# Patient Record
Sex: Male | Born: 1935 | Race: White | Hispanic: No | Marital: Married | Smoking: Former smoker
Health system: Southern US, Community
[De-identification: ages and names within clinical notes are randomized; demographics above are authoritative.]

## PROBLEM LIST (undated history)

## (undated) DIAGNOSIS — K429 Umbilical hernia without obstruction or gangrene: Secondary | ICD-10-CM

## (undated) DIAGNOSIS — G709 Myoneural disorder, unspecified: Secondary | ICD-10-CM

## (undated) DIAGNOSIS — H9190 Unspecified hearing loss, unspecified ear: Secondary | ICD-10-CM

## (undated) DIAGNOSIS — Z972 Presence of dental prosthetic device (complete) (partial): Secondary | ICD-10-CM

## (undated) DIAGNOSIS — I10 Essential (primary) hypertension: Secondary | ICD-10-CM

## (undated) DIAGNOSIS — K219 Gastro-esophageal reflux disease without esophagitis: Secondary | ICD-10-CM

## (undated) DIAGNOSIS — N200 Calculus of kidney: Secondary | ICD-10-CM

## (undated) DIAGNOSIS — M5126 Other intervertebral disc displacement, lumbar region: Secondary | ICD-10-CM

## (undated) DIAGNOSIS — Z87442 Personal history of urinary calculi: Secondary | ICD-10-CM

## (undated) DIAGNOSIS — K08109 Complete loss of teeth, unspecified cause, unspecified class: Secondary | ICD-10-CM

## (undated) DIAGNOSIS — M1990 Unspecified osteoarthritis, unspecified site: Secondary | ICD-10-CM

## (undated) DIAGNOSIS — T8859XA Other complications of anesthesia, initial encounter: Secondary | ICD-10-CM

## (undated) DIAGNOSIS — J189 Pneumonia, unspecified organism: Secondary | ICD-10-CM

## (undated) DIAGNOSIS — E119 Type 2 diabetes mellitus without complications: Secondary | ICD-10-CM

## (undated) DIAGNOSIS — Z973 Presence of spectacles and contact lenses: Secondary | ICD-10-CM

## (undated) DIAGNOSIS — T4145XA Adverse effect of unspecified anesthetic, initial encounter: Secondary | ICD-10-CM

## (undated) HISTORY — PX: CATARACT EXTRACTION W/ INTRAOCULAR LENS  IMPLANT, BILATERAL: SHX1307

## (undated) HISTORY — PX: MULTIPLE TOOTH EXTRACTIONS: SHX2053

## (undated) HISTORY — PX: NOSE SURGERY: SHX723

## (undated) HISTORY — PX: BACK SURGERY: SHX140

## (undated) HISTORY — PX: COLONOSCOPY W/ BIOPSIES AND POLYPECTOMY: SHX1376

## (undated) HISTORY — PX: OTHER SURGICAL HISTORY: SHX169

---

## 2014-04-08 ENCOUNTER — Other Ambulatory Visit: Payer: Self-pay | Admitting: Neurosurgery

## 2014-04-08 DIAGNOSIS — M48061 Spinal stenosis, lumbar region without neurogenic claudication: Secondary | ICD-10-CM

## 2014-04-21 ENCOUNTER — Ambulatory Visit
Admission: RE | Admit: 2014-04-21 | Discharge: 2014-04-21 | Disposition: A | Discharge status: Home / Self Care | Payer: Medicare Other | Source: Ambulatory Visit | Attending: Neurosurgery | Admitting: Neurosurgery

## 2014-04-21 ENCOUNTER — Ambulatory Visit
Admission: RE | Admit: 2014-04-21 | Discharge: 2014-04-21 | Disposition: A | Discharge status: Home / Self Care | Payer: Self-pay | Source: Ambulatory Visit | Attending: Neurosurgery | Admitting: Neurosurgery

## 2014-04-21 DIAGNOSIS — M48061 Spinal stenosis, lumbar region without neurogenic claudication: Secondary | ICD-10-CM

## 2014-04-21 MED ORDER — DIAZEPAM 5 MG PO TABS
5.0000 mg | ORAL_TABLET | Freq: Once | ORAL | Status: AC
  Administered 2014-04-21: 5 mg via ORAL

## 2014-04-21 MED ORDER — IOHEXOL 180 MG/ML  SOLN
15.0000 mL | Freq: Once | INTRAMUSCULAR | Status: AC | PRN
  Administered 2014-04-21: 15 mL via INTRATHECAL

## 2014-04-21 NOTE — Discharge Instructions (Signed)

## 2014-04-30 ENCOUNTER — Other Ambulatory Visit (HOSPITAL_COMMUNITY): Payer: Self-pay | Admitting: Neurosurgery

## 2014-05-14 ENCOUNTER — Encounter (HOSPITAL_COMMUNITY): Payer: Self-pay

## 2014-05-14 ENCOUNTER — Encounter (HOSPITAL_COMMUNITY)
Admission: RE | Admit: 2014-05-14 | Discharge: 2014-05-14 | Disposition: A | Discharge status: Home / Self Care | Payer: Medicare Other | Source: Ambulatory Visit | Attending: Neurosurgery | Admitting: Neurosurgery

## 2014-05-14 ENCOUNTER — Inpatient Hospital Stay (HOSPITAL_COMMUNITY): Admission: RE | Admit: 2014-05-14 | Payer: Medicare Other | Source: Ambulatory Visit | Admitting: Neurosurgery

## 2014-05-14 DIAGNOSIS — Z029 Encounter for administrative examinations, unspecified: Secondary | ICD-10-CM

## 2014-05-14 HISTORY — DX: Umbilical hernia without obstruction or gangrene: K42.9

## 2014-05-14 HISTORY — DX: Unspecified osteoarthritis, unspecified site: M19.90

## 2014-05-14 HISTORY — DX: Myoneural disorder, unspecified: G70.9

## 2014-05-14 HISTORY — DX: Essential (primary) hypertension: I10

## 2014-05-14 HISTORY — DX: Unspecified hearing loss, unspecified ear: H91.90

## 2014-05-14 HISTORY — DX: Calculus of kidney: N20.0

## 2014-05-14 HISTORY — DX: Gastro-esophageal reflux disease without esophagitis: K21.9

## 2014-05-14 HISTORY — DX: Presence of spectacles and contact lenses: Z97.3

## 2014-05-14 LAB — CBC
HCT: 46.8 % (ref 39.0–52.0)
Hemoglobin: 16.2 g/dL (ref 13.0–17.0)
MCH: 32.9 pg (ref 26.0–34.0)
MCHC: 34.6 g/dL (ref 30.0–36.0)
MCV: 94.9 fL (ref 78.0–100.0)
PLATELETS: 232 10*3/uL (ref 150–400)
RBC: 4.93 MIL/uL (ref 4.22–5.81)
RDW: 12.6 % (ref 11.5–15.5)
WBC: 8.6 10*3/uL (ref 4.0–10.5)

## 2014-05-14 LAB — SURGICAL PCR SCREEN
MRSA, PCR: NEGATIVE
STAPHYLOCOCCUS AUREUS: NEGATIVE

## 2014-05-14 LAB — TYPE AND SCREEN
ABO/RH(D): A POS
Antibody Screen: NEGATIVE

## 2014-05-14 LAB — BASIC METABOLIC PANEL
Anion gap: 10 (ref 5–15)
BUN: 23 mg/dL (ref 6–23)
CALCIUM: 9.2 mg/dL (ref 8.4–10.5)
CO2: 25 mmol/L (ref 19–32)
Chloride: 101 mmol/L (ref 96–112)
Creatinine, Ser: 0.77 mg/dL (ref 0.50–1.35)
GFR calc Af Amer: >90 mL/min (ref >90)
GFR calc non Af Amer: 85 mL/min — ABNORMAL LOW (ref >90)
GLUCOSE: 166 mg/dL — AB (ref 70–99)
POTASSIUM: 4 mmol/L (ref 3.5–5.1)
SODIUM: 136 mmol/L (ref 135–145)

## 2014-05-14 LAB — ABO/RH: ABO/RH(D): A POS

## 2014-05-14 NOTE — Progress Notes (Signed)
Pt denies SOB and chest pain but is under the care of Dr. Fransico MichaelLeverne Sobiech of [facility]. Pt denies having a cardiac cath but did have a stress test and echo; records requested. Pt denies having a PCP at this time; unable to make PCP aware of STOP-BANG Sleep Apnea screening results. Pt chart forwarded to [place], [place] ( anesthesia) for review of EKG.

## 2014-05-14 NOTE — Pre-Procedure Instructions (Addendum)
Reginald HolidayJames Santillana  05/14/2014   Your procedure is scheduled on:  Wednesday, May 21, 2014  [facility] Admitting at 9:45 AM.( per MD)  Call this number if you have problems the morning of surgery: [phone]   Remember:   Do not eat food or drink liquids after midnight Tuesday, May 20, 2014   Take these medicines the morning of surgery with A SIP OF WATER: fexofenadine (ALLEGRA),  HYDROcodone-acetaminophen (NORCO/VICODIN), omeprazole (PRILOSEC),  if needed: Acetaminophen (TYLENOL ARTHRITIS PAIN PO)  Stop taking Aspirin, vitamins and herbal medications. Do not take any NSAIDs ie: Ibuprofen, Advil, Naproxen or any medication containing Aspirin; stop now   Do not wear jewelry, make-up or nail polish.  Do not wear lotions, powders, or perfumes. You may not wear deodorant.  Do not shave 48 hours prior to surgery. Men may shave face and neck.  Do not bring valuables to the hospital.  [facility] is not responsible for any belongings or valuables.               Contacts, dentures or bridgework may not be worn into surgery.  Leave suitcase in the car. After surgery it may be brought to your room.  For patients admitted to the hospital, discharge time is determined by your treatment team.               Patients discharged the day of surgery will not be allowed to drive home.  Name and phone number of your driver:   Special Instructions:  Special Instructions:Special Instructions: [facility] - Preparing for Surgery  Before surgery, you can play an important role.  Because skin is not sterile, your skin needs to be as free of germs as possible.  You can reduce the number of germs on you skin by washing with CHG (chlorahexidine gluconate) soap before surgery.  CHG is an antiseptic cleaner which kills germs and bonds with the skin to continue killing germs even after washing.  Please DO NOT use if you have an allergy to CHG or antibacterial soaps.  If your skin becomes  reddened/irritated stop using the CHG and inform your nurse when you arrive at Short Stay.  Do not shave (including legs and underarms) for at least 48 hours prior to the first CHG shower.  You may shave your face.  Please follow these instructions carefully:   1.  Shower with CHG Soap the night before surgery and the morning of Surgery.  2.  If you choose to wash your hair, wash your hair first as usual with your normal shampoo.  3.  After you shampoo, rinse your hair and body thoroughly to remove the Shampoo.  4.  Use CHG as you would any other liquid soap.  You can apply chg directly  to the skin and wash gently with scrungie or a clean washcloth.  5.  Apply the CHG Soap to your body ONLY FROM THE NECK DOWN.  Do not use on open wounds or open sores.  Avoid contact with your eyes, ears, mouth and genitals (private parts).  Wash genitals (private parts) with your normal soap.  6.  Wash thoroughly, paying special attention to the area where your surgery will be performed.  7.  Thoroughly rinse your body with warm water from the neck down.  8.  DO NOT shower/wash with your normal soap after using and rinsing off the CHG Soap.  9.  Pat yourself dry with a clean towel.  10.  Wear clean pajamas.            11.  Place clean sheets on your bed the night of your first shower and do not sleep with pets.  Day of Surgery  Do not apply any lotions/deodorants the morning of surgery.  Please wear clean clothes to the hospital/surgery center.   Please read over the following fact sheets that you were given: Pain Booklet, Coughing and Deep Breathing, Blood Transfusion Information, MRSA Information and Surgical Site Infection Prevention

## 2014-05-14 NOTE — Progress Notes (Signed)
   05/14/14 1327  OBSTRUCTIVE SLEEP APNEA  Have you ever been diagnosed with sleep apnea through a sleep study? No  Do you snore loudly (loud enough to be heard through closed doors)?  0  Do you often feel tired, fatigued, or sleepy during the daytime? 1  Has anyone observed you stop breathing during your sleep? 0  Do you have, or are you being treated for high blood pressure? 1  BMI more than 35 kg/m2? 0  Age over 77 years old? 1  Neck circumference greater than 40 cm/16 inches? 1  Gender: 1

## 2014-05-15 NOTE — Progress Notes (Signed)
Anesthesia Chart Review:  Reginald [name] is 79 year old male scheduled for L4-5 PLIF on 05/21/2014 with Dr. Lovell SheehanJenkins.   Cardiologist is Fransico MichaelLeverne Haverstick at [facility].   PMH includes: HTN, DM, PVD, GERD, hearing deficit. Former smoker. BMI 32.   Preoperative labs reviewed.    EKG 05/13/2014: NSR. LAD. RBBB. Possible lateral infarct, age undetermined. Inferior infarct, age undetermined. Per Dr. Scherrie GerlachFox's interpretation, no significant change since EKG tracing from 10/2013.    Nuclear stress test 11/19/2013: 1. Resting EKG reveals sinus rhythm with PVCs, first degree AV block and intermittent RBBB.  2. The LV EF was calculated at at 44%. Gated SPECT is normal. The EF was >50% by gross visual inpection.  3. Rest and stress perfusion images reveal diaphragm attenuation. This is felt to represent a normal study. There is no evidence for ischemia or prior infarction.   Echo 11/11/2013: 1. The LV size is normal. 2. There is mild concentric LVH 3. Overall LV systolic function is normal with an EF between 60-65% 4. The diastolic filling pattern indicates impaired relaxation.  5. The LA is normal in size by volume.  6. The RA is normal in size and function.  7. There is mild aortic valve sclerosis without stenosis 8. There is trace mitral regurgitation 9. Trace tricuspid regurgitation present.  10. There is no evidence of pulmonary hypertension.   Reginald [name] had stress and echo that were low risk after abnormal EKG last October. Reginald [name] has cardiac clearance for surgery from Dr. Caryn SectionFox.   If no changes, I anticipate Reginald [name] can proceed with surgery as scheduled.   Rica Mastngela Xin Klawitter, FNP-BC [facility]Anesthesiology Phone: [phone] 05/15/2014 1:51 PM

## 2014-05-20 MED ORDER — CEFAZOLIN SODIUM-DEXTROSE 2-3 GM-% IV SOLR
2.0000 g | INTRAVENOUS | Status: AC
  Administered 2014-05-21 (×2): 2 g via INTRAVENOUS
  Filled 2014-05-20: qty 50

## 2014-05-21 ENCOUNTER — Ambulatory Visit (HOSPITAL_COMMUNITY): Payer: Medicare Other

## 2014-05-21 ENCOUNTER — Inpatient Hospital Stay (HOSPITAL_COMMUNITY)
Admission: RE | Admit: 2014-05-21 | Discharge: 2014-05-23 | DRG: 460 | Disposition: A | Discharge status: Home / Self Care | Payer: Medicare Other | Source: Ambulatory Visit | Attending: Neurosurgery | Admitting: Neurosurgery

## 2014-05-21 ENCOUNTER — Ambulatory Visit (HOSPITAL_COMMUNITY): Payer: Medicare Other | Admitting: Emergency Medicine

## 2014-05-21 ENCOUNTER — Encounter (HOSPITAL_COMMUNITY)
Admission: RE | Disposition: A | Discharge status: Home / Self Care | Payer: Medicare Other | Source: Ambulatory Visit | Attending: Neurosurgery

## 2014-05-21 ENCOUNTER — Encounter (HOSPITAL_COMMUNITY): Payer: Self-pay | Admitting: *Deleted

## 2014-05-21 ENCOUNTER — Ambulatory Visit (HOSPITAL_COMMUNITY): Payer: Medicare Other | Admitting: Anesthesiology

## 2014-05-21 DIAGNOSIS — Z885 Allergy status to narcotic agent status: Secondary | ICD-10-CM

## 2014-05-21 DIAGNOSIS — Z7982 Long term (current) use of aspirin: Secondary | ICD-10-CM | POA: Diagnosis not present

## 2014-05-21 DIAGNOSIS — Z87442 Personal history of urinary calculi: Secondary | ICD-10-CM

## 2014-05-21 DIAGNOSIS — Z79891 Long term (current) use of opiate analgesic: Secondary | ICD-10-CM | POA: Diagnosis not present

## 2014-05-21 DIAGNOSIS — G709 Myoneural disorder, unspecified: Secondary | ICD-10-CM | POA: Diagnosis present

## 2014-05-21 DIAGNOSIS — H9190 Unspecified hearing loss, unspecified ear: Secondary | ICD-10-CM | POA: Diagnosis present

## 2014-05-21 DIAGNOSIS — Z79899 Other long term (current) drug therapy: Secondary | ICD-10-CM

## 2014-05-21 DIAGNOSIS — M1990 Unspecified osteoarthritis, unspecified site: Secondary | ICD-10-CM | POA: Diagnosis present

## 2014-05-21 DIAGNOSIS — I10 Essential (primary) hypertension: Secondary | ICD-10-CM | POA: Diagnosis present

## 2014-05-21 DIAGNOSIS — K219 Gastro-esophageal reflux disease without esophagitis: Secondary | ICD-10-CM | POA: Diagnosis present

## 2014-05-21 DIAGNOSIS — Z888 Allergy status to other drugs, medicaments and biological substances status: Secondary | ICD-10-CM

## 2014-05-21 DIAGNOSIS — M4806 Spinal stenosis, lumbar region: Secondary | ICD-10-CM | POA: Diagnosis present

## 2014-05-21 DIAGNOSIS — M5116 Intervertebral disc disorders with radiculopathy, lumbar region: Secondary | ICD-10-CM | POA: Diagnosis present

## 2014-05-21 DIAGNOSIS — Z87891 Personal history of nicotine dependence: Secondary | ICD-10-CM

## 2014-05-21 DIAGNOSIS — M47816 Spondylosis without myelopathy or radiculopathy, lumbar region: Secondary | ICD-10-CM

## 2014-05-21 DIAGNOSIS — M4316 Spondylolisthesis, lumbar region: Secondary | ICD-10-CM | POA: Diagnosis present

## 2014-05-21 HISTORY — DX: Other complications of anesthesia, initial encounter: T88.59XA

## 2014-05-21 HISTORY — DX: Adverse effect of unspecified anesthetic, initial encounter: T41.45XA

## 2014-05-21 SURGERY — POSTERIOR LUMBAR FUSION 1 LEVEL
Anesthesia: General | Site: Spine Lumbar

## 2014-05-21 MED ORDER — DIAZEPAM 5 MG PO TABS
5.0000 mg | ORAL_TABLET | Freq: Four times a day (QID) | ORAL | Status: DC | PRN
  Administered 2014-05-21 – 2014-05-23 (×4): 5 mg via ORAL
  Filled 2014-05-21 (×3): qty 1

## 2014-05-21 MED ORDER — BACITRACIN 50000 UNITS IM SOLR
INTRAMUSCULAR | Status: DC | PRN
  Administered 2014-05-21: 14:00:00

## 2014-05-21 MED ORDER — LACTATED RINGERS IV SOLN
INTRAVENOUS | Status: DC
  Administered 2014-05-21: 50 mL/h via INTRAVENOUS
  Administered 2014-05-21: 19:00:00 via INTRAVENOUS

## 2014-05-21 MED ORDER — QUINAPRIL HCL 10 MG PO TABS
40.0000 mg | ORAL_TABLET | Freq: Every day | ORAL | Status: DC
  Administered 2014-05-21 – 2014-05-23 (×3): 40 mg via ORAL
  Filled 2014-05-21 (×4): qty 4

## 2014-05-21 MED ORDER — EPHEDRINE SULFATE 50 MG/ML IJ SOLN
INTRAMUSCULAR | Status: AC
  Filled 2014-05-21: qty 1

## 2014-05-21 MED ORDER — PANTOPRAZOLE SODIUM 40 MG PO TBEC
80.0000 mg | DELAYED_RELEASE_TABLET | Freq: Every day | ORAL | Status: DC
  Administered 2014-05-21 – 2014-05-23 (×3): 80 mg via ORAL
  Filled 2014-05-21 (×3): qty 2

## 2014-05-21 MED ORDER — NEOSTIGMINE METHYLSULFATE 10 MG/10ML IV SOLN
INTRAVENOUS | Status: AC
  Filled 2014-05-21: qty 1

## 2014-05-21 MED ORDER — BACITRACIN ZINC 500 UNIT/GM EX OINT
TOPICAL_OINTMENT | CUTANEOUS | Status: DC | PRN
  Administered 2014-05-21: 1 via TOPICAL

## 2014-05-21 MED ORDER — GEMFIBROZIL 600 MG PO TABS
600.0000 mg | ORAL_TABLET | Freq: Two times a day (BID) | ORAL | Status: DC
  Administered 2014-05-22 – 2014-05-23 (×3): 600 mg via ORAL
  Filled 2014-05-21 (×5): qty 1

## 2014-05-21 MED ORDER — LACTATED RINGERS IV SOLN
INTRAVENOUS | Status: DC | PRN
  Administered 2014-05-21 (×5): via INTRAVENOUS

## 2014-05-21 MED ORDER — ALUM & MAG HYDROXIDE-SIMETH 200-200-20 MG/5ML PO SUSP: 30.0000 mL | Freq: Four times a day (QID) | ORAL | Status: DC | PRN

## 2014-05-21 MED ORDER — BUPIVACAINE-EPINEPHRINE (PF) 0.5% -1:200000 IJ SOLN
INTRAMUSCULAR | Status: DC | PRN
  Administered 2014-05-21: 10 mL

## 2014-05-21 MED ORDER — SUFENTANIL CITRATE 50 MCG/ML IV SOLN
INTRAVENOUS | Status: DC | PRN
  Administered 2014-05-21 (×2): 5 ug via INTRAVENOUS
  Administered 2014-05-21: 10 ug via INTRAVENOUS
  Administered 2014-05-21: 5 ug via INTRAVENOUS
  Administered 2014-05-21: 15 ug via INTRAVENOUS
  Administered 2014-05-21 (×2): 5 ug via INTRAVENOUS

## 2014-05-21 MED ORDER — GLYCOPYRROLATE 0.2 MG/ML IJ SOLN
INTRAMUSCULAR | Status: DC | PRN
  Administered 2014-05-21: .4 mg via INTRAVENOUS

## 2014-05-21 MED ORDER — SUFENTANIL CITRATE 50 MCG/ML IV SOLN
INTRAVENOUS | Status: AC
  Filled 2014-05-21: qty 1

## 2014-05-21 MED ORDER — VANCOMYCIN HCL 1000 MG IV SOLR
INTRAVENOUS | Status: DC | PRN
  Administered 2014-05-21: 1000 mg via TOPICAL

## 2014-05-21 MED ORDER — PHENYLEPHRINE HCL 10 MG/ML IJ SOLN
10.0000 mg | INTRAVENOUS | Status: DC | PRN
  Administered 2014-05-21: 80 ug/min via INTRAVENOUS
  Administered 2014-05-21: 40 ug/min via INTRAVENOUS

## 2014-05-21 MED ORDER — ONDANSETRON HCL 4 MG/2ML IJ SOLN
INTRAMUSCULAR | Status: DC | PRN
  Administered 2014-05-21: 4 mg via INTRAVENOUS

## 2014-05-21 MED ORDER — FENTANYL CITRATE (PF) 100 MCG/2ML IJ SOLN
INTRAMUSCULAR | Status: DC | PRN
  Administered 2014-05-21 (×2): 50 ug via INTRAVENOUS

## 2014-05-21 MED ORDER — ONDANSETRON HCL 4 MG/2ML IJ SOLN: 4.0000 mg | INTRAMUSCULAR | Status: DC | PRN

## 2014-05-21 MED ORDER — BUPIVACAINE LIPOSOME 1.3 % IJ SUSP
INTRAMUSCULAR | Status: DC | PRN
  Administered 2014-05-21: 20 mL

## 2014-05-21 MED ORDER — ACETAMINOPHEN 650 MG RE SUPP: 650.0000 mg | RECTAL | Status: DC | PRN

## 2014-05-21 MED ORDER — FENTANYL CITRATE (PF) 250 MCG/5ML IJ SOLN
INTRAMUSCULAR | Status: AC
  Filled 2014-05-21: qty 5

## 2014-05-21 MED ORDER — ONDANSETRON HCL 4 MG/2ML IJ SOLN
4.0000 mg | Freq: Once | INTRAMUSCULAR | Status: AC | PRN
  Administered 2014-05-21: 4 mg via INTRAVENOUS

## 2014-05-21 MED ORDER — LORATADINE 10 MG PO TABS
10.0000 mg | ORAL_TABLET | Freq: Every day | ORAL | Status: DC
  Administered 2014-05-21 – 2014-05-23 (×3): 10 mg via ORAL
  Filled 2014-05-21 (×3): qty 1

## 2014-05-21 MED ORDER — SODIUM CHLORIDE 0.9 % IJ SOLN
INTRAMUSCULAR | Status: AC
  Filled 2014-05-21: qty 10

## 2014-05-21 MED ORDER — HYDROMORPHONE HCL 1 MG/ML IJ SOLN
INTRAMUSCULAR | Status: AC
  Filled 2014-05-21: qty 1

## 2014-05-21 MED ORDER — ALBUMIN HUMAN 5 % IV SOLN
INTRAVENOUS | Status: DC | PRN
  Administered 2014-05-21 (×2): via INTRAVENOUS

## 2014-05-21 MED ORDER — ACETAMINOPHEN 10 MG/ML IV SOLN
INTRAVENOUS | Status: AC
  Administered 2014-05-21: 1000 mg
  Filled 2014-05-21: qty 100

## 2014-05-21 MED ORDER — 0.9 % SODIUM CHLORIDE (POUR BTL) OPTIME
TOPICAL | Status: DC | PRN
  Administered 2014-05-21: 1000 mL

## 2014-05-21 MED ORDER — LIDOCAINE HCL (CARDIAC) 20 MG/ML IV SOLN
INTRAVENOUS | Status: AC
  Filled 2014-05-21: qty 5

## 2014-05-21 MED ORDER — ONDANSETRON HCL 4 MG/2ML IJ SOLN
INTRAMUSCULAR | Status: AC
  Filled 2014-05-21: qty 2

## 2014-05-21 MED ORDER — SURGIFOAM 100 EX MISC
CUTANEOUS | Status: DC | PRN
  Administered 2014-05-21: 15:00:00 via TOPICAL

## 2014-05-21 MED ORDER — LIDOCAINE HCL (CARDIAC) 20 MG/ML IV SOLN
INTRAVENOUS | Status: DC | PRN
  Administered 2014-05-21: 40 mg via INTRAVENOUS

## 2014-05-21 MED ORDER — PROPOFOL 10 MG/ML IV BOLUS
INTRAVENOUS | Status: AC
  Filled 2014-05-21: qty 20

## 2014-05-21 MED ORDER — BUPIVACAINE LIPOSOME 1.3 % IJ SUSP
20.0000 mL | INTRAMUSCULAR | Status: AC
  Filled 2014-05-21: qty 20

## 2014-05-21 MED ORDER — SUCCINYLCHOLINE CHLORIDE 20 MG/ML IJ SOLN
INTRAMUSCULAR | Status: AC
  Filled 2014-05-21: qty 1

## 2014-05-21 MED ORDER — PROPOFOL 10 MG/ML IV BOLUS
INTRAVENOUS | Status: DC | PRN
  Administered 2014-05-21: 150 mg via INTRAVENOUS

## 2014-05-21 MED ORDER — ROCURONIUM BROMIDE 100 MG/10ML IV SOLN
INTRAVENOUS | Status: DC | PRN
  Administered 2014-05-21 (×4): 10 mg via INTRAVENOUS
  Administered 2014-05-21: 50 mg via INTRAVENOUS

## 2014-05-21 MED ORDER — ACETAMINOPHEN 325 MG PO TABS: 650.0000 mg | ORAL_TABLET | ORAL | Status: DC | PRN

## 2014-05-21 MED ORDER — NEOSTIGMINE METHYLSULFATE 10 MG/10ML IV SOLN
INTRAVENOUS | Status: DC | PRN
  Administered 2014-05-21: 3 mg via INTRAVENOUS

## 2014-05-21 MED ORDER — CEFAZOLIN SODIUM-DEXTROSE 2-3 GM-% IV SOLR
2.0000 g | Freq: Three times a day (TID) | INTRAVENOUS | Status: AC
  Administered 2014-05-21 – 2014-05-22 (×2): 2 g via INTRAVENOUS
  Filled 2014-05-21 (×2): qty 50

## 2014-05-21 MED ORDER — MONTELUKAST SODIUM 10 MG PO TABS
10.0000 mg | ORAL_TABLET | Freq: Every day | ORAL | Status: DC
  Administered 2014-05-21 – 2014-05-22 (×2): 10 mg via ORAL
  Filled 2014-05-21 (×3): qty 1

## 2014-05-21 MED ORDER — CHLORTHALIDONE 25 MG PO TABS
25.0000 mg | ORAL_TABLET | Freq: Every day | ORAL | Status: DC
  Administered 2014-05-21 – 2014-05-23 (×3): 25 mg via ORAL
  Filled 2014-05-21 (×3): qty 1

## 2014-05-21 MED ORDER — HYDROMORPHONE HCL 1 MG/ML IJ SOLN
0.2500 mg | INTRAMUSCULAR | Status: DC | PRN
  Administered 2014-05-21: 0.5 mg via INTRAVENOUS

## 2014-05-21 MED ORDER — ONDANSETRON HCL 4 MG/2ML IJ SOLN
INTRAMUSCULAR | Status: AC
  Administered 2014-05-21: 4 mg via INTRAVENOUS
  Filled 2014-05-21: qty 2

## 2014-05-21 MED ORDER — ROCURONIUM BROMIDE 50 MG/5ML IV SOLN
INTRAVENOUS | Status: AC
  Filled 2014-05-21: qty 2

## 2014-05-21 MED ORDER — DIAZEPAM 5 MG PO TABS
ORAL_TABLET | ORAL | Status: AC
  Administered 2014-05-21: 5 mg via ORAL
  Filled 2014-05-21: qty 1

## 2014-05-21 MED ORDER — PHENOL 1.4 % MT LIQD: 1.0000 | OROMUCOSAL | Status: DC | PRN

## 2014-05-21 MED ORDER — CHLORZOXAZONE 500 MG PO TABS
500.0000 mg | ORAL_TABLET | Freq: Two times a day (BID) | ORAL | Status: DC
  Administered 2014-05-21 – 2014-05-23 (×4): 500 mg via ORAL
  Filled 2014-05-21 (×5): qty 1

## 2014-05-21 MED ORDER — GLYCOPYRROLATE 0.2 MG/ML IJ SOLN
INTRAMUSCULAR | Status: AC
  Filled 2014-05-21: qty 2

## 2014-05-21 MED ORDER — LACTATED RINGERS IV SOLN: INTRAVENOUS | Status: DC

## 2014-05-21 MED ORDER — MORPHINE SULFATE 2 MG/ML IJ SOLN
1.0000 mg | INTRAMUSCULAR | Status: DC | PRN
  Administered 2014-05-21: 2 mg via INTRAVENOUS
  Administered 2014-05-22: 4 mg via INTRAVENOUS
  Filled 2014-05-21: qty 1
  Filled 2014-05-21: qty 2

## 2014-05-21 MED ORDER — HYDROMORPHONE HCL 2 MG PO TABS
4.0000 mg | ORAL_TABLET | ORAL | Status: DC | PRN
  Administered 2014-05-21 – 2014-05-23 (×6): 4 mg via ORAL
  Administered 2014-05-23: 2 mg via ORAL
  Filled 2014-05-21 (×7): qty 2

## 2014-05-21 MED ORDER — VANCOMYCIN HCL 1000 MG IV SOLR
INTRAVENOUS | Status: AC
  Filled 2014-05-21: qty 1000

## 2014-05-21 MED ORDER — MENTHOL 3 MG MT LOZG: 1.0000 | LOZENGE | OROMUCOSAL | Status: DC | PRN

## 2014-05-21 SURGICAL SUPPLY — 70 items
BAG DECANTER FOR FLEXI CONT (MISCELLANEOUS) ×3 IMPLANT
BENZOIN TINCTURE PRP APPL 2/3 (GAUZE/BANDAGES/DRESSINGS) ×3 IMPLANT
BLADE CLIPPER SURG (BLADE) ×3 IMPLANT
BRUSH SCRUB EZ PLAIN DRY (MISCELLANEOUS) ×3 IMPLANT
BUR MATCHSTICK NEURO 3.0 LAGG (BURR) ×3 IMPLANT
BUR PRECISION FLUTE 6.0 (BURR) ×3 IMPLANT
CANISTER SUCT 3000ML PPV (MISCELLANEOUS) ×3 IMPLANT
CAP REVERE LOCKING (Cap) ×12 IMPLANT
CLOSURE WOUND 1/2 X4 (GAUZE/BANDAGES/DRESSINGS) ×1
CONT SPEC 4OZ CLIKSEAL STRL BL (MISCELLANEOUS) ×3 IMPLANT
COVER BACK TABLE 60X90IN (DRAPES) ×3 IMPLANT
DRAPE C-ARM 42X72 X-RAY (DRAPES) ×6 IMPLANT
DRAPE LAPAROTOMY 100X72X124 (DRAPES) ×3 IMPLANT
DRAPE POUCH INSTRU U-SHP 10X18 (DRAPES) ×3 IMPLANT
DRAPE PROXIMA HALF (DRAPES) ×3 IMPLANT
DRAPE SURG 17X23 STRL (DRAPES) ×12 IMPLANT
ELECT BLADE 4.0 EZ CLEAN MEGAD (MISCELLANEOUS) ×3
ELECT REM PT RETURN 9FT ADLT (ELECTROSURGICAL) ×3
ELECTRODE BLDE 4.0 EZ CLN MEGD (MISCELLANEOUS) ×1 IMPLANT
ELECTRODE REM PT RTRN 9FT ADLT (ELECTROSURGICAL) ×1 IMPLANT
EVACUATOR 1/8 PVC DRAIN (DRAIN) IMPLANT
GAUZE SPONGE 4X4 12PLY STRL (GAUZE/BANDAGES/DRESSINGS) ×3 IMPLANT
GAUZE SPONGE 4X4 16PLY XRAY LF (GAUZE/BANDAGES/DRESSINGS) ×3 IMPLANT
GLOVE BIO SURGEON STRL SZ 6.5 (GLOVE) ×6 IMPLANT
GLOVE BIO SURGEON STRL SZ7 (GLOVE) ×6 IMPLANT
GLOVE BIO SURGEON STRL SZ8 (GLOVE) ×12 IMPLANT
GLOVE BIO SURGEON STRL SZ8.5 (GLOVE) ×9 IMPLANT
GLOVE BIO SURGEONS STRL SZ 6.5 (GLOVE) ×3
GLOVE BIOGEL PI IND STRL 7.0 (GLOVE) ×2 IMPLANT
GLOVE BIOGEL PI IND STRL 7.5 (GLOVE) ×2 IMPLANT
GLOVE BIOGEL PI INDICATOR 7.0 (GLOVE) ×4
GLOVE BIOGEL PI INDICATOR 7.5 (GLOVE) ×4
GLOVE ECLIPSE 7.5 STRL STRAW (GLOVE) ×9 IMPLANT
GLOVE EXAM NITRILE LRG STRL (GLOVE) IMPLANT
GLOVE EXAM NITRILE MD LF STRL (GLOVE) IMPLANT
GLOVE EXAM NITRILE XL STR (GLOVE) IMPLANT
GLOVE EXAM NITRILE XS STR PU (GLOVE) IMPLANT
GOWN STRL REUS W/ TWL LRG LVL3 (GOWN DISPOSABLE) IMPLANT
GOWN STRL REUS W/ TWL XL LVL3 (GOWN DISPOSABLE) ×4 IMPLANT
GOWN STRL REUS W/TWL 2XL LVL3 (GOWN DISPOSABLE) IMPLANT
GOWN STRL REUS W/TWL LRG LVL3 (GOWN DISPOSABLE)
GOWN STRL REUS W/TWL XL LVL3 (GOWN DISPOSABLE) ×8
KIT BASIN OR (CUSTOM PROCEDURE TRAY) ×3 IMPLANT
KIT ROOM TURNOVER OR (KITS) ×3 IMPLANT
NEEDLE HYPO 21X1.5 SAFETY (NEEDLE) IMPLANT
NEEDLE HYPO 22GX1.5 SAFETY (NEEDLE) ×3 IMPLANT
NS IRRIG 1000ML POUR BTL (IV SOLUTION) ×3 IMPLANT
PACK LAMINECTOMY NEURO (CUSTOM PROCEDURE TRAY) ×3 IMPLANT
PAD ARMBOARD 7.5X6 YLW CONV (MISCELLANEOUS) ×9 IMPLANT
PATTIES SURGICAL .5 X1 (DISPOSABLE) IMPLANT
PATTIES SURGICAL 1X1 (DISPOSABLE) ×3 IMPLANT
ROD REVERE 6.35 40MM (Rod) ×6 IMPLANT
SCREW REVERE 6.35 75X55MM (Screw) ×12 IMPLANT
SPACER ALTERA 10X31-8 (Spacer) ×3 IMPLANT
SPONGE LAP 4X18 X RAY DECT (DISPOSABLE) IMPLANT
SPONGE NEURO XRAY DETECT 1X3 (DISPOSABLE) IMPLANT
SPONGE SURGIFOAM ABS GEL 100 (HEMOSTASIS) ×3 IMPLANT
STRIP BIOACTIVE 10CC 25X100X4 (Miscellaneous) ×3 IMPLANT
STRIP BIOACTIVE 10CC 25X50X8 (Miscellaneous) ×6 IMPLANT
STRIP CLOSURE SKIN 1/2X4 (GAUZE/BANDAGES/DRESSINGS) ×2 IMPLANT
SUT VIC AB 1 CT1 18XBRD ANBCTR (SUTURE) ×2 IMPLANT
SUT VIC AB 1 CT1 8-18 (SUTURE) ×4
SUT VIC AB 2-0 CP2 18 (SUTURE) ×6 IMPLANT
SYR 20CC LL (SYRINGE) IMPLANT
SYR 20ML ECCENTRIC (SYRINGE) ×3 IMPLANT
TAPE CLOTH SURG 4X10 WHT LF (GAUZE/BANDAGES/DRESSINGS) ×3 IMPLANT
TOWEL OR 17X24 6PK STRL BLUE (TOWEL DISPOSABLE) ×3 IMPLANT
TOWEL OR 17X26 10 PK STRL BLUE (TOWEL DISPOSABLE) ×3 IMPLANT
TRAY FOLEY CATH 16FRSI W/METER (SET/KITS/TRAYS/PACK) ×3 IMPLANT
WATER STERILE IRR 1000ML POUR (IV SOLUTION) ×3 IMPLANT

## 2014-05-21 NOTE — Anesthesia Preprocedure Evaluation (Signed)
Anesthesia Evaluation  Patient identified by MRN, date of birth, ID band Patient awake    Reviewed: Allergy & Precautions, NPO status , Patient's Chart, lab work & pertinent test results  Airway Mallampati: II  TM Distance: >3 FB Neck ROM: Full    Dental  (+) Edentulous Upper, Edentulous Lower   Pulmonary former smoker,  breath sounds clear to auscultation        Cardiovascular hypertension, Rhythm:Regular     Neuro/Psych    GI/Hepatic   Endo/Other    Renal/GU      Musculoskeletal   Abdominal   Peds  Hematology   Anesthesia Other Findings   Reproductive/Obstetrics                             Anesthesia Physical Anesthesia Plan  ASA: II  Anesthesia Plan: General   Post-op Pain Management:    Induction: Intravenous  Airway Management Planned: Oral ETT  Additional Equipment:   Intra-op Plan:   Post-operative Plan:   Informed Consent: I have reviewed the patients History and Physical, chart, labs and discussed the procedure including the risks, benefits and alternatives for the proposed anesthesia with the patient or authorized representative who has indicated his/her understanding and acceptance.   Dental advisory given  Plan Discussed with: CRNA and Anesthesiologist  Anesthesia Plan Comments:         Anesthesia Quick Evaluation

## 2014-05-21 NOTE — Progress Notes (Signed)
Subjective:  The patient is somnolent but easily arousable. He is in no apparent distress. He looks well.  Objective: Vital signs in last 24 hours: Temp:  [97.5 F (36.4 C)] 97.5 F (36.4 C) (05/04 0934) Pulse Rate:  [89] 89 (05/04 0934) Resp:  [20] 20 (05/04 0934) BP: (173)/(82) 173/82 mmHg (05/04 0934) SpO2:  [95 %] 95 % (05/04 0934) Weight:  [94.405 kg (208 lb 2 oz)] 94.405 kg (208 lb 2 oz) (05/04 0934)  Intake/Output from previous day:   Intake/Output this shift: Total I/O In: 3200 [I.V.:2700; IV Piggyback:500] Out: 850 [Urine:400; Blood:450]  Physical exam the patient is somewhat but arousable. He is moving his lower extremities well.  Lab Results: No results for input(s): WBC, HGB, HCT, PLT in the last 72 hours. BMET No results for input(s): NA, K, CL, CO2, GLUCOSE, BUN, CREATININE, CALCIUM in the last 72 hours.  Studies/Results: Dg Lumbar Spine 2-3 Views  05/21/2014   CLINICAL DATA:  L4-L5 PLIF.  Initial encounter.  EXAM: DG C-ARM 61-120 MIN; LUMBAR SPINE - 2-3 VIEW  COMPARISON:  Intraoperative radiograph same date. Lumbar myelogram CT 04/21/2014.  FLUOROSCOPY TIME:  C-arm fluoroscopic images were obtained intraoperatively and submitted for post operative interpretation. Please see the performing provider's procedural report for the fluoroscopy time utilized.  FINDINGS: Two spot images of the lower lumbar spine are submitted from the operating room. Based on prior numbering, there is a transitional S1 segment. These views demonstrate the placement of bilateral pedicle screws and a prosthetic disc at L4-5. The interconnecting rods have not yet been placed.  IMPRESSION: Intraoperative views during L4-5 fusion. No demonstrated complication.   Electronically Signed   By: Carey BullocksWilliam  Veazey M.D.   On: 05/21/2014 17:35   Dg Lumbar Spine 1 View  05/21/2014   CLINICAL DATA:  Intraoperative localization for L4-5 PLIF  EXAM: LUMBAR SPINE - 1 VIEW  COMPARISON:  04/21/2014  FINDINGS: Lateral  radiograph of the lumbar spine was performed intraoperatively and reveals a surgical retractor and instrument at the posterior elements of L4 superiorly.  IMPRESSION: Intraoperative localization at L4   Electronically Signed   By: Alcide CleverMark  Lukens M.D.   On: 05/21/2014 16:34   Dg C-arm 1-60 Min  05/21/2014   CLINICAL DATA:  L4-L5 PLIF.  Initial encounter.  EXAM: DG C-ARM 61-120 MIN; LUMBAR SPINE - 2-3 VIEW  COMPARISON:  Intraoperative radiograph same date. Lumbar myelogram CT 04/21/2014.  FLUOROSCOPY TIME:  C-arm fluoroscopic images were obtained intraoperatively and submitted for post operative interpretation. Please see the performing provider's procedural report for the fluoroscopy time utilized.  FINDINGS: Two spot images of the lower lumbar spine are submitted from the operating room. Based on prior numbering, there is a transitional S1 segment. These views demonstrate the placement of bilateral pedicle screws and a prosthetic disc at L4-5. The interconnecting rods have not yet been placed.  IMPRESSION: Intraoperative views during L4-5 fusion. No demonstrated complication.   Electronically Signed   By: Carey BullocksWilliam  Veazey M.D.   On: 05/21/2014 17:35    Assessment/Plan: The patient is doing well. I spoke with his wife and son.      Kiandra Sanguinetti D 05/21/2014, 6:04 PM

## 2014-05-21 NOTE — Transfer of Care (Signed)
Immediate Anesthesia Transfer of Care Note  Patient: Reginald [name]  Procedure(s) Performed: Procedure(s) with comments: LUMBAR FOUR-FIVE POSTERIOR LUMBAR FUSION 1 LEVEL (N/A) - L45 posterior lumbar interbody fusion with interbody prosthesis posterior lateral arthrodesis and posterior nonsegmental instrumentation  Patient Location: PACU  Anesthesia Type:General  Level of Consciousness: awake, sedated and patient cooperative  Airway & Oxygen Therapy: Patient Spontanous Breathing and Patient connected to face mask oxygen  Post-op Assessment: Report given to RN, Post -op Vital signs reviewed and stable, Patient moving all extremities and Patient moving all extremities X 4  Post vital signs: Reviewed and stable  Last Vitals:  Filed Vitals:   05/21/14 0934  BP: 173/82  Pulse: 89  Temp: 36.4 C  Resp: 20    Complications: No apparent anesthesia complications

## 2014-05-21 NOTE — Anesthesia Procedure Notes (Signed)
Procedure Name: Intubation Date/Time: 05/21/2014 1:10 PM Performed by: Coralee RudFLORES, Penda Venturi Pre-anesthesia Checklist: Patient identified, Emergency Drugs available, Suction available and Patient being monitored Patient Re-evaluated:Patient Re-evaluated prior to inductionOxygen Delivery Method: Circle system utilized Preoxygenation: Pre-oxygenation with 100% oxygen Intubation Type: IV induction Ventilation: Mask ventilation without difficulty Laryngoscope Size: Mac and 4 Grade View: Grade I Tube type: Oral Tube size: 8.0 mm Number of attempts: 1 Airway Equipment and Method: Stylet Placement Confirmation: ETT inserted through vocal cords under direct vision,  positive ETCO2 and breath sounds checked- equal and bilateral Secured at: 22 cm Tube secured with: Tape Dental Injury: Teeth and Oropharynx as per pre-operative assessment

## 2014-05-21 NOTE — H&P (Signed)
Subjective: The patient is a 79 year old white male who has complained of back and leg pain consistent with neurogenic claudication. He has failed medical management and was worked up with a lumbar myelo CT. This demonstrated an L4-5 spondylolisthesis and spinal stenosis. I discussed the various treatment options with the patient including surgery. He has weighed the risks, benefits, and alternative surgery and decided proceed with an L4-5 decompression, instrumentation, and fusion.   Past Medical History  Diagnosis Date  . Hypertension   . GERD (gastroesophageal reflux disease)   . Wears glasses   . Hearing deficit     does not wear H/A  . Kidney stone   . Umbilical hernia   . Arthritis   . Neuromuscular disorder   . Complication of anesthesia     2014 BP DROPPED S/P BACK SURGERY REC. IV FLUIDS    Past Surgical History  Procedure Laterality Date  . Nose surgery      deviated septum, and blockage  . Rottor cuff repair       left   . Cataract extraction w/ intraocular lens  implant, bilateral    . Back surgery    . Colonoscopy w/ biopsies and polypectomy    . Multiple tooth extractions      Allergies  Allergen Reactions  . Acetaminophen Itching and Rash    Patient is tolerating Hydrocodone without difficulty.  . Naproxen Itching and Rash  . Roxicet [Oxycodone-Acetaminophen] Itching and Rash    History  Substance Use Topics  . Smoking status: Former Smoker    Types: Cigarettes  . Smokeless tobacco: Former NeurosurgeonUser    Types: Chew     Comment: Quit smoking  in 1987  . Alcohol Use: Yes    Family History  Problem Relation Age of Onset  . Stroke Mother   . Heart disease Father   . Aneurysm Father   . Aplastic anemia Sister   . Heart disease Other    Prior to Admission medications   Medication Sig Start Date End Date Taking? Authorizing Provider  Acetaminophen (TYLENOL ARTHRITIS PAIN PO) Take 600 mg by mouth 3 (three) times daily as needed (pain).   Yes Historical  Provider, MD  chlorthalidone (HYGROTON) 25 MG tablet Take 25 mg by mouth daily.   Yes Historical Provider, MD  Chlorzoxazone (LORZONE PO) Take 500 mg by mouth 2 (two) times daily.   Yes Historical Provider, MD  fexofenadine (ALLEGRA) 180 MG tablet Take 180 mg by mouth daily.   Yes Historical Provider, MD  gemfibrozil (LOPID) 600 MG tablet Take 600 mg by mouth 2 (two) times daily before a meal.   Yes Historical Provider, MD  HYDROcodone-acetaminophen (NORCO/VICODIN) 5-325 MG per tablet Take 1 tablet by mouth 3 (three) times daily.   Yes Historical Provider, MD  montelukast (SINGULAIR) 10 MG tablet Take 10 mg by mouth at bedtime.   Yes Historical Provider, MD  omeprazole (PRILOSEC) 40 MG capsule Take 40 mg by mouth daily.   Yes Historical Provider, MD  quinapril (ACCUPRIL) 40 MG tablet Take 40 mg by mouth daily.   Yes Historical Provider, MD  aspirin EC 81 MG tablet Take 81 mg by mouth daily.    Historical Provider, MD     Review of Systems  Positive ROS: As above  All other systems have been reviewed and were otherwise negative with the exception of those mentioned in the HPI and as above.  Objective: Vital signs in last 24 hours: Temp:  [97.5 F (36.4 C)] 97.5  F (36.4 C) (05/04 0934) Pulse Rate:  [89] 89 (05/04 0934) Resp:  [20] 20 (05/04 0934) BP: (173)/(82) 173/82 mmHg (05/04 0934) SpO2:  [95 %] 95 % (05/04 0934) Weight:  [94.405 kg (208 lb 2 oz)] 94.405 kg (208 lb 2 oz) (05/04 0934)  General Appearance: Alert, cooperative, no distress, Head: Normocephalic, without obvious abnormality, atraumatic Eyes: PERRL, conjunctiva/corneas clear, EOM's intact,    Ears: Normal  Throat: Normal  Neck: Supple, symmetrical, trachea midline, no adenopathy; thyroid: No enlargement/tenderness/nodules; no carotid bruit or JVD Back: Symmetric, no curvature, ROM normal, no CVA tenderness Lungs: Clear to auscultation bilaterally, respirations unlabored Heart: Regular rate and rhythm, no murmur, rub  or gallop Abdomen: Soft, non-tender,, no masses, no organomegaly Extremities: Extremities normal, atraumatic, no cyanosis or edema Pulses: 2+ and symmetric all extremities Skin: Skin color, texture, turgor normal, no rashes or lesions  NEUROLOGIC:   Mental status: alert and oriented, no aphasia, good attention span, Fund of knowledge/ memory ok Motor Exam - grossly normal Sensory Exam - grossly normal Reflexes:  Coordination - grossly normal Gait - grossly normal Balance - grossly normal Cranial Nerves: I: smell Not tested  II: visual acuity  OS: Normal  OD: Normal   II: visual fields Full to confrontation  II: pupils Equal, round, reactive to light  III,VII: ptosis None  III,IV,VI: extraocular muscles  Full ROM  V: mastication Normal  V: facial light touch sensation  Normal  V,VII: corneal reflex  Present  VII: facial muscle function - upper  Normal  VII: facial muscle function - lower Normal  VIII: hearing Not tested  IX: soft palate elevation  Normal  IX,X: gag reflex Present  XI: trapezius strength  5/5  XI: sternocleidomastoid strength 5/5  XI: neck flexion strength  5/5  XII: tongue strength  Normal    Data Review Lab Results  Component Value Date   WBC 8.6 05/14/2014   HGB 16.2 05/14/2014   HCT 46.8 05/14/2014   MCV 94.9 05/14/2014   PLT 232 05/14/2014   Lab Results  Component Value Date   NA 136 05/14/2014   K 4.0 05/14/2014   CL 101 05/14/2014   CO2 25 05/14/2014   BUN 23 05/14/2014   CREATININE 0.77 05/14/2014   GLUCOSE 166* 05/14/2014   No results found for: INR, PROTIME  Assessment/Plan: L4-5 spondylolisthesis, spinal stenosis, lumbago, lumbar radiculopathy, neurogenic claudication: I have discussed the situation with the patient. I have reviewed his imaging studies with him and pointed out the abnormalities. We have discussed the various treatment options including surgery. I have described the surgical treatment option of an L4-5 decompression,  instrumentation, and fusion. I have shown him surgical models. We have discussed the risks, benefits, alternatives, and likelihood of achieving goals with surgery. I have answered all the patient's questions. He has decided to proceed with surgery.   Tressie StalkerJENKINS,Rossi Burdo D 05/21/2014 12:47 PM

## 2014-05-21 NOTE — Op Note (Signed)
Brief history: The patient is a [age] year old white male who's had chronic back and leg pain. He's had a prior L4 laminectomy by another surgeon. He has had persistent and worsening back and leg pain. He has failed medical management and was worked up with a lumbar myelo CT. This demonstrated multilevel degenerative changes with stenosis most prominent at L4-5 and the spondylolisthesis. I discussed the various treatment options with the patient including surgery. He has weighed the risks, benefits, and alternative surgery and decided proceed with a L4-5 decompression, instrumentation, and fusion.  Preoperative diagnosis: L4-5 spondylolisthesis, Degenerative disc disease, spinal stenosis compressing both the L4 and the L5 nerve roots; lumbago; lumbar radiculopathy  Postoperative diagnosis: The same  Procedure: Redo L4 laminectomy/foraminotomies to decompress the bilateral L4 and L5 nerve roots(the work required to do this was in addition to the work required to do the posterior lumbar interbody fusion because of the patient's spinal stenosis, facet arthropathy. Etc. requiring a wide decompression of the nerve roots.); L4-5 posterior lumbar interbody fusion with local morselized autograft bone and Kinnex graft extender; insertion of interbody prosthesis at L4-5 (globus peek expandable interbody prosthesis); posterior nonsegmental instrumentation from L4 to L5 with globus titanium pedicle screws and rods; posterior lateral arthrodesis at L4-5 with local morselized autograft bone and Kinnex bone graft extender.  Surgeon: Dr. Delma OfficerJeff Maxmillian Carsey  Asst.: Dr. Daryl EasternGary Kram  Anesthesia: Gen. endotracheal  Estimated blood loss: 300 mL  Drains: One medium Hemovac  Complications: None  Description of procedure: The patient was brought to the operating room by the anesthesia team. General endotracheal anesthesia was induced. The patient was turned to the prone position on the Wilson frame. The patient's lumbosacral  region was then prepared with Betadine scrub and Betadine solution. Sterile drapes were applied.  I then injected the area to be incised with Marcaine with epinephrine solution. I then used the scalpel to make a linear midline incision over the L4-5 interspace, incising through the old surgical scar. I then used electrocautery to perform a bilateral subperiosteal dissection exposing the spinous process and lamina of L3, remainder of the L4 lamina, and L5. We then obtained intraoperative radiograph to confirm our location. We then inserted the Verstrac retractor to provide exposure.  I began the decompression by using the high speed drill to widen the prior L4 laminectomy. We then carefully dissected through the epidural scar tissue at L4. We used the Kerrison punches to remove the medial facets at L4-5. We performed wide foraminotomies about the bilateral L4 and L5 nerve roots completing the decompression.  We now turned our attention to the posterior lumbar interbody fusion. I used a scalpel to incise the intervertebral disc at L4-5 bilaterally. I then performed a partial intervertebral discectomy at L4-5 bilaterally using the pituitary forceps. We prepared the vertebral endplates at L4-5 bilaterally for the fusion by removing the soft tissues with the curettes. We then used the trial spacers to pick the appropriate sized interbody prosthesis. We prefilled his prosthesis with a combination of local morselized autograft bone that we obtained during the decompression as well as Kinnex bone graft extender. We inserted the prefilled prosthesis into the interspace at L4-5. There was a good snug fit of the prosthesis in the interspace. We then filled and the remainder of the intervertebral disc space with local morselized autograft bone and Kinnex. This completed the posterior lumbar interbody arthrodesis.  We now turned attention to the instrumentation. Under fluoroscopic guidance we cannulated the bilateral L4  and L5 pedicles with the  bone probe. We then removed the bone probe. We then tapped the pedicle with a 6.5 millimeter tap. We then removed the tap. We probed inside the tapped pedicle with a ball probe to rule out cortical breaches. We then inserted a 7.5 x 55 millimeter pedicle screw into the L4 and L5 pedicles bilaterally under fluoroscopic guidance. We then palpated along the medial aspect of the pedicles to rule out cortical breaches. There were none. The nerve roots were not injured. We then connected the unilateral pedicle screws with a lordotic rod. We compressed the construct and secured the rod in place with the caps. We then tightened the caps appropriately. This completed the instrumentation from L4-5.  We now turned our attention to the posterior lateral arthrodesis at L4-5. We used the high-speed drill to decorticate the remainder of the facets, pars, transverse process at L4-5. We then applied a combination of local morselized autograft bone and Kinnex bone graft extender over these decorticated posterior lateral structures. This completed the posterior lateral arthrodesis.  We then obtained hemostasis using bipolar electrocautery. We irrigated the wound out with bacitracin solution. We inspected the thecal sac and nerve roots and noted they were well decompressed. We then removed the retractor. We placed a medium Hemovac drain in the epidural space and tunneled out through separate stab wound. We placed vancomycin powder in the wound. We reapproximated patient's thoracolumbar fascia with interrupted #1 Vicryl suture. We reapproximated patient's subcutaneous tissue with interrupted 2-0 Vicryl suture. The reapproximated patient's skin with Steri-Strips and benzoin. The wound was then coated with bacitracin ointment. A sterile dressing was applied. The drapes were removed. The patient was subsequently returned to the supine position where they were extubated by the anesthesia team. He was then  transported to the post anesthesia care unit in stable condition. All sponge instrument and needle counts were reportedly correct at the end of this case.

## 2014-05-21 NOTE — Anesthesia Postprocedure Evaluation (Signed)
Anesthesia Post Note  Patient: Reginald HolidayJames Oran  Procedure(s) Performed: Procedure(s) (LRB): LUMBAR FOUR-FIVE POSTERIOR LUMBAR FUSION 1 LEVEL (N/A)  Anesthesia type: General  Patient location: PACU  Post pain: Pain level controlled and Adequate analgesia  Post assessment: Post-op Vital signs reviewed, Patient's Cardiovascular Status Stable, Respiratory Function Stable, Patent Airway and Pain level controlled  Last Vitals:  Filed Vitals:   05/21/14 1915  BP: 168/76  Pulse: 73  Temp:   Resp: 13    Post vital signs: Reviewed and stable  Level of consciousness: awake, alert  and oriented  Complications: No apparent anesthesia complications

## 2014-05-22 LAB — BASIC METABOLIC PANEL WITH GFR
Anion gap: 9 (ref 5–15)
BUN: 9 mg/dL (ref 6–20)
CO2: 26 mmol/L (ref 22–32)
Calcium: 8.4 mg/dL — ABNORMAL LOW (ref 8.9–10.3)
Chloride: 101 mmol/L (ref 101–111)
Creatinine, Ser: 0.72 mg/dL (ref 0.61–1.24)
GFR calc Af Amer: >60 mL/min
GFR calc non Af Amer: >60 mL/min
Glucose, Bld: 127 mg/dL — ABNORMAL HIGH (ref 70–99)
Potassium: 3.7 mmol/L (ref 3.5–5.1)
Sodium: 136 mmol/L (ref 135–145)

## 2014-05-22 LAB — CBC
HCT: 36.1 % — ABNORMAL LOW (ref 39.0–52.0)
Hemoglobin: 12.3 g/dL — ABNORMAL LOW (ref 13.0–17.0)
MCH: 32.4 pg (ref 26.0–34.0)
MCHC: 34.1 g/dL (ref 30.0–36.0)
MCV: 95 fL (ref 78.0–100.0)
PLATELETS: 176 10*3/uL (ref 150–400)
RBC: 3.8 MIL/uL — ABNORMAL LOW (ref 4.22–5.81)
RDW: 12.3 % (ref 11.5–15.5)
WBC: 9.4 10*3/uL (ref 4.0–10.5)

## 2014-05-22 LAB — GLUCOSE, CAPILLARY: Glucose-Capillary: 98 mg/dL (ref 70–99)

## 2014-05-22 NOTE — Evaluation (Signed)
Physical Therapy Evaluation Patient Details Name: Reginald [name] MRN: [id number] DOB: 10/18/1935 Today's Date: 05/22/2014   History of Present Illness  Patient is a 79 yo male s/p LUMBAR FOUR-FIVE POSTERIOR LUMBAR FUSION 1 LEVEL.  Clinical Impression  Patient demonstrates deficits in functional mobility as indicated below. Will benefit from continued skilled PT to address deficits and maximize function. Will see as indicated and progress as tolerated.     Follow Up Recommendations No PT follow up;Supervision/Assistance - 24 hour    Equipment Recommendations  None recommended by PT    Recommendations for Other Services       Precautions / Restrictions Precautions Precautions: Back Precaution Booklet Issued: Yes (comment) Precaution Comments: educated with teach back Required Braces or Orthoses: Spinal Brace Spinal Brace: Lumbar corset      Mobility  Bed Mobility Overal bed mobility: Needs Assistance Bed Mobility: Sit to Sidelying;Rolling Rolling: Min guard       Sit to sidelying: Min assist General bed mobility comments: VCs for sequencing and technique, assist for elevation of LEs back to bed  Transfers Overall transfer level: Needs assistance Equipment used: Rolling walker (2 wheeled) Transfers: Sit to/from Stand Sit to Stand: Min assist         General transfer comment: VCs for hand placement and safety, assist for stability and elevation to standing  Ambulation/Gait Ambulation/Gait assistance: Supervision;Min guard Ambulation Distance (Feet): 120 Feet Assistive device: Rolling walker (2 wheeled) Gait Pattern/deviations: Step-through pattern;Decreased stride length Gait velocity: decreased Gait velocity interpretation: Below normal speed for age/gender General Gait Details: slow but steady gait, increased pain during mobilization. Multiple standing rest breaks.   Stairs            Wheelchair Mobility    Modified Rankin (Stroke Patients Only)        Balance Overall balance assessment: Needs assistance Sitting-balance support: Feet supported Sitting balance-Leahy Scale: Fair     Standing balance support: During functional activity Standing balance-Leahy Scale: Fair Standing balance comment: heavy reliance on RW                             Pertinent Vitals/Pain Pain Assessment: 0-10 Pain Score: 8  Pain Location: back Pain Descriptors / Indicators: Aching;Sore Pain Intervention(s): Monitored during session;Repositioned    Home Living Family/patient expects to be discharged to:: Private residence Living Arrangements: Spouse/significant other Available Help at Discharge: Family Type of Home: House Home Access: Stairs to enter Entrance Stairs-Rails: None Entrance Stairs-Number of Steps: 2 Home Layout: One level Home Equipment: Environmental consultantWalker - 2 wheels;Cane - single point Additional Comments: walk in shower, elevated toilets    Prior Function Level of Independence: Independent               Hand Dominance   Dominant Hand: Right    Extremity/Trunk Assessment               Lower Extremity Assessment: Generalized weakness         Communication   Communication: No difficulties  Cognition Arousal/Alertness: Awake/alert Behavior During Therapy: WFL for tasks assessed/performed;Flat affect Overall Cognitive Status: Within Functional Limits for tasks assessed                      General Comments General comments (skin integrity, edema, etc.): educated extensively regarding precautions, mobility expectations, pain management and safety with precautions.     Exercises        Assessment/Plan    PT  Assessment Patient needs continued PT services  PT Diagnosis Difficulty walking;Generalized weakness;Acute pain   PT Problem List Decreased strength;Decreased range of motion;Decreased activity tolerance;Decreased balance;Decreased mobility;Pain  PT Treatment Interventions DME  instruction;Gait training;Stair training;Functional mobility training;Therapeutic activities;Therapeutic exercise;Balance training;Patient/family education   PT Goals (Current goals can be found in the Care Plan section) Acute Rehab PT Goals Patient Stated Goal: to go home PT Goal Formulation: With patient/family Time For Goal Achievement: 06/05/14 Potential to Achieve Goals: Good    Frequency Min 5X/week   Barriers to discharge        Co-evaluation               End of Session Equipment Utilized During Treatment: Gait belt;Back brace Activity Tolerance: Patient limited by fatigue;Patient limited by pain Patient left: in bed;with call bell/phone within reach;with family/visitor present Nurse Communication: Mobility status         Time: [id number] PT Time Calculation (min) (ACUTE ONLY): 25 min   Charges:   PT Evaluation $Initial PT Evaluation Tier I: 1 Procedure PT Treatments $Gait Training: 8-22 mins   PT G CodesFabio Asa:        Lanai Conlee J 05/22/2014, 11:16 AM Charlotte Crumbevon Saragrace Selke, PT DPT  [phone]

## 2014-05-22 NOTE — Progress Notes (Signed)
Patient ID: Reginald [name], male   DOB: 12/20/1935, 79 y.o.   MRN: [id number] Subjective:  The patient is alert and pleasant. He looks well. He is in no apparent distress. He is pleased with his progress. I spoke with his wife.  Objective: Vital signs in last 24 hours: Temp:  [97.7 F (36.5 C)-98.7 F (37.1 C)] 98.4 F (36.9 C) (05/05 1227) Pulse Rate:  [73-92] 92 (05/05 1227) Resp:  [12-20] 18 (05/05 1227) BP: (109-190)/(51-111) 119/61 mmHg (05/05 1227) SpO2:  [90 %-100 %] 91 % (05/05 1227)  Intake/Output from previous day: 05/04 0701 - 05/05 0700 In: 3320 [P.O.:120; I.V.:2700; IV Piggyback:500] Out: 1815 [Urine:1125; Drains:240; Blood:450] Intake/Output this shift: Total I/O In: 480 [P.O.:480] Out: 510 [Urine:400; Drains:110]  Physical exam the patient is alert and oriented 3. His strength is grossly normal in his lower extremities.  Lab Results:  Recent Labs  05/22/14 0248  WBC 9.4  HGB 12.3*  HCT 36.1*  PLT 176   BMET  Recent Labs  05/22/14 0248  NA 136  K 3.7  CL 101  CO2 26  GLUCOSE 127*  BUN 9  CREATININE 0.72  CALCIUM 8.4*    Studies/Results: Dg Lumbar Spine 2-3 Views  05/21/2014   CLINICAL DATA:  L4-L5 PLIF.  Initial encounter.  EXAM: DG C-ARM 61-120 MIN; LUMBAR SPINE - 2-3 VIEW  COMPARISON:  Intraoperative radiograph same date. Lumbar myelogram CT 04/21/2014.  FLUOROSCOPY TIME:  C-arm fluoroscopic images were obtained intraoperatively and submitted for post operative interpretation. Please see the performing provider's procedural report for the fluoroscopy time utilized.  FINDINGS: Two spot images of the lower lumbar spine are submitted from the operating room. Based on prior numbering, there is a transitional S1 segment. These views demonstrate the placement of bilateral pedicle screws and a prosthetic disc at L4-5. The interconnecting rods have not yet been placed.  IMPRESSION: Intraoperative views during L4-5 fusion. No demonstrated complication.    Electronically Signed   By: Carey BullocksWilliam  Veazey M.D.   On: 05/21/2014 17:35   Dg Lumbar Spine 1 View  05/21/2014   CLINICAL DATA:  Intraoperative localization for L4-5 PLIF  EXAM: LUMBAR SPINE - 1 VIEW  COMPARISON:  04/21/2014  FINDINGS: Lateral radiograph of the lumbar spine was performed intraoperatively and reveals a surgical retractor and instrument at the posterior elements of L4 superiorly.  IMPRESSION: Intraoperative localization at L4   Electronically Signed   By: Alcide CleverMark  Lukens M.D.   On: 05/21/2014 16:34   Dg C-arm 1-60 Min  05/21/2014   CLINICAL DATA:  L4-L5 PLIF.  Initial encounter.  EXAM: DG C-ARM 61-120 MIN; LUMBAR SPINE - 2-3 VIEW  COMPARISON:  Intraoperative radiograph same date. Lumbar myelogram CT 04/21/2014.  FLUOROSCOPY TIME:  C-arm fluoroscopic images were obtained intraoperatively and submitted for post operative interpretation. Please see the performing provider's procedural report for the fluoroscopy time utilized.  FINDINGS: Two spot images of the lower lumbar spine are submitted from the operating room. Based on prior numbering, there is a transitional S1 segment. These views demonstrate the placement of bilateral pedicle screws and a prosthetic disc at L4-5. The interconnecting rods have not yet been placed.  IMPRESSION: Intraoperative views during L4-5 fusion. No demonstrated complication.   Electronically Signed   By: Carey BullocksWilliam  Veazey M.D.   On: 05/21/2014 17:35    Assessment/Plan: Postop day #1: The patient is doing well. We will continue to mobilize him. We will discontinue his Hemovac drain. He will likely go home tomorrow.  LOS: 1  day     Letasha Kershaw D 05/22/2014, 2:16 PM

## 2014-05-22 NOTE — Progress Notes (Signed)
Utilization review completed.  

## 2014-05-22 NOTE — Progress Notes (Signed)
Orthopedic Tech Progress Note Patient Details:  Reginald HolidayJames Pociask 03/25/1935 [id number]  Patient ID: Reginald HolidayJames Sides, male   DOB: 07/10/1935, 79 y.o.   MRN: [id number] RN stated that pt already has aspen lumbar fusion brace. Nikki DomCrawford, [name] 05/22/2014, 7:52 AM

## 2014-05-23 MED ORDER — DIAZEPAM 2 MG PO TABS: 2.0000 mg | ORAL_TABLET | Freq: Four times a day (QID) | ORAL | Status: DC | PRN

## 2014-05-23 MED ORDER — HYDROMORPHONE HCL 2 MG PO TABS
2.0000 mg | ORAL_TABLET | ORAL | Status: DC | PRN
  Administered 2014-05-23: 2 mg via ORAL
  Filled 2014-05-23: qty 1

## 2014-05-23 MED ORDER — HYDROMORPHONE HCL 2 MG PO TABS: 2.0000 mg | ORAL_TABLET | ORAL | Status: DC | PRN

## 2014-05-23 MED FILL — Sodium Chloride IV Soln 0.9%: INTRAVENOUS | Qty: 1000 | Status: AC

## 2014-05-23 MED FILL — Heparin Sodium (Porcine) Inj 1000 Unit/ML: INTRAMUSCULAR | Qty: 30 | Status: AC

## 2014-05-23 NOTE — Progress Notes (Signed)
Physical Therapy Treatment Patient Details Name: Reginald HolidayJames [name] MRN: [id number] DOB: 09/16/1935 Today's Date: 05/23/2014    History of Present Illness Patient is a 79 yo male s/p LUMBAR FOUR-FIVE POSTERIOR LUMBAR FUSION 1 LEVEL.    PT Comments    Patient and wife benefit from review of bed mobility and from practicing stairs.  Wife states they went to outpatient PT after last surgery.  Would be helpful to to again when cleared by MD due to has gait abnormality and LE weakness.  No current follow up needs, however.  Follow Up Recommendations  No PT follow up;Supervision/Assistance - 24 hour     Equipment Recommendations  None recommended by PT    Recommendations for Other Services       Precautions / Restrictions Precautions Precautions: Back Precaution Booklet Issued: Yes (comment) Precaution Comments: reviewed Required Braces or Orthoses: Spinal Brace Spinal Brace: Lumbar corset;Applied in sitting position    Mobility  Bed Mobility Overal bed mobility: Needs Assistance Bed Mobility: Rolling;Sidelying to Sit Rolling: Min guard Sidelying to sit: Min assist     Sit to sidelying: Min assist General bed mobility comments: cues for technique with wife present to learn; assist to get hips all the way turned with rolling, bedrail down, so assist to lift trunk and cues to position hands (difficulty due to hump in bottom of bed,) assist to side with legs  Transfers Overall transfer level: Needs assistance Equipment used: Rolling walker (2 wheeled) Transfers: Sit to/from Stand Sit to Stand: Min assist         General transfer comment: lower surface of bed, cues for hand placement  Ambulation/Gait   Ambulation Distance (Feet): 120 Feet Assistive device: Rolling walker (2 wheeled) Gait Pattern/deviations: Decreased stride length;Shuffle     General Gait Details: reported already walking this morning down hall; to stairs close to his room, wife reports gait much improved from  prior to surgery due to was leaning over more and not picking up left foot well   Stairs Stairs: Yes Stairs assistance: Min assist Stair Management: Backwards;With walker Number of Stairs: 2 General stair comments: cues for technique and assist to hold walker  Wheelchair Mobility    Modified Rankin (Stroke Patients Only)       Balance Overall balance assessment: Needs assistance         Standing balance support: Bilateral upper extremity supported Standing balance-Leahy Scale: Poor Standing balance comment: needs walker for support                    Cognition Arousal/Alertness: Awake/alert Behavior During Therapy: WFL for tasks assessed/performed Overall Cognitive Status: Impaired/Different from baseline Area of Impairment: Memory     Memory: Decreased recall of precautions         General Comments: son reports returned to supine incorrectly; pt laying in bed in diagonal position    Exercises      General Comments General comments (skin integrity, edema, etc.): reviewed car transfers, precautions, walking for exercise at home; wife reports plans to go to outpatient PT when cleared to start per MD      Pertinent Vitals/Pain Pain Score: 7  Pain Location: back Pain Descriptors / Indicators: Aching Pain Intervention(s): Monitored during session;Patient requesting pain meds-RN notified    Home Living                      Prior Function            PT Goals (current  goals can now be found in the care plan section) Progress towards PT goals: Progressing toward goals    Frequency  Min 5X/week    PT Plan Current plan remains appropriate    Co-evaluation             End of Session Equipment Utilized During Treatment: Gait belt;Back brace Activity Tolerance: Patient tolerated treatment well Patient left: in bed;with call bell/phone within reach;with family/visitor present     Time: [id number] PT Time Calculation (min) (ACUTE  ONLY): 27 min  Charges:  $Gait Training: 8-22 mins $Self Care/Home Management: 8-22                    G Codes:      WYNN,CYNDI 05/23/2014, 10:29 AM  Sheran Lawlessyndi Wynn, PT [phone] 05/23/2014

## 2014-05-23 NOTE — Progress Notes (Signed)
Patient alert and oriented, mae's well, voiding adequate amount of urine, swallowing without difficulty, c/o mild pain and medication already given. Patient discharged home with family. Script and discharged instructions given to patient. Patient and family stated understanding of d/c instructions given and has an appointment with MD.  

## 2014-05-23 NOTE — Discharge Summary (Signed)
Physician Discharge Summary  Patient ID: Reginald HolidayJames Worthy MRN: [id number] DOB/AGE: 77/03/1935 77 y.o.  Admit date: 05/21/2014 Discharge date: 05/23/2014  Admission Diagnoses: L4-5 spondylolisthesis, spinal stenosis, lumbago, lumbar radiculopathy, neurogenic claudication  Discharge Diagnoses: The same Active Problems:   Spondylolisthesis of lumbar region   Discharged Condition: good  Hospital Course: I performed a L4-5 decompression, instrumentation, and fusion on the patient on 05/21/2014. The surgery went well.  The patient's postoperative course was unremarkable. His leg pain resolved with surgery. On postoperative day #2 the patient and his wife requested discharge to home. He was given oral and written discharge instructions. All his questions were answered.  Consults: PT Significant Diagnostic Studies: None Treatments: Redo L4-5 laminectomy, instrumentation, and fusion. Discharge Exam: Blood pressure 132/77, pulse 106, temperature 98.8 F (37.1 C), temperature source Oral, resp. rate 20, height 5\' 8"  (1.727 m), weight 94.405 kg (208 lb 2 oz), SpO2 91 %. The patient is alert and pleasant. His strength is normal his lower extremities. He looks well.  Disposition: Home  Discharge Instructions    Call MD for:  difficulty breathing, headache or visual disturbances    Complete by:  As directed      Call MD for:  extreme fatigue    Complete by:  As directed      Call MD for:  hives    Complete by:  As directed      Call MD for:  persistant dizziness or light-headedness    Complete by:  As directed      Call MD for:  persistant nausea and vomiting    Complete by:  As directed      Call MD for:  redness, tenderness, or signs of infection (pain, swelling, redness, odor or green/yellow discharge around incision site)    Complete by:  As directed      Call MD for:  severe uncontrolled pain    Complete by:  As directed      Call MD for:  temperature >100.4    Complete by:  As directed       Diet - low sodium heart healthy    Complete by:  As directed      Discharge instructions    Complete by:  As directed   Call [phone] for a followup appointment. Take a stool softener while you are using pain medications.     Driving Restrictions    Complete by:  As directed   Do not drive for 2 weeks.     Increase activity slowly    Complete by:  As directed      Lifting restrictions    Complete by:  As directed   Do not lift more than 5 pounds. No excessive bending or twisting.     May shower / Bathe    Complete by:  As directed   He may shower after the pain she is removed 3 days after surgery. Leave the incision alone.     Remove dressing in 24 hours    Complete by:  As directed             Medication List    STOP taking these medications        HYDROcodone-acetaminophen 5-325 MG per tablet  Commonly known as:  NORCO/VICODIN     TYLENOL ARTHRITIS PAIN PO      TAKE these medications        aspirin EC 81 MG tablet  Take 81 mg by mouth daily.  chlorthalidone 25 MG tablet  Commonly known as:  HYGROTON  Take 25 mg by mouth daily.     diazepam 2 MG tablet  Commonly known as:  VALIUM  Take 1 tablet (2 mg total) by mouth every 6 (six) hours as needed for muscle spasms.     fexofenadine 180 MG tablet  Commonly known as:  ALLEGRA  Take 180 mg by mouth daily.     gemfibrozil 600 MG tablet  Commonly known as:  LOPID  Take 600 mg by mouth 2 (two) times daily before a meal.     HYDROmorphone 2 MG tablet  Commonly known as:  DILAUDID  Take 1 tablet (2 mg total) by mouth every 4 (four) hours as needed for severe pain.     LORZONE PO  Take 500 mg by mouth 2 (two) times daily.     montelukast 10 MG tablet  Commonly known as:  SINGULAIR  Take 10 mg by mouth at bedtime.     omeprazole 40 MG capsule  Commonly known as:  PRILOSEC  Take 40 mg by mouth daily.     quinapril 40 MG tablet  Commonly known as:  ACCUPRIL  Take 40 mg by mouth daily.            Follow-up Information    Follow up with Cristi LoronJENKINS,Diondre Pulis D, MD.   Specialty:  Neurosurgery   Contact information:   [address] [place] [address] [phone]       Signed: Cristi LoronJENKINS,Tammara Massing D 05/23/2014, 10:02 AM

## 2014-05-23 NOTE — Progress Notes (Signed)
Orthopedic Tech Progress Note Patient Details:  Reginald [name] 12/05/1935 [id number]  Patient ID: Reginald [name], male   DOB: 12/08/1935, 79 y.o.   MRN: [id number] Called in bio-tech brace order; spoke with Reginald MaltaStephanie  Ahna [name] 05/23/2014, 9:54 AM

## 2015-05-23 IMAGING — CT CT L SPINE W/ CM
4 of 10 series · 12 of 34 positions shown, 13 images · non-contrast
Comparison: MRI 09/28/2013.

CLINICAL DATA: Lumbar spinal stenosis. Prior lumbar decompression
with recurrent symptoms. Six months of relief following initially
decompression.
TECHNIQUE: Contiguous axial images were obtained through the Lumbar spine after
the intrathecal infusion of infusion. Coronal and sagittal
reconstructions were obtained of the axial image sets.

[Series 4: l spine bone · axial · 0.27mm/px · z∈[-270,-190]mm · 2 of 96 slices shown]
[im 32/96  bone]
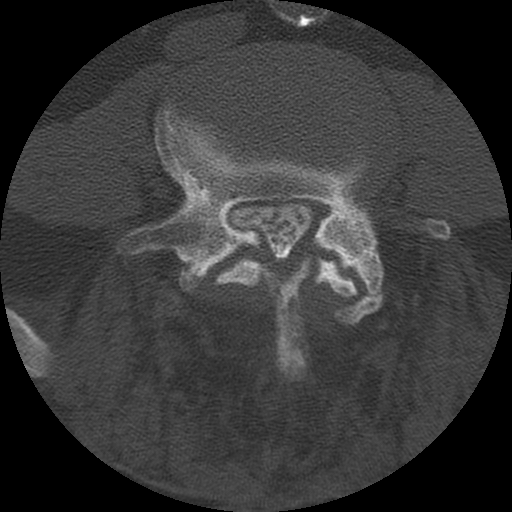
[im 64/96  bone]
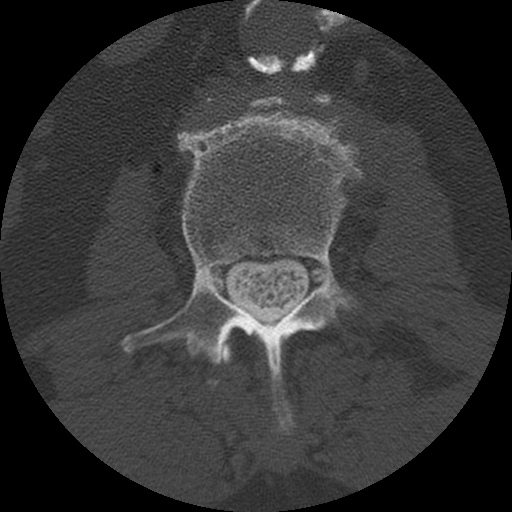

[Series 5: l spine detail · axial · 0.27mm/px · z∈[-290,-170]mm · 3 of 96 slices shown, 4 images]
[im 24/96  soft-tissue]
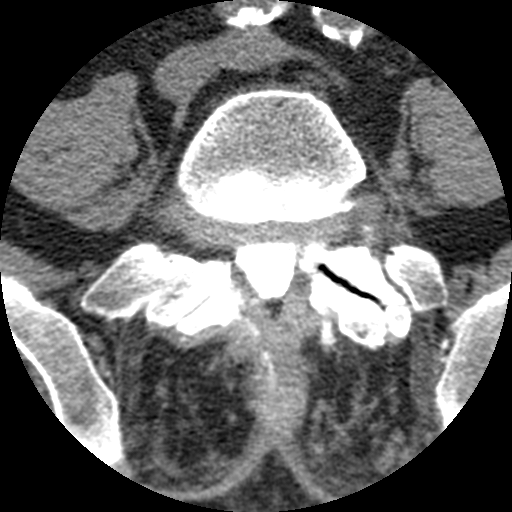
[im 24/96  bone]
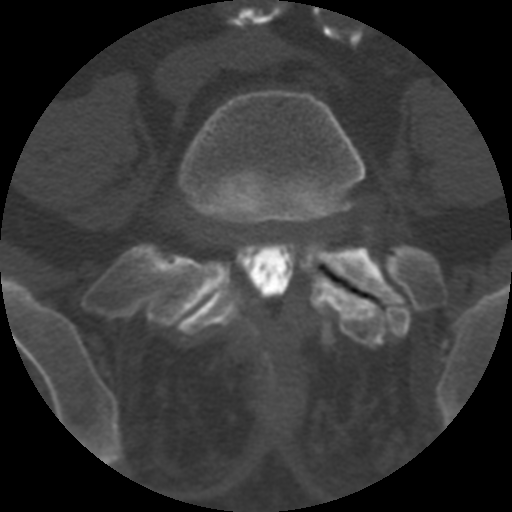
[im 48/96  bone]
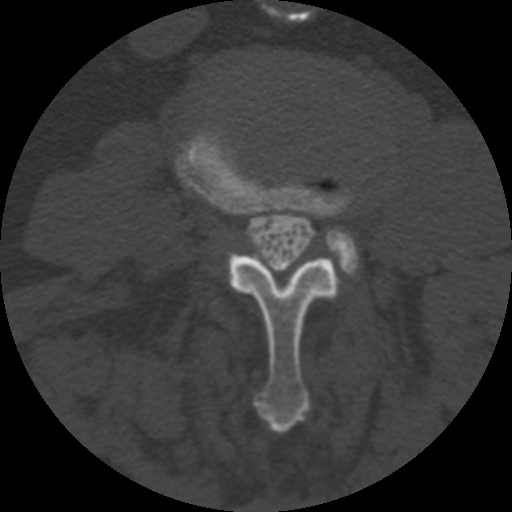
[im 72/96  bone]
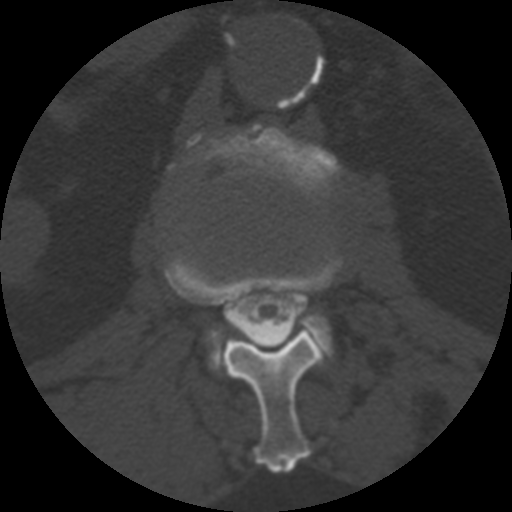

[Series 200: cor · coronal · 0.48mm/px · 4 of 47 slices shown]
[im 12/47  bone]
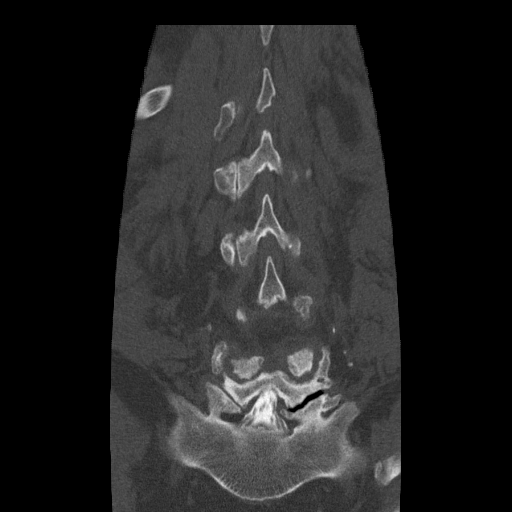
[im 13/47  soft-tissue]
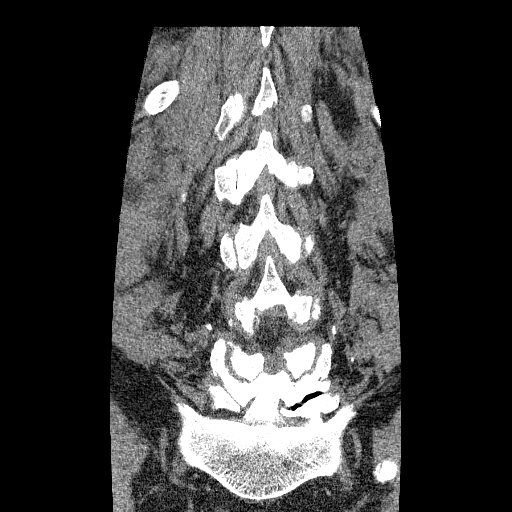
[im 24/47  bone]
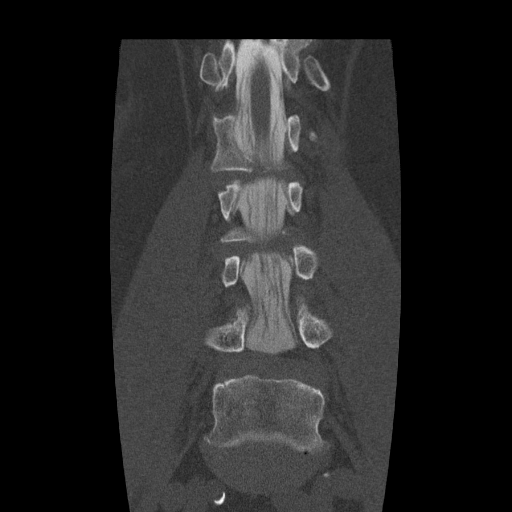
[im 35/47  bone]
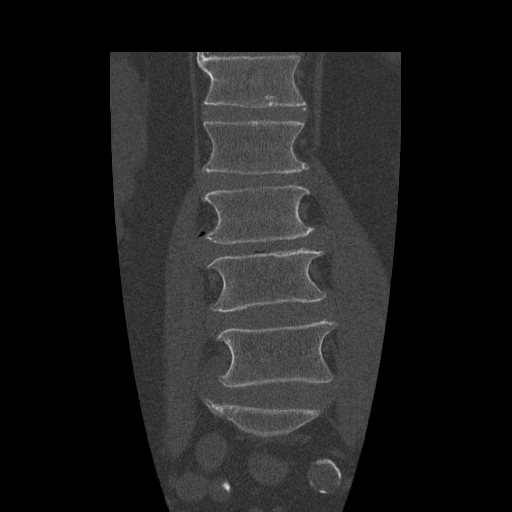

[Series 201: cor lower · coronal · 0.27mm/px · 3 of 47 slices shown]
[im 10/47  bone]
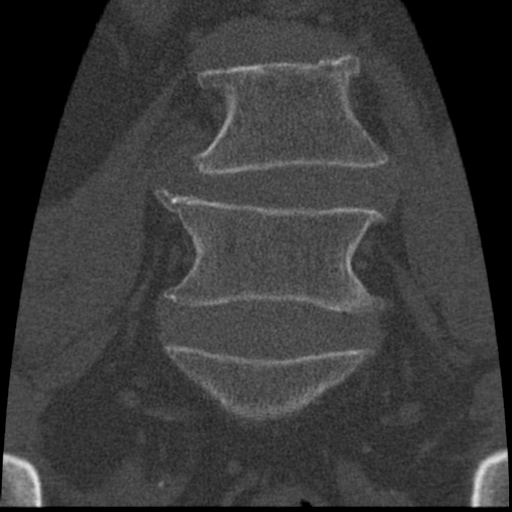
[im 19/47  bone]
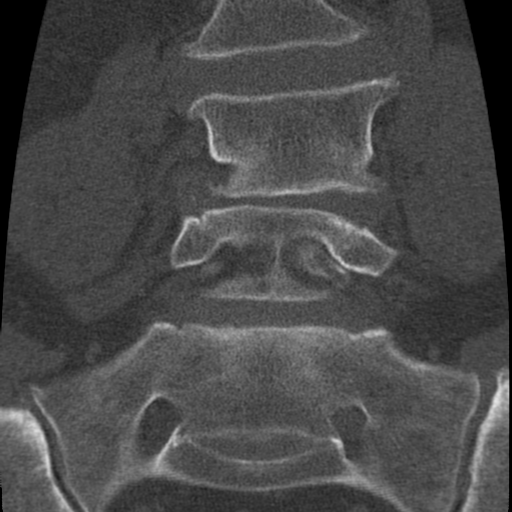
[im 28/47  bone]
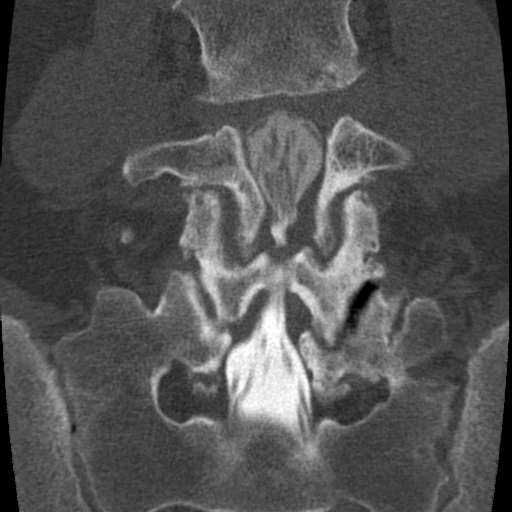

[12 of 34 positions shown; findings below may reference images not displayed]

EXAM:
LUMBAR MYELOGRAM

FLUOROSCOPY TIME:  2 minutes 6 seconds

Dose: 55 dHycmW

PROCEDURE:
After thorough discussion of risks and benefits of the procedure
including bleeding, infection, injury to nerves, blood vessels,
adjacent structures as well as headache and CSF leak, written and
oral informed consent was obtained. Consent was obtained by Dr.
Trisha Lizardo. Time out form was completed.

Patient was positioned prone on the fluoroscopy table. Local
anesthesia was provided with 1% lidocaine without epinephrine after
prepped and draped in the usual sterile fashion. Puncture was
performed at L2-L3 using a 3 1/2 inch 22-gauge spinal needle via
RIGHT paramedian approach. Using a single pass through the dura, the
needle was placed within the thecal sac, with return of clear CSF.
15 mL of Lmnipaque-I25 was injected into the thecal sac, with normal
opacification of the nerve roots and cauda equina consistent with
free flow within the subarachnoid space.

I personally performed the lumbar puncture and administered the
intrathecal contrast. I also personally supervised acquisition of
the myelogram images.
FINDINGS: LUMBAR MYELOGRAM FINDINGS:

There is lumbosacral transitional anatomy. This report assumes that
there are 5 lumbar type vertebral bodies. Recommend close
correlation with radiographs if intervention is elected.

S1 is solidly fused to the sacrum.On the standing upright frontal
images, there is a levoconvex curve with the apex at L4. The curve
is mild. There is leftward slip of L4 on L5 measuring 9 mm. This is
part of a rotoscoliosis. On the lateral view in the neutral
position, the most prominent spondylolisthesis is at L4-L5 and
L2-L3.

L2-L3 shows 7 mm of retrolisthesis in the neutral position.
Extension range of motion is poor and there is no pathologic motion
at L2-L3 with flexion and extension maneuvers.

3 mm of retrolisthesis of L3 on L4 is present which does not change
with flexion and extension maneuvers.

L4-L5 grade I anterolisthesis measures 6 mm in the upright neutral
position. There is no pathologic motion with flexion and extension
maneuvers with the anterolisthesis continuing to measure 6 mm.

Extensive aortoiliac atherosclerosis is present.

CT LUMBAR MYELOGRAM FINDINGS:

Segmentation: There is lumbosacral transitional anatomy. This report
assumes that there are 5 lumbar type vertebral bodies. Recommend
close correlation with radiographs if intervention is elected. The
numbering scheme used on the prior MRIs is unknown. Best visual
reference is laminectomy at L4. The S1 segment is solidly fused to
the sacrum. There are rudimentary L1 ribs bilaterally.

Alignment: Similar upright radiographs. Leftward slip of L4 on L5.
Grade I anterolisthesis of L4 on L5 is less pronounced by CT,
probably due to supine positioning. This is compatible with
instability between supine and standing positions. L2-L3
retrolisthesis appears similar.

Vertebrae: No destructive osseous lesions. Vertebral body height is
preserved.

Conus medullaris: Normal at L2.

Paraspinal tissues: Aortoiliac atherosclerosis. Bilateral SI joint
degenerative disease.

Disc levels:

T12-L1: Disc degeneration with vacuum disc anteriorly. Marginal
osteophytes. No ankylosis. Shallow posterior disc bulging. Mild
RIGHT facet arthrosis. Central canal and foramina appear patent.

L1-L2: Disc degeneration and Schmorl's nodes. Shallow broad-based
disc bulging. Moderate RIGHT facet arthrosis. Mild LEFT facet
arthrosis. Central canal and foramina appear adequately patent.

L2-L3: Disc degeneration and loss of height. Intercalary bone is
present anteriorly in the annulus. Vacuum disc is present. There is
a broad-based posterior disc extrusion with mild caudal migration of
disc material eccentric to the LEFT. This produces mild central
stenosis. Additionally, there is mild bilateral foraminal stenosis
associated with retrolisthesis. Facet joints are mildly distracted
secondary to the retrolisthesis. Nitrogen gas is present in the
RIGHT paraspinal soft tissues associated with circumferential disc
bulging. This is probably contained within the outer fibers of the
annulus.

L3-L4: Relative preservation of disc height. Vacuum disc. Mild
central stenosis. Marginal osteophytes are present. 3 mm of
retrolisthesis is similar. There is moderate RIGHT and mild LEFT
foraminal stenosis associated with posterior disc bulging and
retrolisthesis. Mild distraction of the facet joints. Minimal facet
degeneration. The subarticular zones appear patent.

L4-L5: L4 Laminectomy. Moderate recurrent central stenosis. Severe
facet arthrosis. Broad-based disc extrusion is present eccentric to
the RIGHT. The disc extrusion is most prominent in the RIGHT
foraminal region. Extruded disc has cranial migration and compresses
the RIGHT L4 nerve in the foramen. Moderate LEFT foraminal stenosis
due to broad-based disc bulging. Moderate to severe RIGHT facet
arthrosis with moderate LEFT facet arthrosis. Mass effect in the
LEFT subarticular zone associated with either scarring or residual
ligamentum flavum thickening/ redundancy. RIGHT subarticular
encroachment is also present without compression.

L5-S1: Mild disc degeneration and broad-based posterior disc
bulging. There is severe LEFT facet arthrosis with LEFT vacuum
joint. Moderate to severe RIGHT facet arthrosis. Central canal
appears patent. LEFT subarticular stenosis is present with medial
deviation of the descending LEFT S1 nerve. The RIGHT subarticular
zone appears patent. Moderate LEFT foraminal stenosis associated
with bulging disc and severe facet arthrosis with anterior facet
spurring. The RIGHT neural foramen appears patent. Heterotopic bone
is present around the inferior recess of the LEFT L5-S1 facet joint.
Degenerative locules of the nitrogen gas associated with
pseudoarthrosis with heterotopic bone.

S1-S2: Rudimentary disc without stenosis.
IMPRESSION: 1. Technically successful L2-L3 lumbar puncture for lumbar
myelogram.
2. L4 laminectomy with moderate recurrent central stenosis. Stenosis
is due to a combination of degenerative disc and facet disease.
There is bilateral foraminal stenosis due to broad-based disc
extrusion. The extrusion is most pronounced in the RIGHT foramen,
compressing the exiting RIGHT L4 nerve and producing severe RIGHT
L4-L5 foraminal stenosis.
3. L5-S1 LEFT-greater-than-RIGHT facet arthrosis with LEFT foraminal
stenosis. Mild LEFT subarticular stenosis with medial deviation of
the descending LEFT S1 nerve.
4. More mild degenerative disease at L1-L2, L2-L3 and L3-L4.

## 2015-06-22 IMAGING — RF DG LUMBAR SPINE 2-3V
1 series · 2 of 2 positions shown · non-contrast
Comparison: Intraoperative radiograph same date.

CLINICAL DATA: L4-L5 PLIF.  Initial encounter.

EXAM:
DG C-ARM 61-120 MIN; LUMBAR SPINE - 2-3 VIEW

[Series 1: run · 2 of 2 slices shown]
[im 1/2]
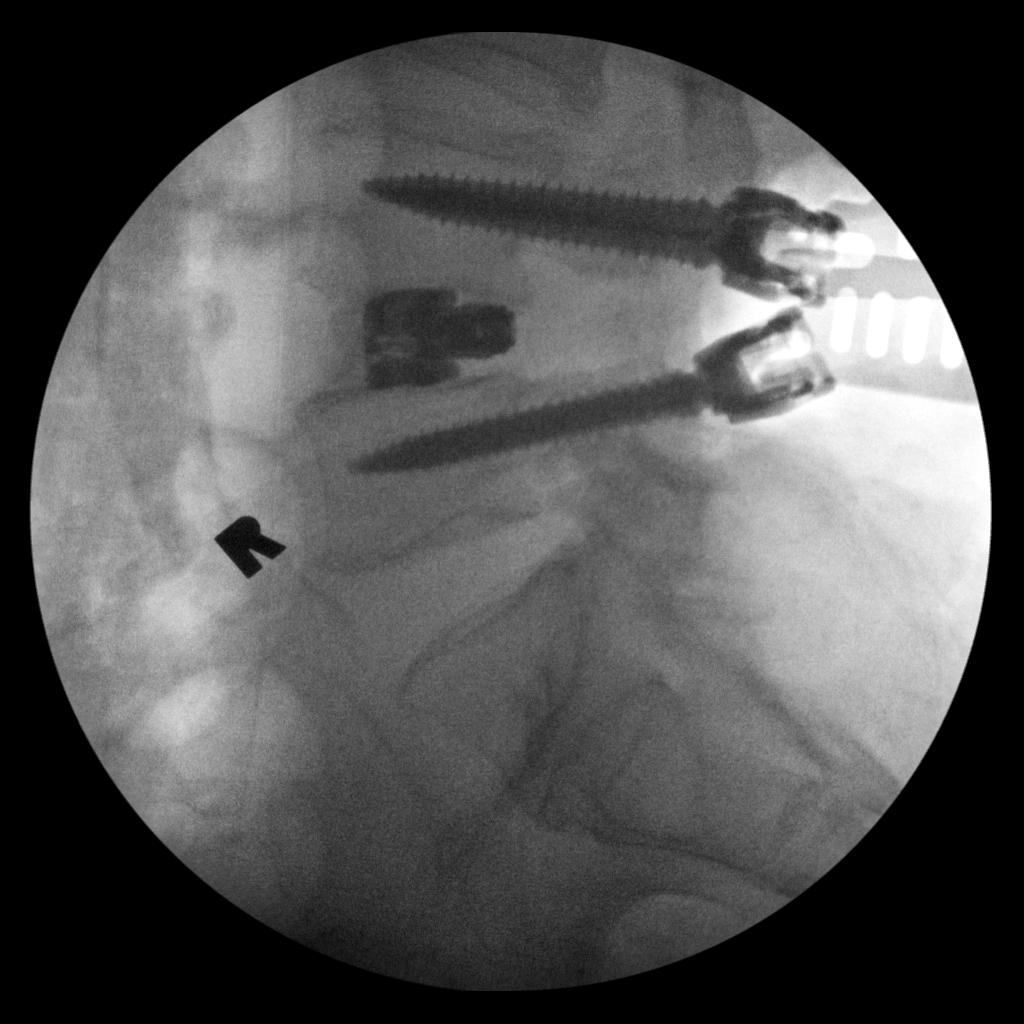
[im 2/2]
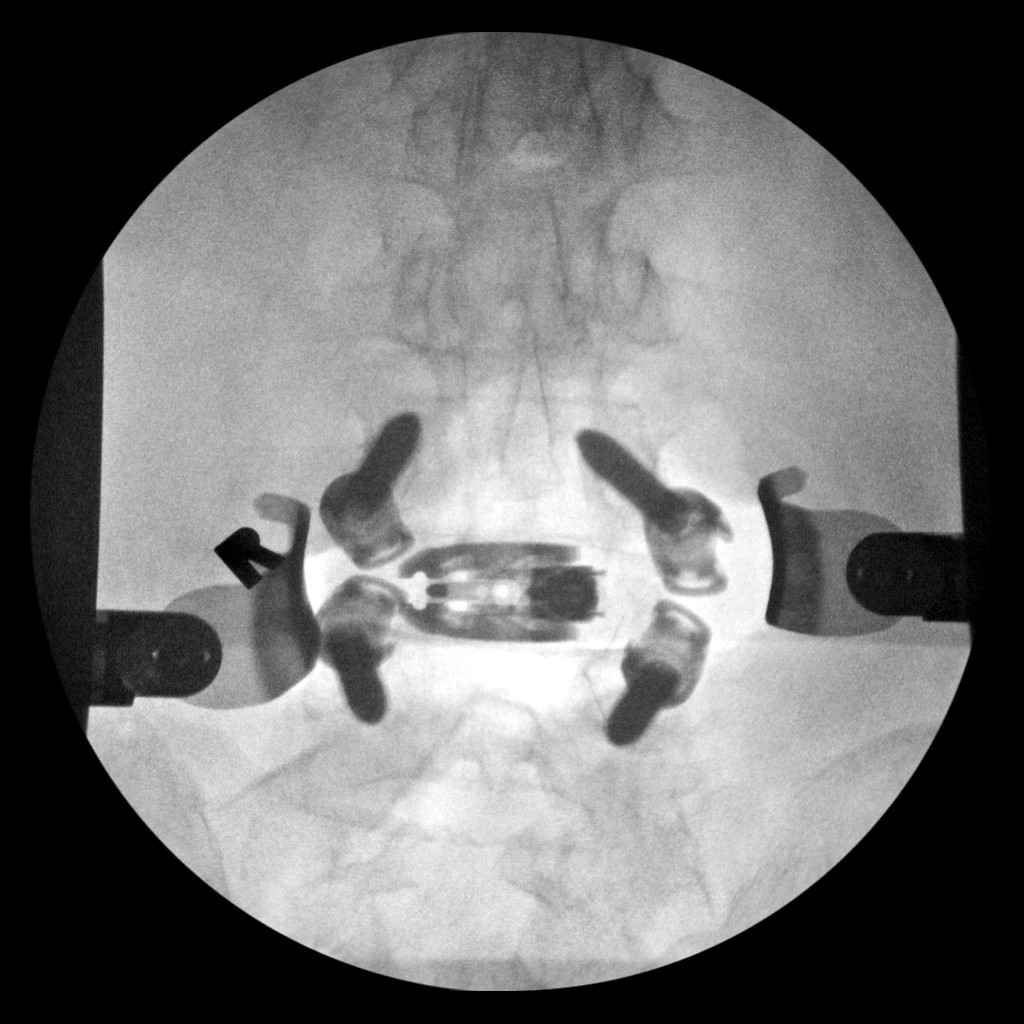

[2 of 2 positions shown; findings below may reference images not displayed]

Lumbar myelogram
CT 04/21/2014.

FLUOROSCOPY TIME:  C-arm fluoroscopic images were obtained
intraoperatively and submitted for post operative interpretation.
Please see the performing provider's procedural report for the
fluoroscopy time utilized.
FINDINGS: Two spot images of the lower lumbar spine are submitted from the
operating room. Based on prior numbering, there is a transitional S1
segment. These views demonstrate the placement of bilateral pedicle
screws and a prosthetic disc at L4-5. The interconnecting rods have
not yet been placed.
IMPRESSION: Intraoperative views during L4-5 fusion. No demonstrated
complication.

## 2016-08-26 ENCOUNTER — Encounter (HOSPITAL_COMMUNITY): Payer: Self-pay | Admitting: *Deleted

## 2016-08-26 ENCOUNTER — Other Ambulatory Visit: Payer: Self-pay | Admitting: Neurosurgery

## 2016-08-26 NOTE — Progress Notes (Signed)
Pt denies SOB, chest pain, and being under the care of a cardiologist. Pt denies having a cardiac cath and echo but stated that a stress test was performed and possibly an EKG (within the last year) ; records requested from Dr. Fransico MichaelLeverne Reinecke. Pt stated that an A1c  was completed on 08/09/16 by PCP Dr. Truman Haywardichard Smith; records requested. Pt denies having a chest x ray within the last year. Pt made aware to stop taking  Aspirin, vitamins, fish oil and herbal medications. Do not take any NSAIDs ie: Ibuprofen, Advil, Naproxen (Aleve). Motrin, BC and Goody Powder or any medication containing Aspirin. Pt made aware to not take Metformin DOS. Pt made aware to check BG every 2 hours prior to arrival to hospital on DOS. Pt made aware to treat a BG < 70 with  4 ounces of apple or cranberry juice, wait 15 minutes after intervention to recheck BG, if BG remains < 70, call Short Stay unit to speak with a nurse. Pt verbalized understanding of all pre-op instructions.

## 2016-08-29 NOTE — Progress Notes (Signed)
Anesthesia Chart Review:  Pt is a same day work up.   Pt is a 81 year old male scheduled for L3-4 microdiscectomy on 08/30/2016 with Tressie StalkerJeffrey Jenkins, MD  - PCP [facility] Dr. [facility]ichard Smith [facility].  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  Former smoker. S/p lumbar fusion 05/21/14.   Medications include: ASA 81mg , lipitor, chlorthalidone, gemfibrozil, metformin, prilosec, quinapril  Labs will be obtained DOS  EKG will be obtained DOS  Aortoiliac duplex 03/25/16 (by notes): Maximum aortic diameter 2.5 cm with right and left iliac diameters of 1.7 cm each  Nuclear stress test 11/16/15 (by notes): No evidence for ischemia, previous infarction. Normal gated SPECT.  Carotid duplex 03/05/15 (by notes): 1-10% bilateral ICA stenosis. Right and left ICA/CCA ratios of 1.46 and 1.12 respectively. Antegrade flow in bilateral vertebral arteries.  Echo 11/16/11 (by notes): Mild concentric LVH, normal systolic function, EF 55-60%. Mild diastolic dysfunction. Slightly enlarged LA. Moderate aortic sclerosis but no stenosis. No significant valvular heart disease. Normal pulmonary artery pressure.  If labs and EKG acceptable DOS, I anticipate pt can proceed as scheduled.   Rica Mastngela Melynda Krzywicki, FNP-BC [facility]Anesthesiology Phone: [phone] 08/29/2016 3:21 PM

## 2016-08-30 ENCOUNTER — Encounter (HOSPITAL_COMMUNITY): Payer: Self-pay | Admitting: *Deleted

## 2016-08-30 ENCOUNTER — Ambulatory Visit (HOSPITAL_COMMUNITY)
Admission: RE | Admit: 2016-08-30 | Discharge: 2016-08-31 | Disposition: A | Discharge status: Home / Self Care | Payer: Medicare Other | Source: Ambulatory Visit | Attending: Neurosurgery | Admitting: Neurosurgery

## 2016-08-30 ENCOUNTER — Ambulatory Visit (HOSPITAL_COMMUNITY): Payer: Medicare Other

## 2016-08-30 ENCOUNTER — Encounter (HOSPITAL_COMMUNITY)
Admission: RE | Disposition: A | Discharge status: Home / Self Care | Payer: Self-pay | Source: Ambulatory Visit | Attending: Neurosurgery

## 2016-08-30 ENCOUNTER — Ambulatory Visit (HOSPITAL_COMMUNITY): Payer: Medicare Other | Admitting: Emergency Medicine

## 2016-08-30 DIAGNOSIS — Z7982 Long term (current) use of aspirin: Secondary | ICD-10-CM | POA: Insufficient documentation

## 2016-08-30 DIAGNOSIS — Z419 Encounter for procedure for purposes other than remedying health state, unspecified: Secondary | ICD-10-CM

## 2016-08-30 DIAGNOSIS — Z87891 Personal history of nicotine dependence: Secondary | ICD-10-CM | POA: Insufficient documentation

## 2016-08-30 DIAGNOSIS — Z981 Arthrodesis status: Secondary | ICD-10-CM | POA: Insufficient documentation

## 2016-08-30 DIAGNOSIS — M79604 Pain in right leg: Secondary | ICD-10-CM | POA: Diagnosis present

## 2016-08-30 DIAGNOSIS — K219 Gastro-esophageal reflux disease without esophagitis: Secondary | ICD-10-CM | POA: Diagnosis not present

## 2016-08-30 DIAGNOSIS — Z79899 Other long term (current) drug therapy: Secondary | ICD-10-CM | POA: Diagnosis not present

## 2016-08-30 DIAGNOSIS — Z7984 Long term (current) use of oral hypoglycemic drugs: Secondary | ICD-10-CM | POA: Insufficient documentation

## 2016-08-30 DIAGNOSIS — M545 Low back pain: Secondary | ICD-10-CM | POA: Insufficient documentation

## 2016-08-30 DIAGNOSIS — I10 Essential (primary) hypertension: Secondary | ICD-10-CM | POA: Insufficient documentation

## 2016-08-30 DIAGNOSIS — M5116 Intervertebral disc disorders with radiculopathy, lumbar region: Secondary | ICD-10-CM | POA: Diagnosis not present

## 2016-08-30 DIAGNOSIS — M5126 Other intervertebral disc displacement, lumbar region: Secondary | ICD-10-CM | POA: Diagnosis present

## 2016-08-30 DIAGNOSIS — E119 Type 2 diabetes mellitus without complications: Secondary | ICD-10-CM | POA: Insufficient documentation

## 2016-08-30 HISTORY — PX: LUMBAR LAMINECTOMY/DECOMPRESSION MICRODISCECTOMY: SHX5026

## 2016-08-30 HISTORY — DX: Complete loss of teeth, unspecified cause, unspecified class: Z97.2

## 2016-08-30 HISTORY — DX: Pneumonia, unspecified organism: J18.9

## 2016-08-30 HISTORY — DX: Complete loss of teeth, unspecified cause, unspecified class: K08.109

## 2016-08-30 HISTORY — DX: Personal history of urinary calculi: Z87.442

## 2016-08-30 HISTORY — DX: Presence of dental prosthetic device (complete) (partial): Z97.2

## 2016-08-30 HISTORY — DX: Other intervertebral disc displacement, lumbar region: M51.26

## 2016-08-30 HISTORY — DX: Type 2 diabetes mellitus without complications: E11.9

## 2016-08-30 LAB — GLUCOSE, CAPILLARY
GLUCOSE-CAPILLARY: 110 mg/dL — AB (ref 65–99)
Glucose-Capillary: 120 mg/dL — ABNORMAL HIGH (ref 65–99)
Glucose-Capillary: 95 mg/dL (ref 65–99)
Glucose-Capillary: 151 mg/dL — ABNORMAL HIGH (ref 65–99)

## 2016-08-30 LAB — BASIC METABOLIC PANEL
Anion gap: 13 (ref 5–15)
BUN: 14 mg/dL (ref 6–20)
CHLORIDE: 99 mmol/L — AB (ref 101–111)
CO2: 21 mmol/L — ABNORMAL LOW (ref 22–32)
Calcium: 9.4 mg/dL (ref 8.9–10.3)
Creatinine, Ser: 0.67 mg/dL (ref 0.61–1.24)
GFR calc Af Amer: >60 mL/min (ref >60)
GFR calc non Af Amer: >60 mL/min (ref >60)
Glucose, Bld: 106 mg/dL — ABNORMAL HIGH (ref 65–99)
POTASSIUM: 4.2 mmol/L (ref 3.5–5.1)
SODIUM: 133 mmol/L — AB (ref 135–145)

## 2016-08-30 LAB — CBC
HEMATOCRIT: 46.5 % (ref 39.0–52.0)
Hemoglobin: 16.5 g/dL (ref 13.0–17.0)
MCH: 31.5 pg (ref 26.0–34.0)
MCHC: 35.5 g/dL (ref 30.0–36.0)
MCV: 88.7 fL (ref 78.0–100.0)
Platelets: 250 10*3/uL (ref 150–400)
RBC: 5.24 MIL/uL (ref 4.22–5.81)
RDW: 13 % (ref 11.5–15.5)
WBC: 8 10*3/uL (ref 4.0–10.5)

## 2016-08-30 LAB — SURGICAL PCR SCREEN
MRSA, PCR: NEGATIVE
Staphylococcus aureus: NEGATIVE

## 2016-08-30 SURGERY — LUMBAR LAMINECTOMY/DECOMPRESSION MICRODISCECTOMY 1 LEVEL
Anesthesia: General | Laterality: Right

## 2016-08-30 MED ORDER — FENTANYL CITRATE (PF) 100 MCG/2ML IJ SOLN
INTRAMUSCULAR | Status: AC
  Administered 2016-08-30: 50 ug via INTRAVENOUS
  Filled 2016-08-30: qty 2

## 2016-08-30 MED ORDER — THROMBIN 5000 UNITS EX SOLR
CUTANEOUS | Status: AC
  Filled 2016-08-30: qty 5000

## 2016-08-30 MED ORDER — DEXAMETHASONE SODIUM PHOSPHATE 10 MG/ML IJ SOLN
INTRAMUSCULAR | Status: DC | PRN
  Administered 2016-08-30: 4 mg via INTRAVENOUS

## 2016-08-30 MED ORDER — HYDROMORPHONE HCL 1 MG/ML IJ SOLN: 0.2500 mg | INTRAMUSCULAR | Status: DC | PRN

## 2016-08-30 MED ORDER — MUPIROCIN 2 % EX OINT
TOPICAL_OINTMENT | CUTANEOUS | Status: AC
  Administered 2016-08-30: 1
  Filled 2016-08-30: qty 22

## 2016-08-30 MED ORDER — THROMBIN 5000 UNITS EX SOLR
OROMUCOSAL | Status: DC | PRN
  Administered 2016-08-30: 18:00:00 via TOPICAL

## 2016-08-30 MED ORDER — PHENYLEPHRINE 40 MCG/ML (10ML) SYRINGE FOR IV PUSH (FOR BLOOD PRESSURE SUPPORT)
PREFILLED_SYRINGE | INTRAVENOUS | Status: AC
  Filled 2016-08-30: qty 10

## 2016-08-30 MED ORDER — PHENOL 1.4 % MT LIQD: 1.0000 | OROMUCOSAL | Status: DC | PRN

## 2016-08-30 MED ORDER — THROMBIN 5000 UNITS EX SOLR
CUTANEOUS | Status: AC
  Filled 2016-08-30: qty 10000

## 2016-08-30 MED ORDER — DEXAMETHASONE SODIUM PHOSPHATE 10 MG/ML IJ SOLN
INTRAMUSCULAR | Status: AC
  Filled 2016-08-30: qty 1

## 2016-08-30 MED ORDER — BUPIVACAINE-EPINEPHRINE (PF) 0.5% -1:200000 IJ SOLN
INTRAMUSCULAR | Status: AC
  Filled 2016-08-30: qty 30

## 2016-08-30 MED ORDER — METFORMIN HCL 500 MG PO TABS
1000.0000 mg | ORAL_TABLET | Freq: Two times a day (BID) | ORAL | Status: DC
  Administered 2016-08-30 – 2016-08-31 (×2): 1000 mg via ORAL
  Filled 2016-08-30 (×2): qty 2

## 2016-08-30 MED ORDER — PROPOFOL 10 MG/ML IV BOLUS
INTRAVENOUS | Status: AC
  Filled 2016-08-30: qty 20

## 2016-08-30 MED ORDER — INSULIN ASPART 100 UNIT/ML ~~LOC~~ SOLN: 0.0000 [IU] | Freq: Three times a day (TID) | SUBCUTANEOUS | Status: DC

## 2016-08-30 MED ORDER — HEMOSTATIC AGENTS (NO CHARGE) OPTIME
TOPICAL | Status: DC | PRN
  Administered 2016-08-30: 1 via TOPICAL

## 2016-08-30 MED ORDER — SODIUM CHLORIDE 0.9% FLUSH: 3.0000 mL | INTRAVENOUS | Status: DC | PRN

## 2016-08-30 MED ORDER — FENTANYL CITRATE (PF) 100 MCG/2ML IJ SOLN
50.0000 ug | Freq: Once | INTRAMUSCULAR | Status: AC
  Administered 2016-08-30: 50 ug via INTRAVENOUS

## 2016-08-30 MED ORDER — EPHEDRINE SULFATE 50 MG/ML IJ SOLN
INTRAMUSCULAR | Status: DC | PRN
  Administered 2016-08-30: 5 mg via INTRAVENOUS

## 2016-08-30 MED ORDER — ONDANSETRON HCL 4 MG/2ML IJ SOLN
INTRAMUSCULAR | Status: DC | PRN
  Administered 2016-08-30: 4 mg via INTRAVENOUS

## 2016-08-30 MED ORDER — ATORVASTATIN CALCIUM 20 MG PO TABS
10.0000 mg | ORAL_TABLET | Freq: Every day | ORAL | Status: DC
  Administered 2016-08-30: 10 mg via ORAL
  Filled 2016-08-30: qty 1

## 2016-08-30 MED ORDER — LIDOCAINE HCL (CARDIAC) 20 MG/ML IV SOLN
INTRAVENOUS | Status: DC | PRN
  Administered 2016-08-30: 60 mg via INTRAVENOUS

## 2016-08-30 MED ORDER — FENTANYL CITRATE (PF) 100 MCG/2ML IJ SOLN
INTRAMUSCULAR | Status: AC
  Administered 2016-08-30: 25 ug via INTRAVENOUS
  Filled 2016-08-30: qty 2

## 2016-08-30 MED ORDER — ACETAMINOPHEN 650 MG RE SUPP: 650.0000 mg | RECTAL | Status: DC | PRN

## 2016-08-30 MED ORDER — FENTANYL CITRATE (PF) 100 MCG/2ML IJ SOLN
INTRAMUSCULAR | Status: DC | PRN
  Administered 2016-08-30 (×5): 50 ug via INTRAVENOUS

## 2016-08-30 MED ORDER — INSULIN ASPART 100 UNIT/ML ~~LOC~~ SOLN: 0.0000 [IU] | SUBCUTANEOUS | Status: DC

## 2016-08-30 MED ORDER — DOCUSATE SODIUM 100 MG PO CAPS
100.0000 mg | ORAL_CAPSULE | Freq: Two times a day (BID) | ORAL | Status: DC
  Administered 2016-08-30 – 2016-08-31 (×2): 100 mg via ORAL
  Filled 2016-08-30 (×2): qty 1

## 2016-08-30 MED ORDER — CHLORTHALIDONE 25 MG PO TABS
25.0000 mg | ORAL_TABLET | Freq: Every day | ORAL | Status: DC
  Administered 2016-08-30 – 2016-08-31 (×2): 25 mg via ORAL
  Filled 2016-08-30 (×3): qty 1

## 2016-08-30 MED ORDER — HYDROCODONE-ACETAMINOPHEN 10-325 MG PO TABS
1.0000 | ORAL_TABLET | ORAL | Status: DC | PRN
  Administered 2016-08-30 – 2016-08-31 (×4): 1 via ORAL
  Filled 2016-08-30 (×4): qty 1

## 2016-08-30 MED ORDER — BUPIVACAINE-EPINEPHRINE (PF) 0.5% -1:200000 IJ SOLN
INTRAMUSCULAR | Status: DC | PRN
  Administered 2016-08-30: 10 mL

## 2016-08-30 MED ORDER — CEFAZOLIN SODIUM 1 G IJ SOLR
INTRAMUSCULAR | Status: AC
  Filled 2016-08-30: qty 20

## 2016-08-30 MED ORDER — PHENYLEPHRINE HCL 10 MG/ML IJ SOLN
INTRAMUSCULAR | Status: DC | PRN
  Administered 2016-08-30 (×2): 80 ug via INTRAVENOUS

## 2016-08-30 MED ORDER — SUGAMMADEX SODIUM 200 MG/2ML IV SOLN
INTRAVENOUS | Status: DC | PRN
  Administered 2016-08-30: 200 mg via INTRAVENOUS

## 2016-08-30 MED ORDER — FENTANYL CITRATE (PF) 100 MCG/2ML IJ SOLN
25.0000 ug | INTRAMUSCULAR | Status: DC | PRN
Start: 2016-08-30 — End: 2016-08-31
  Administered 2016-08-30 (×2): 25 ug via INTRAVENOUS
  Filled 2016-08-30: qty 0.5

## 2016-08-30 MED ORDER — MONTELUKAST SODIUM 10 MG PO TABS
10.0000 mg | ORAL_TABLET | Freq: Every day | ORAL | Status: DC
  Administered 2016-08-30: 10 mg via ORAL
  Filled 2016-08-30 (×2): qty 1

## 2016-08-30 MED ORDER — ONDANSETRON HCL 4 MG PO TABS: 4.0000 mg | ORAL_TABLET | Freq: Four times a day (QID) | ORAL | Status: DC | PRN

## 2016-08-30 MED ORDER — THROMBIN 5000 UNITS EX SOLR
CUTANEOUS | Status: DC | PRN
  Administered 2016-08-30 (×2): 5000 [IU] via TOPICAL

## 2016-08-30 MED ORDER — LORATADINE 10 MG PO TABS
10.0000 mg | ORAL_TABLET | Freq: Every day | ORAL | Status: DC
  Administered 2016-08-30 – 2016-08-31 (×2): 10 mg via ORAL
  Filled 2016-08-30 (×2): qty 1

## 2016-08-30 MED ORDER — SODIUM CHLORIDE 0.9% FLUSH: 3.0000 mL | Freq: Two times a day (BID) | INTRAVENOUS | Status: DC

## 2016-08-30 MED ORDER — MORPHINE SULFATE (PF) 4 MG/ML IV SOLN: 2.0000 mg | INTRAVENOUS | Status: DC | PRN

## 2016-08-30 MED ORDER — ONDANSETRON HCL 4 MG/2ML IJ SOLN
INTRAMUSCULAR | Status: AC
  Filled 2016-08-30: qty 2

## 2016-08-30 MED ORDER — CYCLOBENZAPRINE HCL 10 MG PO TABS
10.0000 mg | ORAL_TABLET | Freq: Three times a day (TID) | ORAL | Status: DC | PRN
  Administered 2016-08-30: 10 mg via ORAL
  Filled 2016-08-30: qty 1

## 2016-08-30 MED ORDER — BACITRACIN ZINC 500 UNIT/GM EX OINT
TOPICAL_OINTMENT | CUTANEOUS | Status: AC
  Filled 2016-08-30: qty 28.35

## 2016-08-30 MED ORDER — CEFAZOLIN SODIUM-DEXTROSE 2-4 GM/100ML-% IV SOLN
2.0000 g | Freq: Three times a day (TID) | INTRAVENOUS | Status: DC
  Administered 2016-08-31: 2 g via INTRAVENOUS
  Filled 2016-08-30: qty 100

## 2016-08-30 MED ORDER — MUPIROCIN 2 % EX OINT: 1.0000 "application " | TOPICAL_OINTMENT | Freq: Once | CUTANEOUS | Status: AC

## 2016-08-30 MED ORDER — PANTOPRAZOLE SODIUM 40 MG PO TBEC
80.0000 mg | DELAYED_RELEASE_TABLET | Freq: Every day | ORAL | Status: DC
  Administered 2016-08-31: 80 mg via ORAL
  Filled 2016-08-30: qty 2

## 2016-08-30 MED ORDER — ONDANSETRON HCL 4 MG/2ML IJ SOLN: 4.0000 mg | Freq: Four times a day (QID) | INTRAMUSCULAR | Status: DC | PRN

## 2016-08-30 MED ORDER — FENTANYL CITRATE (PF) 100 MCG/2ML IJ SOLN: 50.0000 ug | Freq: Once | INTRAMUSCULAR | Status: DC

## 2016-08-30 MED ORDER — LACTATED RINGERS IV SOLN
INTRAVENOUS | Status: DC
  Administered 2016-08-30 (×2): via INTRAVENOUS

## 2016-08-30 MED ORDER — BISACODYL 10 MG RE SUPP: 10.0000 mg | Freq: Every day | RECTAL | Status: DC | PRN

## 2016-08-30 MED ORDER — ACETAMINOPHEN 325 MG PO TABS: 650.0000 mg | ORAL_TABLET | ORAL | Status: DC | PRN

## 2016-08-30 MED ORDER — SALINE SPRAY 0.65 % NA SOLN
1.0000 | Freq: Two times a day (BID) | NASAL | Status: DC
  Filled 2016-08-30: qty 44

## 2016-08-30 MED ORDER — PHENYLEPHRINE HCL 10 MG/ML IJ SOLN
INTRAVENOUS | Status: DC | PRN
  Administered 2016-08-30: 20 ug/min via INTRAVENOUS

## 2016-08-30 MED ORDER — QUINAPRIL HCL 10 MG PO TABS
40.0000 mg | ORAL_TABLET | Freq: Every day | ORAL | Status: DC
  Administered 2016-08-30 – 2016-08-31 (×2): 40 mg via ORAL
  Filled 2016-08-30 (×2): qty 4

## 2016-08-30 MED ORDER — GEMFIBROZIL 600 MG PO TABS
600.0000 mg | ORAL_TABLET | Freq: Two times a day (BID) | ORAL | Status: DC
  Administered 2016-08-31: 600 mg via ORAL
  Filled 2016-08-30 (×2): qty 1

## 2016-08-30 MED ORDER — ROCURONIUM BROMIDE 100 MG/10ML IV SOLN
INTRAVENOUS | Status: DC | PRN
  Administered 2016-08-30: 50 mg via INTRAVENOUS
  Administered 2016-08-30 (×2): 10 mg via INTRAVENOUS
  Administered 2016-08-30: 20 mg via INTRAVENOUS

## 2016-08-30 MED ORDER — INSULIN ASPART 100 UNIT/ML ~~LOC~~ SOLN: 0.0000 [IU] | Freq: Every day | SUBCUTANEOUS | Status: DC

## 2016-08-30 MED ORDER — SODIUM CHLORIDE 0.9 % IR SOLN
Status: DC | PRN
  Administered 2016-08-30: 18:00:00

## 2016-08-30 MED ORDER — PROPOFOL 10 MG/ML IV BOLUS
INTRAVENOUS | Status: DC | PRN
  Administered 2016-08-30: 150 mg via INTRAVENOUS

## 2016-08-30 MED ORDER — MENTHOL 3 MG MT LOZG: 1.0000 | LOZENGE | OROMUCOSAL | Status: DC | PRN

## 2016-08-30 MED ORDER — FENTANYL CITRATE (PF) 250 MCG/5ML IJ SOLN
INTRAMUSCULAR | Status: AC
  Filled 2016-08-30: qty 5

## 2016-08-30 MED ORDER — CEFAZOLIN SODIUM-DEXTROSE 2-3 GM-% IV SOLR
INTRAVENOUS | Status: DC | PRN
  Administered 2016-08-30: 2 g via INTRAVENOUS

## 2016-08-30 MED ORDER — SODIUM CHLORIDE 0.9 % IV SOLN: 250.0000 mL | INTRAVENOUS | Status: DC

## 2016-08-30 MED ORDER — 0.9 % SODIUM CHLORIDE (POUR BTL) OPTIME
TOPICAL | Status: DC | PRN
  Administered 2016-08-30: 1000 mL

## 2016-08-30 SURGICAL SUPPLY — 52 items
BAG DECANTER FOR FLEXI CONT (MISCELLANEOUS) ×3 IMPLANT
BENZOIN TINCTURE PRP APPL 2/3 (GAUZE/BANDAGES/DRESSINGS) ×3 IMPLANT
BLADE CLIPPER SURG (BLADE) IMPLANT
BUR MATCHSTICK NEURO 3.0 LAGG (BURR) ×3 IMPLANT
BUR PRECISION FLUTE 6.0 (BURR) ×3 IMPLANT
CANISTER SUCT 3000ML PPV (MISCELLANEOUS) ×3 IMPLANT
CARTRIDGE OIL MAESTRO DRILL (MISCELLANEOUS) ×1 IMPLANT
CLOSURE WOUND 1/2 X4 (GAUZE/BANDAGES/DRESSINGS) ×1
DIFFUSER DRILL AIR PNEUMATIC (MISCELLANEOUS) ×3 IMPLANT
DRAPE LAPAROTOMY 100X72X124 (DRAPES) ×3 IMPLANT
DRAPE MICROSCOPE LEICA (MISCELLANEOUS) ×3 IMPLANT
DRAPE POUCH INSTRU U-SHP 10X18 (DRAPES) ×3 IMPLANT
DRAPE SURG 17X23 STRL (DRAPES) ×12 IMPLANT
ELECT BLADE 4.0 EZ CLEAN MEGAD (MISCELLANEOUS) ×3
ELECT REM PT RETURN 9FT ADLT (ELECTROSURGICAL) ×3
ELECTRODE BLDE 4.0 EZ CLN MEGD (MISCELLANEOUS) ×1 IMPLANT
ELECTRODE REM PT RTRN 9FT ADLT (ELECTROSURGICAL) ×1 IMPLANT
GAUZE SPONGE 4X4 12PLY STRL (GAUZE/BANDAGES/DRESSINGS) ×3 IMPLANT
GAUZE SPONGE 4X4 16PLY XRAY LF (GAUZE/BANDAGES/DRESSINGS) IMPLANT
GLOVE BIO SURGEON STRL SZ 6.5 (GLOVE) ×4 IMPLANT
GLOVE BIO SURGEON STRL SZ8 (GLOVE) ×3 IMPLANT
GLOVE BIO SURGEON STRL SZ8.5 (GLOVE) ×3 IMPLANT
GLOVE BIO SURGEONS STRL SZ 6.5 (GLOVE) ×2
GLOVE BIOGEL PI IND STRL 6.5 (GLOVE) ×1 IMPLANT
GLOVE BIOGEL PI INDICATOR 6.5 (GLOVE) ×2
GLOVE EXAM NITRILE LRG STRL (GLOVE) IMPLANT
GLOVE EXAM NITRILE XL STR (GLOVE) IMPLANT
GLOVE EXAM NITRILE XS STR PU (GLOVE) IMPLANT
GOWN STRL REUS W/ TWL LRG LVL3 (GOWN DISPOSABLE) ×1 IMPLANT
GOWN STRL REUS W/ TWL XL LVL3 (GOWN DISPOSABLE) ×1 IMPLANT
GOWN STRL REUS W/TWL 2XL LVL3 (GOWN DISPOSABLE) IMPLANT
GOWN STRL REUS W/TWL LRG LVL3 (GOWN DISPOSABLE) ×2
GOWN STRL REUS W/TWL XL LVL3 (GOWN DISPOSABLE) ×2
HEMOSTAT POWDER KIT SURGIFOAM (HEMOSTASIS) ×3 IMPLANT
KIT BASIN OR (CUSTOM PROCEDURE TRAY) ×3 IMPLANT
KIT ROOM TURNOVER OR (KITS) ×3 IMPLANT
NEEDLE HYPO 21X1.5 SAFETY (NEEDLE) IMPLANT
NEEDLE HYPO 22GX1.5 SAFETY (NEEDLE) ×3 IMPLANT
NS IRRIG 1000ML POUR BTL (IV SOLUTION) ×3 IMPLANT
OIL CARTRIDGE MAESTRO DRILL (MISCELLANEOUS) ×3
PACK LAMINECTOMY NEURO (CUSTOM PROCEDURE TRAY) ×3 IMPLANT
PAD ARMBOARD 7.5X6 YLW CONV (MISCELLANEOUS) ×9 IMPLANT
PATTIES SURGICAL .5 X1 (DISPOSABLE) IMPLANT
RUBBERBAND STERILE (MISCELLANEOUS) ×6 IMPLANT
SPONGE SURGIFOAM ABS GEL SZ50 (HEMOSTASIS) ×3 IMPLANT
STRIP CLOSURE SKIN 1/2X4 (GAUZE/BANDAGES/DRESSINGS) ×2 IMPLANT
SUT VIC AB 1 CT1 18XBRD ANBCTR (SUTURE) ×1 IMPLANT
SUT VIC AB 1 CT1 8-18 (SUTURE) ×2
SUT VIC AB 2-0 CP2 18 (SUTURE) ×3 IMPLANT
TOWEL GREEN STERILE (TOWEL DISPOSABLE) ×3 IMPLANT
TOWEL GREEN STERILE FF (TOWEL DISPOSABLE) ×3 IMPLANT
WATER STERILE IRR 1000ML POUR (IV SOLUTION) ×3 IMPLANT

## 2016-08-30 NOTE — Transfer of Care (Signed)
Immediate Anesthesia Transfer of Care Note  Patient: Ronalee RedJames L Busbin  Procedure(s) Performed: Procedure(s): Right lumbar three-four microdiscectomy (Right)  Patient Location: PACU  Anesthesia Type:General  Level of Consciousness: awake and alert   Airway & Oxygen Therapy: Patient Spontanous Breathing and Patient connected to face mask oxygen  Post-op Assessment: Report given to RN and Post -op Vital signs reviewed and stable  Post vital signs: Reviewed and stable  Last Vitals:  Vitals:   08/30/16 1357  BP: (!) 166/87  Pulse: 89  Resp: 18  Temp: 36.7 C  SpO2: 95%    Last Pain:  Vitals:   08/30/16 1544  TempSrc:   PainSc: 3       Patients Stated Pain Goal: 3 (08/30/16 1530)  Complications: No apparent anesthesia complications

## 2016-08-30 NOTE — Op Note (Signed)
Brief history: The patient is a 81 year old white male on whom I performed an L4-5 compression and fusion years ago. He did well but has developed severe right leg pain consistent with a lumbar radiculopathy. He has failed medical management and was worked up with a lumbar MRI. This demonstrated a large herniated disc at L3-4 on the right. I discussed the various treatment options with the patient including surgery. He has weighed the risks, benefits, and alternative surgery and decided proceed with a right L3-4 discectomy.  Preoperative diagnosis: Right L3-4 herniated disc, lumbar radiculopathy, lumbago  Postoperative diagnosis: The same  Procedure: Right L3-4 Intervertebral discectomy using micro-dissection  Surgeon: Dr. Delma OfficerJeff Takiya Belmares  Asst.: None  Anesthesia: Gen. endotracheal  Estimated blood loss: 50 mL  Drains: None  Complications: None  Description of procedure: The patient was brought to the operating room by the anesthesia team. General endotracheal anesthesia was induced. The patient was turned to the prone position on the Wilson frame. The patient's lumbosacral region was then prepared with Betadine scrub and Betadine solution. Sterile drapes were applied.  I then injected the area to be incised with Marcaine with epinephrine solution. I then used a scalpel to make a linear midline incision over the L3-4 intervertebral disc space, incising through his old surgical scar. I then used electrocautery to perform a right sided subperiosteal dissection exposing the spinous process and lamina of L3 . We obtained intraoperative radiograph to confirm our location. I then inserted the [facility] retractor for exposure.  We then brought the operative microscope into the field. Under its magnification and illumination we completed the microdissection. I used a high-speed drill to perform a laminotomy at right L3. I then used a Kerrison punches to widen the laminotomy and removed the ligamentum  flavum at L3-4. We then used microdissection to free up the thecal sac and the right L3 and L4 nerve root from the epidural tissue. I then used a Kerrison punch to perform a foraminotomy at about the right L3 and L4 nerve root. We then using the nerve root retractor to gently retract the thecal sac  medially. This exposed the intervertebral disc in the axilla of the L3 nerve root. We identified the ruptured disc and remove it with the pituitary forceps. We performed a partial intervertebral sac to me at L3-4 on the right. The intervertebral disc was quite degenerated.  I then palpated along the ventral surface of the thecal sac and along exit route of the right L3 and L4 nerve root and noted that the neural structures were well decompressed. This completed the decompression.  We then obtained hemostasis using bipolar electrocautery. We irrigated the wound out with bacitracin solution. We then removed the retractor. We then reapproximated the patient's thoracolumbar fascia with interrupted #1 Vicryl suture. We then reapproximated the patient's subcutaneous tissue with interrupted 2-0 Vicryl suture. We then reapproximated patient's skin with Steri-Strips and benzoin. The was then coated with bacitracin ointment. The drapes were removed. The patient was subsequently returned to the supine position where they were extubated by the anesthesia team. The patient was then transported to the postanesthesia care unit in stable condition. All sponge instrument and needle counts were reportedly correct at the end of this case.

## 2016-08-30 NOTE — Anesthesia Preprocedure Evaluation (Addendum)
Anesthesia Evaluation  Patient identified by MRN, date of birth, ID band Patient awake    Reviewed: Allergy & Precautions, H&P , NPO status , Patient's Chart, lab work & pertinent test results  Airway Mallampati: II  TM Distance: >3 FB Neck ROM: Full    Dental no notable dental hx. (+) Upper Dentures, Lower Dentures, Dental Advisory Given   Pulmonary neg pulmonary ROS, former smoker,    Pulmonary exam normal breath sounds clear to auscultation       Cardiovascular hypertension, Pt. on medications  Rhythm:Regular Rate:Normal     Neuro/Psych negative neurological ROS  negative psych ROS   GI/Hepatic Neg liver ROS, GERD  Medicated and Controlled,  Endo/Other  diabetes, Type 2, Oral Hypoglycemic Agents  Renal/GU negative Renal ROS  negative genitourinary   Musculoskeletal  (+) Arthritis , Osteoarthritis,    Abdominal   Peds  Hematology negative hematology ROS (+)   Anesthesia Other Findings   Reproductive/Obstetrics negative OB ROS                            Anesthesia Physical Anesthesia Plan  ASA: II  Anesthesia Plan: General   Post-op Pain Management:    Induction: Intravenous  PONV Risk Score and Plan: 3 and Ondansetron, Dexamethasone and Midazolam  Airway Management Planned: Oral ETT  Additional Equipment:   Intra-op Plan:   Post-operative Plan: Extubation in OR  Informed Consent: I have reviewed the patients History and Physical, chart, labs and discussed the procedure including the risks, benefits and alternatives for the proposed anesthesia with the patient or authorized representative who has indicated his/her understanding and acceptance.   Dental advisory given  Plan Discussed with: CRNA  Anesthesia Plan Comments:        Anesthesia Quick Evaluation

## 2016-08-30 NOTE — H&P (Signed)
Subjective: The patient is a 81-old white male on whom I previously performed an L4-5 instrumentation and fusion. The patient did well but recently has developed severe right leg pain. He has failed medical management and was worked up with a lumbar MRI which demonstrated a large herniated disc at L3-4 on the right. I discussed the various treatment options with the patient. He has decided to proceed with surgery.   Past Medical History:  Diagnosis Date  . Arthritis   . Complication of anesthesia    2014 BP DROPPED S/P BACK SURGERY REC. IV FLUIDS  . Diabetes mellitus without complication (HCC)   . Disc displacement, lumbar   . Full dentures   . GERD (gastroesophageal reflux disease)   . Hearing deficit    does not wear H/A  . History of kidney stones   . Hypertension   . Kidney stone   . Neuromuscular disorder (HCC)   . Pneumonia   . Umbilical hernia   . Wears glasses     Past Surgical History:  Procedure Laterality Date  . BACK SURGERY    . CATARACT EXTRACTION W/ INTRAOCULAR LENS  IMPLANT, BILATERAL    . COLONOSCOPY W/ BIOPSIES AND POLYPECTOMY    . MULTIPLE TOOTH EXTRACTIONS    . NOSE SURGERY     deviated septum, and blockage  . rottor cuff repair      left     Allergies  Allergen Reactions  . Naproxen Itching and Rash  . Roxicet [Oxycodone-Acetaminophen] Itching and Rash    Social History  Substance Use Topics  . Smoking status: Former Smoker    Types: Cigarettes  . Smokeless tobacco: Former Neurosurgeon    Types: Chew     Comment: Quit smoking  in 1987  . Alcohol use No    Family History  Problem Relation Age of Onset  . Stroke Mother   . Heart disease Father   . Aneurysm Father   . Aplastic anemia Sister   . Heart disease Other    Prior to Admission medications   Medication Sig Start Date End Date Taking? Authorizing Provider  acetaminophen (TYLENOL 8 HOUR ARTHRITIS PAIN) 650 MG CR tablet Take 650 mg by mouth 3 (three) times daily.   Yes [provider]   aspirin EC 81 MG tablet Take 81 mg by mouth at bedtime.    Yes [provider]  atorvastatin (LIPITOR) 10 MG tablet Take 10 mg by mouth daily with lunch. 07/25/16  Yes [provider]  B Complex Vitamins (B COMPLEX 100 PO) Take 1 tablet by mouth daily at 2 PM.   Yes [provider]  chlorthalidone (HYGROTON) 25 MG tablet Take 25 mg by mouth daily.   Yes [provider]  Cholecalciferol (VITAMIN D3) 2000 units TABS Take 2,000 Units by mouth daily at 2 PM.   Yes [provider]  fexofenadine (ALLEGRA) 180 MG tablet Take 180 mg by mouth daily as needed (for allergies.).    Yes [provider]  gemfibrozil (LOPID) 600 MG tablet Take 600 mg by mouth 2 (two) times daily before a meal.   Yes [provider]  HYDROcodone-acetaminophen (NORCO/VICODIN) 5-325 MG tablet Take 1 tablet by mouth 3 (three) times daily as needed. For pain. 08/26/16  Yes [provider]  metFORMIN (GLUCOPHAGE) 1000 MG tablet Take 1,000 mg by mouth 2 (two) times daily. 07/25/16  Yes [provider]  montelukast (SINGULAIR) 10 MG tablet Take 10 mg by mouth daily at 2 PM.  Yes [provider]  Multiple Vitamin (MULTIVITAMIN WITH MINERALS) TABS tablet Take 1 tablet by mouth daily.   Yes [provider]  Omega-3 Fatty Acids (FISH OIL) 1000 MG CAPS Take 1,000 mg by mouth every evening.   Yes [provider]  omeprazole (PRILOSEC) 40 MG capsule Take 40 mg by mouth daily before breakfast.    Yes [provider]  quinapril (ACCUPRIL) 40 MG tablet Take 40 mg by mouth daily.   Yes [provider]  sodium chloride (OCEAN) 0.65 % SOLN nasal spray Place 1 spray into both nostrils 2 (two) times daily. Saline Mixture patient makes.   Yes [provider]  vitamin C (ASCORBIC ACID) 500 MG tablet Take 500 mg by mouth daily at 2 PM.   Yes [provider]  levofloxacin (LEVAQUIN) 500 MG tablet Take 500 mg by mouth  daily as needed (for urinary discomfort).    [provider]     Review of Systems  Positive ROS: As above  All other systems have been reviewed and were otherwise negative with the exception of those mentioned in the HPI and as above.  Objective: Vital signs in last 24 hours: Temp:  [98.1 F (36.7 C)] 98.1 F (36.7 C) (08/14 1357) Pulse Rate:  [89] 89 (08/14 1357) Resp:  [18] 18 (08/14 1357) BP: (166)/(87) 166/87 (08/14 1357) SpO2:  [95 %] 95 % (08/14 1357) Weight:  [88.5 kg (195 lb)] 88.5 kg (195 lb) (08/14 1347)  General Appearance: Alert Head: Normocephalic, without obvious abnormality, atraumatic Eyes: PERRL, conjunctiva/corneas clear, EOM's intact,    Ears: Normal  Throat: Normal  Neck: Supple, Back: unremarkable, his lumbar incision is well-healed. Lungs: Clear to auscultation bilaterally, respirations unlabored Heart: Regular rate and rhythm, no murmur, rub or gallop Abdomen: Soft, non-tender Extremities: Extremities normal, atraumatic, no cyanosis or edema Skin: unremarkable  NEUROLOGIC:   Mental status: alert and oriented,Motor Exam - grossly normal, except he has weakness in his right quadricep Sensory Exam - grossly normal, except he has numbness in the right L4 distribution Reflexes:  Coordination - grossly normal Gait - grossly normal Balance - grossly normal Cranial Nerves: I: smell Not tested  II: visual acuity  OS: Normal  OD: Normal   II: visual fields Full to confrontation  II: pupils Equal, round, reactive to light  III,VII: ptosis None  III,IV,VI: extraocular muscles  Full ROM  V: mastication Normal  V: facial light touch sensation  Normal  V,VII: corneal reflex  Present  VII: facial muscle function - upper  Normal  VII: facial muscle function - lower Normal  VIII: hearing Not tested  IX: soft palate elevation  Normal  IX,X: gag reflex Present  XI: trapezius strength  5/5  XI: sternocleidomastoid strength 5/5  XI: neck flexion  strength  5/5  XII: tongue strength  Normal    Data Review Lab Results  Component Value Date   WBC 8.0 08/30/2016   HGB 16.5 08/30/2016   HCT 46.5 08/30/2016   MCV 88.7 08/30/2016   PLT 250 08/30/2016   Lab Results  Component Value Date   NA 133 (L) 08/30/2016   K 4.2 08/30/2016   CL 99 (L) 08/30/2016   CO2 21 (L) 08/30/2016   BUN 14 08/30/2016   CREATININE 0.67 08/30/2016   GLUCOSE 106 (H) 08/30/2016   No results found for: INR, PROTIME  Assessment/Plan: Right L3-4 herniated disc, lumbar radiculopathy, lumbago: I have discussed the situation with the patient and his wife and reviewed  the MRI scan with him. We have discussed various treatment options including a right L3-4 discectomy. I have described the surgery to them. I have shown the surgical models. We have discussed the risks, benefits, alternatives, expected postoperative course, and likelihood of achieving our goals for surgery. I have answered all their questions. He has decided to proceed with surgery.   Olufemi Mofield D 08/30/2016 4:03 PM

## 2016-08-30 NOTE — Anesthesia Procedure Notes (Signed)
Procedure Name: Intubation Date/Time: 08/30/2016 4:34 PM Performed by: Rejeana Brock L Pre-anesthesia Checklist: Patient identified, Emergency Drugs available, Suction available and Patient being monitored Patient Re-evaluated:Patient Re-evaluated prior to induction Oxygen Delivery Method: Circle System Utilized Preoxygenation: Pre-oxygenation with 100% oxygen Induction Type: IV induction Ventilation: Mask ventilation without difficulty and Oral airway inserted - appropriate to patient size Laryngoscope Size: Mac and 4 Grade View: Grade I Tube type: Oral Tube size: 7.5 mm Number of attempts: 1 Airway Equipment and Method: Stylet and Oral airway Placement Confirmation: ETT inserted through vocal cords under direct vision,  positive ETCO2 and breath sounds checked- equal and bilateral Secured at: 21 cm Tube secured with: Tape Dental Injury: Teeth and Oropharynx as per pre-operative assessment

## 2016-08-31 ENCOUNTER — Encounter (HOSPITAL_COMMUNITY): Payer: Self-pay | Admitting: Neurosurgery

## 2016-08-31 DIAGNOSIS — M5116 Intervertebral disc disorders with radiculopathy, lumbar region: Secondary | ICD-10-CM | POA: Diagnosis not present

## 2016-08-31 LAB — GLUCOSE, CAPILLARY: Glucose-Capillary: 102 mg/dL — ABNORMAL HIGH (ref 65–99)

## 2016-08-31 MED ORDER — CYCLOBENZAPRINE HCL 10 MG PO TABS: 10.0000 mg | ORAL_TABLET | Freq: Three times a day (TID) | ORAL | 0 refills | Status: DC | PRN

## 2016-08-31 MED ORDER — HYDROCODONE-ACETAMINOPHEN 5-325 MG PO TABS: 1.0000 | ORAL_TABLET | ORAL | 0 refills | Status: DC | PRN

## 2016-08-31 NOTE — Evaluation (Signed)
Physical Therapy Evaluation Patient Details Name: Reginald [name] MRN: [id number] DOB: 1935/06/26 Today's Date: 08/31/2016   History of Present Illness  Pt is a 81 y/o male with a PMH of L4-L5 PLIF in 2016. He now presents s/p L3-L4 laminectomy/decompression and microdiscectomy on 08/30/16.  Clinical Impression  Pt admitted with above diagnosis. Pt currently with functional limitations due to the deficits listed below (see PT Problem List). At the time of PT eval pt was able to perform transfers and ambulation with gross min guard assist for balance support and safety. Pt showing increased stability with SPC use and endorses feeling of low balance at this time. Pt will benefit from skilled PT to increase their independence and safety with mobility to allow discharge to the venue listed below.       Follow Up Recommendations No PT follow up;Supervision for mobility/OOB    Equipment Recommendations  None recommended by PT    Recommendations for Other Services       Precautions / Restrictions Precautions Precautions: Fall;Back Precaution Booklet Issued: Yes (comment) Precaution Comments: Provided handout and pt was cued for precautions throughout functional mobility.  Required Braces or Orthoses:  (No brace needed order) Restrictions Weight Bearing Restrictions: No      Mobility  Bed Mobility Overal bed mobility: Needs Assistance Bed Mobility: Rolling;Sidelying to Sit Rolling: Supervision Sidelying to sit: Supervision       General bed mobility comments: VC's for sequencing and proper log roll technique. Pt was able to transition to EOB with close supervision for safety.   Transfers Overall transfer level: Needs assistance Equipment used: Straight cane Transfers: Sit to/from Stand Sit to Stand: Min guard         General transfer comment: Close guard for safety. Pt initially stood without SPC and immediately reached for support. Hands on support required.    Ambulation/Gait Ambulation/Gait assistance: Min guard Ambulation Distance (Feet): 200 Feet Assistive device: Straight cane Gait Pattern/deviations: Step-through pattern;Decreased stride length;Trunk flexed Gait velocity: Decreased Gait velocity interpretation: Below normal speed for age/gender General Gait Details: Pt was able to ambulate with improved stability with SPC. However, continued to demonstrate grossly flexed posture, and a shuffle-like gait pattern.   Stairs            Wheelchair Mobility    Modified Rankin (Stroke Patients Only)       Balance Overall balance assessment: Needs assistance Sitting-balance support: Feet supported;No upper extremity supported Sitting balance-Leahy Scale: Fair     Standing balance support: No upper extremity supported Standing balance-Leahy Scale: Fair                               Pertinent Vitals/Pain Pain Assessment: Faces Faces Pain Scale: Hurts a little bit Pain Location: Incision site Pain Descriptors / Indicators: Operative site guarding;Discomfort Pain Intervention(s): Limited activity within patient's tolerance;Monitored during session;Repositioned    Home Living Family/patient expects to be discharged to:: Private residence Living Arrangements: Spouse/significant other Available Help at Discharge: Family;Available 24 hours/day Type of Home: House Home Access: Stairs to enter   [employer] of Steps: 1 Home Layout: One level Home Equipment: Shower seat - built in;Walker - 2 wheels;Cane - single point      Prior Function Level of Independence: Independent with assistive device(s)         Comments: Typically independent however using the [facility] over the last 6 weeks 2 pain     Hand Dominance  Dominant Hand: Right    Extremity/Trunk Assessment   Upper Extremity Assessment Upper Extremity Assessment: Overall WFL for tasks assessed         Cervical / Trunk  Assessment Cervical / Trunk Assessment: Other exceptions Cervical / Trunk Exceptions: s/p surgery  Communication   Communication: No difficulties  Cognition Arousal/Alertness: Awake/alert Behavior During Therapy: WFL for tasks assessed/performed Overall Cognitive Status: Within Functional Limits for tasks assessed                                        General Comments      Exercises     Assessment/Plan    PT Assessment Patient needs continued PT services  PT Problem List Decreased range of motion;Decreased strength;Decreased activity tolerance;Decreased balance;Decreased mobility;Decreased knowledge of use of DME;Decreased safety awareness;Decreased knowledge of precautions;Pain       PT Treatment Interventions DME instruction;Gait training;Stair training;Functional mobility training;Therapeutic activities;Therapeutic exercise;Neuromuscular re-education;Patient/family education    PT Goals (Current goals can be found in the Care Plan section)  Acute Rehab PT Goals Patient Stated Goal[facility] PT Goal Formulation: With patient/family Time For Goal Achievement: 09/07/16 Potential to Achieve Goals: Good    Frequency Min 5X/week   Barriers to discharge        Co-evaluation               AM-PAC PT "6 Clicks" Daily Activity  Outcome Measure Difficulty turning over in bed (including adjusting bedclothes, sheets and blankets)?: None Difficulty moving from lying on back to sitting on the side of the bed? : A Little Difficulty sitting down on and standing up from a chair with arms (e.g., wheelchair, bedside commode, etc,.)?: A Little Help needed moving to and from a bed to chair (including a wheelchair)?: A Little Help needed walking in hospital room?: A Little Help needed climbing 3-5 steps with a railing? : A Little 6 Click Score: 19    End of Session Equipment Utilized During Treatment: Gait belt Activity Tolerance: Patient tolerated  treatment well Patient left: in chair;with call bell/phone within reach;with family/visitor present Nurse Communication: Mobility status PT Visit Diagnosis: Unsteadiness on feet (R26.81);Pain;Other symptoms and signs involving the nervous system (R29.898) Pain - part of body:  (back)    Time: [id number] PT Time Calculation (min) (ACUTE ONLY): 24 min   Charges:   PT Evaluation $PT Eval Moderate Complexity: 1 Mod PT Treatments $Gait Training: 8-22 mins   PT G Codes:   PT G-Codes **NOT FOR INPATIENT CLASS** Functional Assessment Tool Used: Clinical judgement Functional Limitation: Mobility: Walking and moving around Mobility: Walking and Moving Around Current Status ([id number](G8978): At least 20 percent but less than 40 percent impaired, limited or restricted Mobility: Walking and Moving Around Goal Status [phone](G8979): At least 1 percent but less than 20 percent impaired, limited or restricted    Conni SlipperLaura Xylina Rhoads, PT, DPT Acute Rehabilitation Services Pager: [phone]   Marylynn PearsonLaura D Delmi Fulfer 08/31/2016, 10:21 AM

## 2016-08-31 NOTE — Discharge Summary (Signed)
Physician Discharge Summary  Patient ID: Reginald [name] MRN: [id number] DOB/AGE: 79/03/1935 79 y.o.  Admit date: 08/30/2016 Discharge date: 08/31/2016  Admission Diagnoses:  HNP Lumbar  Discharge Diagnoses:  Same Active Problems:   Lumbar herniated disc  Discharged Condition: Stable  Hospital Course:  Reginald [name] is a 81 y.o. male  who was admitted for the below procedure. There were no post operative complications. At time of discharge, pain was well controlled, ambulating with Pt/OT, tolerating po, voiding normal. Ready for discharge.  Treatments: Surgery - Right L3-4 Intervertebral discectomy using micro-dissection  Discharge Exam: Blood pressure 125/75, pulse 80, temperature 98 F (36.7 C), temperature source Oral, resp. rate 18, height 5\' 8"  (1.727 m), weight 88.5 kg (195 lb), SpO2 95 %. Awake, alert, oriented Speech fluent, appropriate CN grossly intact MAEW with good strength Wound c/d/i  Disposition: 01-Home or Self Care  Discharge Instructions    Call MD for:  difficulty breathing, headache or visual disturbances    Complete by:  As directed    Call MD for:  persistant dizziness or light-headedness    Complete by:  As directed    Call MD for:  redness, tenderness, or signs of infection (pain, swelling, redness, odor or green/yellow discharge around incision site)    Complete by:  As directed    Call MD for:  severe uncontrolled pain    Complete by:  As directed    Call MD for:  temperature >100.4    Complete by:  As directed    Diet general    Complete by:  As directed    Driving Restrictions    Complete by:  As directed    Do not drive until given clearance.   Increase activity slowly    Complete by:  As directed    Lifting restrictions    Complete by:  As directed    Do not lift anything >10lbs. Avoid bending and twisting in awkward positions. Avoid bending at the back.   May shower / Bathe    Complete by:  As directed    Keep wound clean      Allergies as of 08/31/2016      Reactions   Naproxen Itching, Rash   Roxicet [oxycodone-acetaminophen] Itching, Rash      Medication List    TAKE these medications   aspirin EC 81 MG tablet Take 81 mg by mouth at bedtime.   atorvastatin 10 MG tablet Commonly known as:  LIPITOR Take 10 mg by mouth daily with lunch.   B COMPLEX 100 PO Take 1 tablet by mouth daily at 2 PM.   chlorthalidone 25 MG tablet Commonly known as:  HYGROTON Take 25 mg by mouth daily.   cyclobenzaprine 10 MG tablet Commonly known as:  FLEXERIL Take 1 tablet (10 mg total) by mouth 3 (three) times daily as needed for muscle spasms.   fexofenadine 180 MG tablet Commonly known as:  ALLEGRA Take 180 mg by mouth daily as needed (for allergies.).   Fish Oil 1000 MG Caps Take 1,000 mg by mouth every evening.   gemfibrozil 600 MG tablet Commonly known as:  LOPID Take 600 mg by mouth 2 (two) times daily before a meal.   HYDROcodone-acetaminophen 5-325 MG tablet Commonly known as:  NORCO/VICODIN Take 1 tablet by mouth every 4 (four) hours as needed. For pain. What changed:  when to take this   levofloxacin 500 MG tablet Commonly known as:  LEVAQUIN Take 500 mg by mouth daily as  needed (for urinary discomfort).   metFORMIN 1000 MG tablet Commonly known as:  GLUCOPHAGE Take 1,000 mg by mouth 2 (two) times daily.   montelukast 10 MG tablet Commonly known as:  SINGULAIR Take 10 mg by mouth daily at 2 PM.   multivitamin with minerals Tabs tablet Take 1 tablet by mouth daily.   omeprazole 40 MG capsule Commonly known as:  PRILOSEC Take 40 mg by mouth daily before breakfast.   quinapril 40 MG tablet Commonly known as:  ACCUPRIL Take 40 mg by mouth daily.   sodium chloride 0.65 % Soln nasal spray Commonly known as:  OCEAN Place 1 spray into both nostrils 2 (two) times daily. Saline Mixture patient makes.   TYLENOL 8 HOUR ARTHRITIS PAIN 650 MG CR tablet Generic drug:  acetaminophen Take 650  mg by mouth 3 (three) times daily.   vitamin C 500 MG tablet Commonly known as:  ASCORBIC ACID Take 500 mg by mouth daily at 2 PM.   Vitamin D3 2000 units Tabs Take 2,000 Units by mouth daily at 2 PM.      Follow-up Information    Tressie Stalker, MD Follow up.   Specialty:  Neurosurgery Contact information: [address] [place] [address] [phone]           Signed: Alyson Ingles 08/31/2016, 11:57 AM

## 2016-08-31 NOTE — Discharge Instructions (Signed)

## 2016-08-31 NOTE — Anesthesia Postprocedure Evaluation (Signed)
Anesthesia Post Note  Patient: Ronalee RedJames L Hatfield  Procedure(s) Performed: Procedure(s) (LRB): Right lumbar three-four microdiscectomy (Right)     Patient location during evaluation: PACU Anesthesia Type: General Level of consciousness: awake and alert Pain management: pain level controlled Vital Signs Assessment: post-procedure vital signs reviewed and stable Respiratory status: spontaneous breathing, nonlabored ventilation, respiratory function stable and patient connected to nasal cannula oxygen Cardiovascular status: blood pressure returned to baseline and stable Postop Assessment: no signs of nausea or vomiting Anesthetic complications: no    Last Vitals:  Vitals:   08/30/16 2337 08/31/16 0400  BP: 108/70 112/63  Pulse: 87 86  Resp: 18 18  Temp: 36.9 C 36.5 C  SpO2: 95% 92%    Last Pain:  Vitals:   08/31/16 0626  TempSrc:   PainSc: 6                  Kennieth RadFitzgerald, Romon Devereux E

## 2016-08-31 NOTE — Progress Notes (Signed)
Pt doing well. Pt and wife given D/C instructions with Rx's, verbal understanding was provided. Pt's incision is clean and dry with no sign of infection. Pt's IV was removed prior to D/C. Pt D/C'd home via wheelchair @ 1220 per MD order. Pt is stable @ D/C and has no other needs at this time. Rema FendtAshley Deandra Goering, RN

## 2016-09-01 LAB — HEMOGLOBIN A1C
HEMOGLOBIN A1C: 6.8 % — AB (ref 4.8–5.6)
Mean Plasma Glucose: 148 mg/dL

## 2017-10-01 IMAGING — CR DG LUMBAR SPINE 1V
1 series · 1 of 1 positions shown · non-contrast
Comparison: Preoperative radiographs 08/04/2016

CLINICAL DATA: Elective surgery.  Right L3-L4 microdiscectomy.

EXAM:
LUMBAR SPINE - 1 VIEW

[xtable lateral]
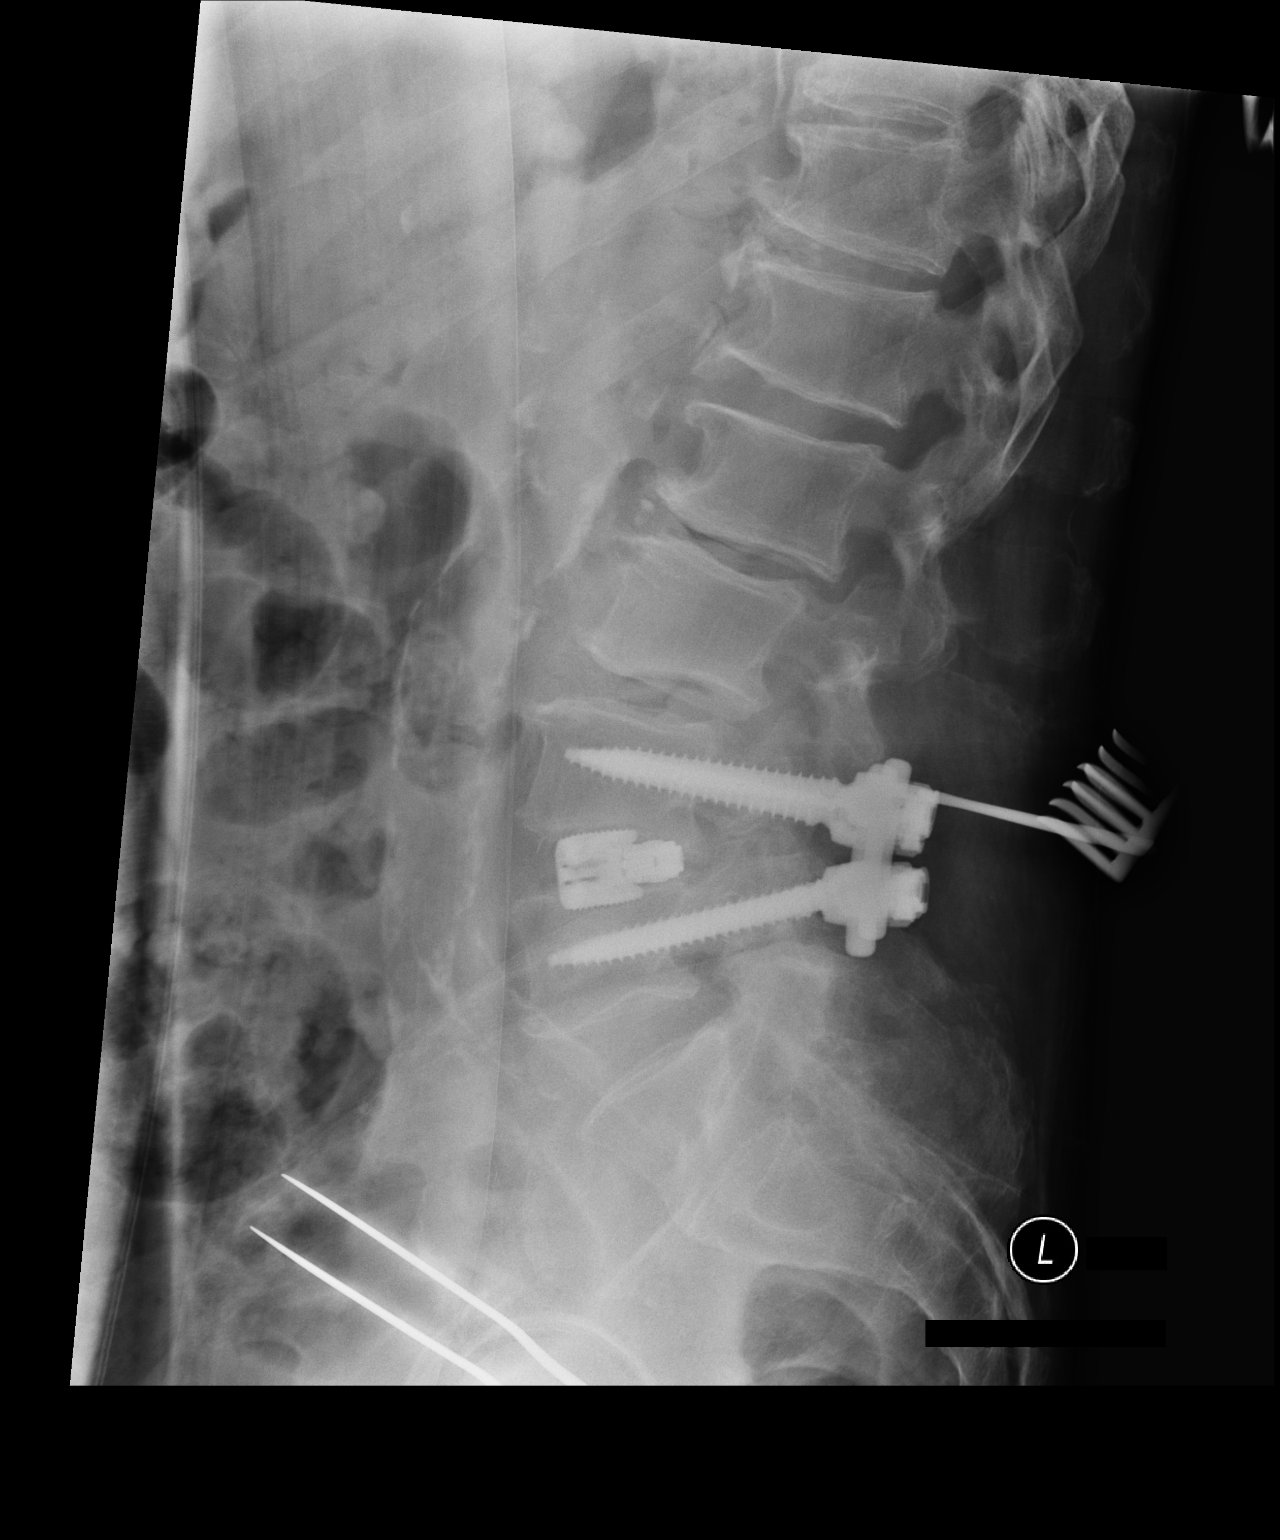

[1 of 1 positions shown; findings below may reference images not displayed]

FINDINGS: Surgical instrument localizes posterior to L3-L4. Prior L4-L5
posterior fusion with rods, intrapedicular screws and interbody
spacer. Multilevel endplate spurring. Atherosclerosis of abdominal
aorta.
IMPRESSION: Surgical instrument localizes posterior to L3-L4.

## 2024-01-02 NOTE — Consults (Addendum)
 General Surgery Consult:   Patient Name: Reginald [name] Patient Age: 88 y.o. Consultation Date: 01/02/2024  Consulting Physician: Charlena Cart, MD Service Requesting Consult:  Emergency Medicine  Primary Care Provider: Martine Andrez Moats, MD    REASON FOR CONSULT:    Ischemic right foot with concern of cellulitis   HISTORY OF PRESENT ILLNESS:   Mr. Reginald [name] is a 88 y.o. male seen in consultation at the request of Reginald Fitting PA for evaluation of ischemic right foot with concern of cellulitis.  Reginald [name] is a very pleasant but extremely medically complex patient of Dr. Burnard Reginald.  He has known coronary artery disease with a prior aortocoronary bypass graft, chronic systolic heart failure with most recent ejection fraction at 30%, interstitial lung disease with oxygen at 5 L/min which he and his wife report has improved significantly since addition of CellCept 1000 mg twice daily, diabetes mellitus and severe peripheral artery disease.  Reginald [name] reports that he has had changes of weakness in his left foot since prior back surgery.  His right foot was always his good foot until 6 to 8 months ago when he developed a small black area on the tip of his right great toe.  The changes spread over the forefoot and all 4 toes are now mummified.  He had previously followed with a vascular surgeon Dr. Suzan in [place] who attempted revascularization of the legs but was not able to pass the catheter through the femoral artery occlusions.  Dr. Suzan then referred Reginald [name] to Dr. Garnette Odor vascular surgeon at [facility].  Dr. Vira clinic note is available for review and documents multilevel arterial occlusive disease. Dr. Odor recommended femoral endarterectomy with femoral-popliteal bypass if cardiac clearance could be obtained. Dr. Odor was skeptical that Reginald [name] would have sufficient perfusion to heal a below the knee amputation and offered palliative above-knee amputation is another  consideration.  Reginald [name] was recently evaluated by Reginald [name] who surmised that he is at high cardiac risk for surgery.  Reginald [name] prescribed with Xarelto 2.5 mg, but Reginald [name] states that he is not taking the Xarelto.    Dr. Vira last note observed: CTA shows that the patient has severe multilevel PAD of the bilateral lower extremity. Patient would require right CFA endarterectomy with RLE angiogram and possible endovascular revascularization. However with severity of SFA and popliteal disease, this may not be possible and he may require a below-knee popliteal or tibial bypass   If patient is not revascularization candidate we can consider palliative AKA if wounds or pain worsen  Mr. Gaudin has another appointment with Dr. Odor scheduled this Friday.  Reginald [name] had a clinic appointment with Dr. Mary today and was noted to have diminished systolic blood pressure and O2 saturations of 85% on 5 L.  She noted some erythema on his forefoot and was concerned about cellulitis in the foot.  He was referred to the emergency department [facility].  Reginald [name] is accompanied by his wife.  The report that the foot does not look significantly different than it has at other times over the past 6 weeks.  They report that the foot will often swell and become a little more red when Reginald [name] has to walk.  At home he walks very little with a walker, but does bear weight on the heel.  They both endorse that the foot will look very similar intermittently over the past several weeks and then look more  at baseline the following day.  He has not any drainage associated with the foot.  They both endorse that the right anatomic leg is often warmer than the left leg.  Reginald [name] denies any recent fever chills or malaise.  He feels as if his breathing has improved since addition of CellCept but is aware that the CellCept will need to be held whenever he has an infection.  He also understands that at  some point his foot and some part of his leg will require amputation.  The foot is more painful today, but Reginald [name] reports that he has similar increased pain when he has to ambulate more for doctors appointments and the pain will often return to baseline the following day.   ALLERGIES:  Allergies Allergen Reactions   Naproxen Itching and Rash   Roxicet [Oxycodone-Acetaminophen ] Itching and Rash     MEDICATIONS:  No current facility-administered medications for this encounter.   Current Outpatient Medications  Medication Sig Dispense Refill   acetaminophen  (TYLENOL  EXTRA STRENGTH) 500 MG tablet Take 2 tablets (1,000 mg total) by mouth daily as needed for pain.     aspirin  (ECOTRIN) 81 MG tablet Take 1 tablet (81 mg total) by mouth daily.     atorvastatin  (LIPITOR) 40 MG tablet TAKE 1 TABLET DAILY 90 tablet 3   empagliflozin (JARDIANCE) 10 mg tablet Take 1 tablet (10 mg total) by mouth daily. 90 tablet 3   fexofenadine (ALLEGRA) 180 MG tablet Take 1 tablet (180 mg total) by mouth daily with lunch. 30 tablet 0   furosemide  (LASIX ) 40 MG tablet Take 1 tablet (40 mg total) by mouth daily. 90 tablet 3   gabapentin  (NEURONTIN ) 300 MG capsule TAKE 1 CAPSULE TWICE DAILY 180 capsule 1   gemfibrozil  (LOPID ) 600 MG tablet Take 1 tablet (600 mg total) by mouth in the morning. 180 tablet 3   ipratropium (ATROVENT ) 42 mcg (0.06 %) nasal spray 2 sprays into each nostril Three (3) times a day. 15 mL 3   losartan  (COZAAR ) 25 MG tablet Take 1 tablet (25 mg total) by mouth daily. 90 tablet 3   metFORMIN  (GLUCOPHAGE ) 1000 MG tablet Take 1 tablet (1,000 mg total) by mouth in the morning and 1 tablet (1,000 mg total) in the evening. Take with meals. 1 tablet in morning and half tablet in evening, take with meals. 180 tablet 3   metoPROLOL  succinate (TOPROL -XL) 25 MG 24 hr tablet Take 1 tablet (25 mg total) by mouth daily. 90 tablet 3   montelukast  (SINGULAIR ) 10 mg tablet Take 1 tablet (10 mg  total) by mouth daily with lunch. 90 tablet 3   mycophenolate (CELLCEPT) 500 mg tablet Take 2 tablets (1,000 mg total) by mouth two (2) times a day. 180 tablet 1   nystatin (MYCOSTATIN) 100,000 unit/mL suspension Take 5 mL (500,000 Units total) by mouth four (4) times a day. (Patient taking differently: Take 5 mL (500,000 Units total) by mouth daily as needed.) 60 mL 0   omeprazole (PRILOSEC) 40 MG capsule Take 1 capsule (40 mg total) by mouth Two (2) times a day (30 minutes before a meal). 180 capsule 3   predniSONE  (DELTASONE ) 5 MG tablet Take 1 tablet (5 mg total) by mouth daily. 90 tablet 1   rivaroxaban (XARELTO) 2.5 mg tablet Take 1 tablet (2.5 mg total) by mouth two (2) times a day. (Patient not taking: Reported on 01/02/2024) 180 tablet 3   sertraline (ZOLOFT) 50 MG tablet TAKE 1 TABLET DAILY 90  tablet 3   traMADol  (ULTRAM ) 50 mg tablet Take 1 tablet (50 mg total) by mouth every six (6) hours.     umeclidinium-vilanterol (ANORO ELLIPTA ) 62.5-25 mcg/actuation inhaler USE 1 INHALATION ORALLY    DAILY 14 each 3    MEDICAL HISTORY:  Past Medical History: Diagnosis Date   Anemia    Aneurysm of iliac artery    Carotid stenosis    Chronic allergic rhinitis    Chronic bacterial prostatitis    Chronic sciatica of right side    COPD (chronic obstructive pulmonary disease) (CMS-HCC)    Coronary artery disease    Diabetes mellitus (CMS-HCC)    Hyperlipidemia    Hypertension    Lumbar stenosis    Male hypogonadism    Neuropathy    Osteoarthritis    Oxygen dependent    Peripheral arterial occlusive disease    Peripheral vascular disease    Venous insufficiency of leg     SURGICAL HISTORY: Past Surgical History: Procedure Laterality Date   BACK SURGERY     Sept 14 and May 16 - lumar decompression x2 with plates and screws   BYPASS GRAFT     triple vessel   CATARACT EXTRACTION     LUMBAR SPINE SURGERY     hnp r microdiscectomy   NASAL SEPTUM  SURGERY     x2   PR FRAGMENT KIDNEY STONE/ ESWL Left 11/15/2022   Procedure: LITHOTRIPSY ON TRUCK, LEFT SIDE;  Surgeon: Florencia Curtistine Sharper, MD;  Location: OR [facility];  Service: Urology   ROTATOR CUFF REPAIR      SOCIAL HISTORY:    reports that he quit smoking about 39 years ago. His smoking use included cigarettes. He started smoking about 69 years ago. He has a 14.5 pack-year smoking history. He has been exposed to tobacco smoke. He has never used smokeless tobacco. He reports that he does not drink alcohol and does not use drugs.  FAMILY HISTORY: family history includes Aortic aneurysm in his father; Heart disease in his father and mother; Hypertension in his father and mother; Stroke in his mother.   REVIEW OF SYSTEMS:   Review of Systems  Constitutional:  Negative for appetite change, chills and fever.  Respiratory:  Positive for shortness of breath (Chronic shortness of breath on 5l NBP). Negative for cough.   Cardiovascular:  Negative for chest pain and leg swelling.  Musculoskeletal:  Positive for back pain and gait problem.  Neurological:        Neuropathy left foot        Objective:  Vital signs:  Vitals:   01/02/24 1538  BP:   Pulse: 92  Resp: 22  Temp:   SpO2: 92%    Body mass index is 26.3 kg/m.  Physical Exam Constitutional:      General: He is not in acute distress.    Appearance: He is not toxic-appearing.     Comments: Pleasant cooperative and well-informed about his health  HENT:     Head: Normocephalic.  Eyes:     Conjunctiva/sclera: Conjunctivae normal.     Pupils: Pupils are equal, round, and reactive to light.  Cardiovascular:     Rate and Rhythm: Regular rhythm.     Heart sounds: No murmur heard. Pulmonary:     Effort: Pulmonary effort is normal.     Comments: On 5 L nasal oxygen Abdominal:     General: There is no distension.     Palpations: Abdomen is soft.  Musculoskeletal:  Comments: The left leg has loss of normal hair  distribution from the mid anatomic leg it is cooler to the touch than the right leg.  Equine deformity of the foot with a small eschar over the fifth metatarsal tarsal joint and a reddened area over the PIP joint of the second toe with significant hammertoe deformities.  No palpable pulses  The right lower extremity does have a Doppler waterhammer signal in the popliteal artery.  There is also posterior tibial Doppler signal but no palpable pulse.  There is maintenance of normal hair distribution to within 6 cm of the ankle and maintained bulk within the right gastrocnemius muscle.  The right foot does have 1+ edema beginning at the level of the malleoli with some hyperemia of the midfoot and clearly demarcated mummification of the distal forefoot and toes.  There is no expressible purulence or wet gangrene.  Neurological:     Mental Status: He is oriented to person, place, and time.  Psychiatric:        Mood and Affect: Mood normal.        Behavior: Behavior normal.        Thought Content: Thought content normal.      Recent Results (from the past 24 hours)  POCT glycosylated hemoglobin (Hb A1C)   Collection Time: 01/02/24  9:14 AM  Result Value Ref Range   Hemoglobin A1C 6.6 (A) 4.0 - 6.0 %   A1C LOT NBR 183,095    A1C STRIP EXP 04/17/26   ECG 12 Lead   Collection Time: 01/02/24 10:39 AM  Result Value Ref Range   EKG Systolic BP  mmHg   EKG Diastolic BP  mmHg   EKG Ventricular Rate 80 BPM   EKG Atrial Rate 80 BPM   EKG P-R Interval 216 ms   EKG QRS Duration 160 ms   EKG Q-T Interval 422 ms   EKG QTC Calculation 486 ms   EKG Calculated P Axis 28 degrees   EKG Calculated R Axis -48 degrees   EKG Calculated T Axis -34 degrees   QTC Fredericia 464 ms  Troponin I   Collection Time: 01/02/24 11:00 AM  Result Value Ref Range   Troponin I 0.020 <=0.034 ng/mL  PT-INR   Collection Time: 01/02/24 11:00 AM  Result Value Ref Range   PT 14.5 11.4 - 15.0 sec   INR 1.11 0.84 - 1.19  PTT    Collection Time: 01/02/24 11:00 AM  Result Value Ref Range   APTT 31.3 23.1 - 35.3 sec  Lactate Sepsis   Collection Time: 01/02/24 11:00 AM  Result Value Ref Range   Lactate 3.0 (HH) 0.7 - 2.0 mmol/L  Comprehensive Metabolic Panel   Collection Time: 01/02/24 11:00 AM  Result Value Ref Range   Sodium 139 134 - 145 mmol/L   Potassium 5.1 (H) 3.6 - 5.0 mmol/L   Chloride 102 98 - 109 mmol/L   CO2 26.0 22.0 - 30.0 mmol/L   Anion Gap 11 4 - 12 mmol/L   BUN 21 9 - 21 mg/dL   Creatinine 9.39 (L) 9.27 - 1.35 mg/dL   BUN/Creatinine Ratio 35 (H) 6 - 20   eGFR CKD-EPI (2021) Male >90 >=60 mL/min/1.42m2   Glucose 106 70 - 120 mg/dL   Calcium  9.6 8.6 - 10.6 mg/dL   Albumin  3.8 3.5 - 5.0 g/dL   Total Protein 6.6 6.3 - 8.2 g/dL   Total Bilirubin 0.5 0.2 - 1.3 mg/dL   AST 26 17 -  59 U/L   ALT 19 <50 U/L   Alkaline Phosphatase 112 38 - 126 U/L  CBC w/ Differential   Collection Time: 01/02/24 11:00 AM  Result Value Ref Range   WBC 14.9 (H) 4.8 - 10.8 10*9/L   RBC 4.98 4.70 - 6.10 10*12/L   HGB 13.4 (L) 14.0 - 18.0 g/dL   HCT 53.9 57.9 - 47.9 %   MCV 92.4 80.0 - 97.0 fL   MCH 26.9 (L) 27.0 - 33.0 pg   MCHC 29.1 (L) 30.0 - 35.0 g/dL   RDW 84.8 %   MPV 8.1 (L) 9.4 - 12.4 fL   Platelet 372 100 - 450 10*9/L   Neutrophils % 88.0 %   Immature Granulocytes Relative Percent 0.7 %   Lymphocytes % 4.2 %   Monocytes % 6.0 %   Eosinophils % 0.6 %   Basophils % 0.5 %   Absolute Neutrophils 13.2 (H) 1.8 - 7.8 10*9/L   Immature Granulocytes Absolute Count 0.1 0.0 - 0.1 10*9/L   Absolute Lymphocytes 0.6 (L) 1.0 - 4.8 10*9/L   Absolute Monocytes 0.9 (H) 0.0 - 0.8 10*9/L   Absolute Eosinophils 0.1 0.0 - 0.5 10*9/L   Absolute Basophils 0.1 0.0 - 0.2 10*9/L  C-reactive protein   Collection Time: 01/02/24 11:00 AM  Result Value Ref Range   CRP 27.1 (H) <=9.9 mg/L  Procalcitonin   Collection Time: 01/02/24 11:00 AM  Result Value Ref Range   Procalcitonin 0.06 See comment ng/mL  Lactate Sepsis    Collection Time: 01/02/24  1:06 PM  Result Value Ref Range   Lactate 1.9 0.7 - 2.0 mmol/L  Urinalysis with Microscopy (Clean Catch)   Collection Time: 01/02/24  3:34 PM  Result Value Ref Range   Color, UA Yellow Straw, Yellow, Light Yellow, Colorless   Clarity, UA Clear Clear   Specific Gravity, UA 1.038 (H) 1.016 - 1.022   pH, UA 7.0 5.0 - 8.5   Leukocyte Esterase, UA Negative Negative   Nitrite, UA Negative Negative   Protein, UA Negative Negative   Glucose, UA 4+ (A) Negative   Ketones, UA Negative Negative   Urobilinogen, UA 0.2 mg/dL <7.9 mg/dL, 0.2 mg/dL   Bilirubin, UA Negative Negative   Blood, UA Negative Negative    ECG 12 Lead Sinus rhythm with 1st degree AV block Left axis deviation Right bundle branch block Possible Lateral infarct (cited on or before 17-Sep-2022) Inferior infarct (cited on or before 16-Nov-2021) Abnormal ECG When compared with ECG of 10-Apr-2023 11:03, premature atrial complexes are no longer present Questionable change in initial forces of Lateral leads Confirmed by Valma Eck [phone]) on 01/02/2024 1:26:55 PM XR Chest Portable Narrative: TECHNIQUE: Portable AP chest radiograph  COMPARISON: Chest radiograph 03/26/2023, chest CT 08/18/2023  FINDINGS: Mild central vascular congestion. Unchanged reticular opacities along the lateral aspects of bilateral bilateral lungs. No acute airspace disease. No pneumothorax or pleural effusion. Unchanged cardiomegaly. Aortic calcifications. Postsurgical changes prior sternotomy. Osseous structures are intact. Impression: Unchanged subpleural reticular opacities throughout bilateral lungs most consistent with scarring/fibrosis. No acute airspace disease.  Signed (Electronic Signature): 01/02/2024 12:19 PM  Signed By: Lonni Mas XR Foot 3 Or More Views Right Narrative: TECHNIQUE: 3 radiographs of the right foot  COMPARISON: Right foot radiographs 07/04/2023  FINDINGS: No evidence for acute fracture  or dislocation. Scattered mild to moderate interphalangeal joint space narrowing. No evidence for periosteal reaction or osseous erosion. Fragmented osteophyte along the dorsal aspect of the navicular. Small posterior plantar calcaneal enthesophytes skin  ulceration along the medial aspect of the forefoot. Dense vascular calcifications. No dissecting soft tissue gas or radiopaque foreign body. Impression: No acute osseous abnormality of the right foot. No radiographic evidence to suggest active osteomyelitis.  Signed (Electronic Signature): 01/02/2024 12:18 PM  Signed By: Lonni Mas     ASSESSMENT and RECOMMENDATIONS: Mr. Issa is extremely medically complex and high risk for any surgical intervention.  He has chronic systolic heart failure with a ejection fraction of 30% and Reginald [name] had actually discussed placement of an ICD device.  He also has interstitial pulmonary fibrosis with baseline 5 to 6 L of supplemental oxygen.  He has advanced peripheral vascular disease with mummification of his right forefoot and toes.  He has under the care of a vascular surgeon, Dr. Evelina, at [facility] who has considered attempt revascularization if only to improve chances of healing of below the knee amputation.   Mr. Cato has experienced some increased pain in the right foot although he and his wife state that the foot frequently has a similar appearance.  He does have a leukocytosis of 14.9 and elevated CRP although procalcitonin is normal.  Initial lactate was 3 but reduced to 1.9 with treatment.  No other source of infection has been identified; therefore, I endorsed treating for cellulitis of the right foot  Given Mr. Hackworth's medical complexity and the care established with Dr. Evelina, transfer to [facility] vascular service was recommended and Mr. Goldfarb was accepted by the vascular surgeon on-call.  However, Mr. Soules has deferred transfer and requested that he be initially treated at [facility].  I have had a long and frank discussion with Mr. Tomeo and his wife regarding the possibility of need for emergent amputation in the very high possibility that healing but it could be challenging without revascularization.  Both Mr. and Mrs. Prowse verbalized a clear understanding but politely refused to consent for transfer.  Mr. Harjo has been admitted to the hospitalist service.  The margins of hyperemia have been marked on the forefoot.  Mr. Kolbe and his wife understand that if he develops high fevers, begins to have mental status changes, develops worsening pain or progressive erythema or lymphangitis then urgent amputation will need to be performed without the possibility of preprocedural vascular intervention.  I would recommend holding the CellCept until all infection have been resolved. I endorsed the initiation of vancomycin  and Zosyn for broad-spectrum antibiotic coverage.   Blood cultures have been obtained     Debby EMERSON Charletta Mickey MD [facility] General Surgery [facility] [phone] With extremely complex consultation and 90 minutes were spent reviewing outpatient visits and recommendations, discussing clinical presentation and options with Mr. Mrs. Monterrosa, performing examination and discussing the case with colleagues.  This note has been created using Conservation officer, historic buildings.  The note has been reviewed for accuracy, however errors may not always be identified.  Such creation errors do not reflect on the standard of medical care rendered to this patient, please contact me with any questions or need for clarification.

## 2024-01-03 NOTE — Consults (Signed)
 ------------------------------------------------------------------------------- Summary: Nutrition Asssessment -------------------------------------------------------------------------------   [facility]- INPATIENT NUTRITION ASSESSMENT & PLAN     Patient:Reginald [name] Age: 88 y.o.  Date of Birth: 15-Jun-1935   Active Orders  Diet   Nutrition Therapy Consistent Carb; Consistent Carb 45/45/45 (3/3/3)     Sex: male  Med Rec #: [id number]   Oral Nutrition Supplement Order: Boost Glucose Control bid   Room #: 307/307-01  PCP: Martine Andrez Moats, MD   Attending Provider: Hosey Marsa Heinz, MD  LOS: 1  Admission Date: 01/02/2024    NUTRITION RECOMMENDATIONS/ INTERVENTIONS SUMMARY Provide patient with boost glucose control bid to assist in overall nutrition.   Chief Complaint  Patient presents with   Foot Pain    Sent from PCP, necrotic tissue noted to right foot. Reports pcp sent to the ED for increased pain and redness from baseline    1. Cellulitis of right foot   2. Right foot pain   3. Leukocytosis, unspecified type   4. Hypotension, unspecified hypotension type   5. History of interstitial lung disease   6. Supplemental oxygen dependent   7. History of CHF (congestive heart failure)   8. History of diabetes mellitus   9. History of peripheral arterial disease   10. History of CAD (coronary artery disease)   11. History of hypertension       Anthropometrics  Ht Readings from Last 1 Encounters:  01/02/24 172.7 cm (5' 8)   Wt Readings from Last 1 Encounters:  01/02/24 78.5 kg (173 lb)   Wt Readings from Last 6 Encounters:  01/02/24 78.5 kg (173 lb)  01/02/24 78 kg (172 lb)  11/15/23 79.8 kg (176 lb)  11/02/23 78 kg (172 lb)  09/19/23 78.5 kg (173 lb)  09/04/23 80 kg (176 lb 5.9 oz)      History  Past Medical History[1] Past Surgical History[2] Short Social History[3] Food Allergies: None Known    Lab Results  Component Value Date    GLU 82 01/03/2024   GLU 106 01/02/2024   GLU 113 11/07/2023   BUN 19 01/03/2024   BCR 27 (H) 01/03/2024   NA 139 01/03/2024   K 4.1 01/03/2024   MG 1.7 (L) 01/03/2024   CHOL 102 06/27/2022   HDL 36 06/27/2022   LDL 48 06/27/2022   CALCIUM  8.6 01/03/2024   CRP 27.1 (H) 01/02/2024     NUTRITION ASSESSMENT/ PROBLEMS  Weight Change: Weight Change Past 6 Months in Pounds (rounded): 0 at 01/03/2024 12:42 PM Body Mass Index: Body mass index is 26.3 kg/m.  25-29.9 kg/m2 Overweight Braden Score: Braden Scale Sensory Perceptions: No impairment Moisture: Occasionally moist Activity: Walks occasionally Mobility: Slightly limited Nutrition: Adequate Friction and Shear: No apparent problem Braden Scale Score: 19   Does not suggest increased risk of skin breakdown (score 19 or above) Wounds:  Patient Lines/Drains/Airways Status     Active Wounds     None            ASSESSMENT Patient is currently on a carb consistent diet and has one recent meal recorded in flow sheets at this time. Patient was admitted with cellulitis of the right lower extremity according to diagnosis in patients chart. Patient will be working with room service on menu preferences and meal planning.   GOALS Patients PO intake to be 50% or greater. Patient to have no skin break down and no significant weight changes.   INTERVENTIONS/ RECOMMENDATIONS Provide patient with boost glucose control bid to assist in overall  nutrition.   MONITORING AND EVALUATION Continue to monitor patients PO intake and nutrition needs.  Hadassah JAYSON Hoit , CDM    Nutrition Therapy Assistant       [1] Past Medical History: Diagnosis Date   Anemia    Aneurysm of iliac artery    Carotid stenosis    Chronic allergic rhinitis    Chronic bacterial prostatitis    Chronic sciatica of right side    COPD (chronic obstructive pulmonary disease) (CMS-HCC)    Coronary artery disease    Diabetes mellitus (CMS-HCC)     Hyperlipidemia    Hypertension    Lumbar stenosis    Male hypogonadism    Neuropathy    Osteoarthritis    Oxygen dependent    Peripheral arterial occlusive disease    Peripheral vascular disease    Venous insufficiency of leg   [2] Past Surgical History: Procedure Laterality Date   BACK SURGERY     Sept 14 and May 16 - lumar decompression x2 with plates and screws   BYPASS GRAFT     triple vessel   CATARACT EXTRACTION     LUMBAR SPINE SURGERY     hnp r microdiscectomy   NASAL SEPTUM SURGERY     x2   PR FRAGMENT KIDNEY STONE/ ESWL Left 11/15/2022   Procedure: LITHOTRIPSY ON TRUCK, LEFT SIDE;  Surgeon: Florencia Curtistine Sharper, MD;  Location: [facility];  Service: Urology   ROTATOR CUFF REPAIR    [3] Social History Tobacco Use   Smoking status: Former    Current packs/day: 0.00    Average packs/day: 0.5 packs/day for 29.0 years (14.5 ttl pk-yrs)    Types: Cigarettes    Start date: 46    Quit date: 74    Years since quitting: 39.9    Passive exposure: Past   Smokeless tobacco: Never  Vaping Use   Vaping status: Never Used  Substance Use Topics   Alcohol use: Never   Drug use: Never

## 2024-01-04 NOTE — Discharge Summary (Signed)
 ------------------------------------------------------------------------------- Attestation signed by Claudene Worth Charles, MD at 01/04/24 1425 Discharge Attestation: I saw and evaluated the patient, participating in the key portions of the service on the day of discharge.  I reviewed the resident's note and agree with the discharge plans and disposition. I personally spent 45 minutes in discharge planning services.    Discharged AMA, see ACP note for further details. Considering risks/benefits of PO abx, sending on doxycycline as I think it has less chance to cause harm compared to fluoroquinolone or Bactrim  given patient's age. However, I do not think PO abx will be adequate to treat infection. Patient will follow up with vascular surgery tomorrow.    Worth DELENA Claudene, MD  -------------------------------------------------------------------------------  [facility] Medicine Teaching Service Physician Discharge Summary  Identifying Information:  Reginald [name] [birth year] [id number]  Primary Care Physician: Reginald Andrez Moats, MD   Code Status: Full Code  Admit Date: 01/02/2024  Discharge Date: 01/04/2024   Discharge To: Against Medical Advice  Discharge Service: [facility] - Hospitalist Teaching Service   Discharge Attending Physician: Worth Charles Claudene, MD  Discharge Diagnoses: Principal Problem:   Cellulitis of right lower extremity  Outpatient Provider Follow Up Issues at Transition Visit:  PAD - ensure follow up with vascular surgery ASAP, confirm Xarelto prescription Cellulitis - ensure completion of doxycycline, re-escalate and re-admit as needed  Hospital Course:  [name] is a 88 year old male with a significant medical history including severe peripheral arterial disease, coronary artery disease status post CABG, chronic systolic heart failure with an ejection fraction of approximately 25-30%, and interstitial lung disease requiring 5 L/min supplemental oxygen at baseline. He was  admitted for necrosis of the right distal foot with suspected cellulitis, requiring intravenous antibiotics.  CTA imaging from 06/2023 demonstrated severe bilateral lower extremity disease, and PVL/ABI studies obtained this admission showed no appreciable flow in the right posterior tibial or bilateral dorsalis pedis arteries, consistent with known occlusion. Multiple providers throughout this admission strongly recommended transfer for vascular surgery evaluation; however, the patient repeatedly and adamantly declined transfer both in the emergency department and during hospitalization. He demonstrated understanding of and accepted the risks of declining further intervention, including worsening ischemia, infection, limb loss, sepsis, and death.  He was treated empirically for right lower extremity cellulitis with IV vancomycin  and piperacillin-tazobactam. Workup showed no evidence of osteomyelitis on right foot & blood cultures remained negative. Despite counseling regarding the need for ongoing IV antibiotics and surgical intervention, the patient declined transfer and further inpatient management.  The patient had reported hypotension at his PCPs office prior to admission but remained normotensive during hospitalization after a single fluid bolus. Home antihypertensives and diuretics were held given blood pressure trends and underlying heart failure. His chronic HFrEF, interstitial lung disease, diabetes, and other comorbid conditions were managed conservatively during admission without acute decompensation.   After multiple discussions with the care team regarding his condition, recommended interventions, and potential risks, the patient elected to leave the hospital against medical advice. Risks of leaving AMA, including progression of infection and ischemia, limb loss, sepsis, and death, were clearly explained and acknowledged by the patient. With this in mind, he will be leaving against medical  advice. He confirms he would like to remain full code.  Procedures: No admission procedures for hospital encounter. ______________________________________________________________________ Discharge Medications:   Your Medication List     PAUSE taking these medications    mycophenolate 500 mg tablet Wait to take this until your doctor or other care provider tells you to  start again. Commonly known as: CELLCEPT Take 2 tablets (1,000 mg total) by mouth two (2) times a day.       START taking these medications    doxycycline 100 MG capsule Commonly known as: VIBRAMYCIN Take 1 capsule (100 mg total) by mouth two (2) times a day for 10 days.       CHANGE how you take these medications    gabapentin  300 MG capsule Commonly known as: NEURONTIN  Take 1 capsule (300 mg total) by mouth two (2) times a day as needed. Noon and HS daily What changed:  when to take this reasons to take this additional instructions       CONTINUE taking these medications    ANORO ELLIPTA  62.5-25 mcg/actuation inhaler Generic drug: umeclidinium-vilanterol USE 1 INHALATION ORALLY    DAILY   aspirin  81 MG tablet Commonly known as: ECOTRIN Take 1 tablet (81 mg total) by mouth daily.   atorvastatin  40 MG tablet Commonly known as: LIPITOR TAKE 1 TABLET DAILY   empagliflozin 10 mg tablet Commonly known as: JARDIANCE Take 1 tablet (10 mg total) by mouth daily.   fexofenadine 180 MG tablet Commonly known as: ALLEGRA Take 1 tablet (180 mg total) by mouth daily with lunch.   furosemide  40 MG tablet Commonly known as: LASIX  Take 1 tablet (40 mg total) by mouth daily.   gemfibrozil  600 MG tablet Commonly known as: LOPID  Take 1 tablet (600 mg total) by mouth in the morning.   HYDROcodone -acetaminophen  5-325 mg per tablet Commonly known as: NORCO Take 1 tablet by mouth once.   ipratropium 42 mcg (0.06 %) nasal spray Commonly known as: ATROVENT  2 sprays into each nostril Three (3) times a  day.   losartan  25 MG tablet Commonly known as: COZAAR  Take 1 tablet (25 mg total) by mouth daily.   metFORMIN  1000 MG tablet Commonly known as: GLUCOPHAGE  Take 1 tablet (1,000 mg total) by mouth in the morning.   metFORMIN  1000 MG tablet Commonly known as: GLUCOPHAGE  Take 0.5 tablets (500 mg total) by mouth nightly.   metoPROLOL  succinate 25 MG 24 hr tablet Commonly known as: Toprol -XL Take 1 tablet (25 mg total) by mouth daily.   montelukast  10 mg tablet Commonly known as: SINGULAIR  Take 1 tablet (10 mg total) by mouth daily with lunch.   omeprazole 40 MG capsule Commonly known as: PriLOSEC Take 1 capsule (40 mg total) by mouth Two (2) times a day (30 minutes before a meal).   predniSONE  5 MG tablet Commonly known as: DELTASONE  Take 1 tablet (5 mg total) by mouth daily.   sertraline 50 MG tablet Commonly known as: ZOLOFT TAKE 1 TABLET DAILY   traMADol  50 mg tablet Commonly known as: ULTRAM  Take 1 tablet (50 mg total) by mouth every six (6) hours.   TYLENOL  EXTRA STRENGTH 500 MG tablet Generic drug: acetaminophen  Take 2 tablets (1,000 mg total) by mouth daily as needed for pain.       ASK your doctor about these medications    rivaroxaban 2.5 mg tablet Commonly known as: XARELTO Take 1 tablet (2.5 mg total) by mouth two (2) times a day.      _____________________________________________________________________ Pending Test Results (if blank, then none): Pending Labs     Order Current Status   Blood Culture #1 Preliminary result   Blood Culture #2 Preliminary result     Most Recent Labs: Recent Results (from the past 24 hours)  POCT Glucose   Collection Time: 01/03/24  5:16 PM  Result Value Ref Range   Glucose, POC 137 (H) 70 - 120 mg/dL  POCT Glucose   Collection Time: 01/03/24  8:02 PM  Result Value Ref Range   Glucose, POC 259 (H) 70 - 120 mg/dL  POCT Glucose   Collection Time: 01/04/24  7:13 AM  Result Value Ref Range   Glucose, POC 92 70  - 120 mg/dL  Magnesium Level   Collection Time: 01/04/24  7:37 AM  Result Value Ref Range   Magnesium 1.7 (L) 1.9 - 2.4 mg/dL  Comprehensive Metabolic Panel   Collection Time: 01/04/24  7:37 AM  Result Value Ref Range   Sodium 139 134 - 145 mmol/L   Potassium 4.4 3.6 - 5.0 mmol/L   Chloride 106 98 - 109 mmol/L   CO2 28.0 22.0 - 30.0 mmol/L   Anion Gap 5 4 - 12 mmol/L   BUN 15 9 - 21 mg/dL   Creatinine 9.49 (L) 9.27 - 1.35 mg/dL   BUN/Creatinine Ratio 30 (H) 6 - 20   eGFR CKD-EPI (2021) Male >90 >=60 mL/min/1.57m2   Glucose 95 70 - 120 mg/dL   Calcium  8.5 (L) 8.6 - 10.6 mg/dL   Albumin  2.9 (L) 3.5 - 5.0 g/dL   Total Protein 5.4 (L) 6.3 - 8.2 g/dL   Total Bilirubin 0.4 0.2 - 1.3 mg/dL   AST 20 17 - 59 U/L   ALT 13 <50 U/L   Alkaline Phosphatase 85 38 - 126 U/L  CBC w/ Differential   Collection Time: 01/04/24  7:37 AM  Result Value Ref Range   WBC 11.4 (H) 4.8 - 10.8 10*9/L   RBC 4.06 (L) 4.70 - 6.10 10*12/L   HGB 10.8 (L) 14.0 - 18.0 g/dL   HCT 63.4 (L) 57.9 - 47.9 %   MCV 89.9 80.0 - 97.0 fL   MCH 26.6 (L) 27.0 - 33.0 pg   MCHC 29.6 (L) 30.0 - 35.0 g/dL   RDW 84.6 %   MPV 8.1 (L) 9.4 - 12.4 fL   Platelet 263 100 - 450 10*9/L   Neutrophils % 80.7 %   Immature Granulocytes Relative Percent 0.4 %   Lymphocytes % 7.3 %   Monocytes % 9.3 %   Eosinophils % 1.8 %   Basophils % 0.5 %   Absolute Neutrophils 9.2 (H) 1.8 - 7.8 10*9/L   Immature Granulocytes Absolute Count 0.1 0.0 - 0.1 10*9/L   Absolute Lymphocytes 0.8 (L) 1.0 - 4.8 10*9/L   Absolute Monocytes 1.1 (H) 0.0 - 0.8 10*9/L   Absolute Eosinophils 0.2 0.0 - 0.5 10*9/L   Absolute Basophils 0.1 0.0 - 0.2 10*9/L  POCT Glucose   Collection Time: 01/04/24 11:20 AM  Result Value Ref Range   Glucose, POC 129 (H) 70 - 120 mg/dL  Vancomycin , Trough   Collection Time: 01/04/24 11:54 AM  Result Value Ref Range   Vancomycin  Tr 10.8 5.0 - 20.0 ug/mL     Relevant Studies/Radiology (if blank, then none): PVL ABI Result  Date: 01/03/2024 STUDY: Ankle brachial index   TECHNIQUE: Bilateral brachial, posterior tibial, and dorsalis pedis systolic pressures were measured. Doppler waveforms of the bilateral dorsalis pedis and posterior tibial arteries were obtained.   INDICATION: Right foot gangrene   COMPARISON: CTA runoff 07/04/2023   FINDINGS:   RIGHT: Brachial 111 mmHg Unable to obtain blood flow within the right posterior tibial and dorsalis pedis arteries.   LEFT: Brachial unable to be obtained due to left upper extremity IV. Unable to obtain blood flow  within the left dorsalis pedis artery. Limited flow seen within the left posterior tibial artery which is noncompressible.   Abnormal waveforms bilaterally.     No appreciable flow corresponding to the right posterior tibial or bilateral dorsalis pedis arteries consistent with known occlusion/limited flow as seen on prior runoff. Correlation with prior CTA runoff report recommended.   Noncompressibility of the left dorsalis pedis artery consistent with dense peripheral vascular disease as seen on prior runoff.   Signed (Electronic Signature): 01/03/2024 3:36 PM Signed By: Lonni Mas  ECG 12 Lead Result Date: 01/02/2024 Sinus rhythm with 1st degree AV block Left axis deviation Right bundle branch block Possible Lateral infarct (cited on or before 17-Sep-2022) Inferior infarct (cited on or before 16-Nov-2021) Abnormal ECG When compared with ECG of 10-Apr-2023 11:03, premature atrial complexes are no longer present Questionable change in initial forces of Lateral leads Confirmed by Valma Eck [phone]) on 01/02/2024 1:26:55 PM  XR Chest Portable Result Date: 01/02/2024 TECHNIQUE: Portable AP chest radiograph   COMPARISON: Chest radiograph 03/26/2023, chest CT 08/18/2023   FINDINGS: Mild central vascular congestion. Unchanged reticular opacities along the lateral aspects of bilateral bilateral lungs. No acute airspace disease. No pneumothorax or  pleural effusion. Unchanged cardiomegaly. Aortic calcifications. Postsurgical changes prior sternotomy. Osseous structures are intact.     Unchanged subpleural reticular opacities throughout bilateral lungs most consistent with scarring/fibrosis. No acute airspace disease.   Signed (Electronic Signature): 01/02/2024 12:19 PM Signed By: Lonni Mas  XR Foot 3 Or More Views Right Result Date: 01/02/2024 TECHNIQUE: 3 radiographs of the right foot   COMPARISON: Right foot radiographs 07/04/2023   FINDINGS: No evidence for acute fracture or dislocation. Scattered mild to moderate interphalangeal joint space narrowing. No evidence for periosteal reaction or osseous erosion. Fragmented osteophyte along the dorsal aspect of the navicular. Small posterior plantar calcaneal enthesophytes skin ulceration along the medial aspect of the forefoot. Dense vascular calcifications. No dissecting soft tissue gas or radiopaque foreign body.     No acute osseous abnormality of the right foot. No radiographic evidence to suggest active osteomyelitis.   Signed (Electronic Signature): 01/02/2024 12:18 PM Signed By: Lonni Mas   ______________________________________________________________________ Discharge Instructions:  Activity Instructions     Activity as tolerated         Diet Instructions     Discharge diet (specify)     Discharge Nutrition Therapy: Regular       Other Instructions     Discharge instructions to patient: Call your Specialist doctor and make an appointment to see them (specify):     Specialist: Vascular surgery Within 3 days from the time you are discharged from the hospital   Discharge instructions to patient: Call your primary care doctor and make an appointment to see them:     Within 2 weeks from the time you are discharged from the hospital   No dressing needed        Appointments which have been scheduled for you    Feb 14, 2024 3:40 PM (Arrive by  3:25 PM) 6 MINUTE WALK TEST with ARM PULM PROCEDURE ROOM ARM PULMONOLOGY [facility] [place] [place]) [address] [place] [facility] [address] [phone]     Feb 14, 2024 4:00 PM (Arrive by 3:45 PM) RETURN PULMONARY with Irving Elsie Dredge, MD [facility] PULMONOLOGY [facility] [place]) [address] [place] [facility] [address] [phone]     Feb 27, 2024 2:00 PM (Arrive by 1:45 PM) RETURN COMPLEX with Tawni Brooks, FNP [facility]  BOONE [facility]) [address] [place] [address] [phone]     Mar 04, 2024 1:00 PM (Arrive by 12:45 PM) RETURN RHEUMATOLOGY with Toribio Belvie Copas, DO ARM RHEUMATOLOGY [facility] [place]) [address] [place] [address] [phone]     Jun 14, 2024 9:45 AM (Arrive by 9:30 AM) LAB ONLY with ARM UROLOGY BO[facility] LAB ONLY ARM UROLOGY [address] [facility]) [address] [place] [address] [phone]     Jun 14, 2024 10:00 AM (Arrive by 9:45 AM) ULTRASOUND with ARM UROLOGY BO[facility] ULTRASOUND ARM UROLOGY [address] [facility]ONE [facility] [place]) [address] [place] [address] [phone]     Jun 14, 2024 10:30 AM (Arrive by 10:15 AM) RETURN UROLOGY with Curtistine Ozell Artist, MD [facility] UROLOGY [address] BOONE [facility] [place]) [address] [place] [address] [phone]        The patient was given precautions when they should seek care sooner. All questions were answered and our contact information was provided if further concerns arise.  _____________________________________________________________________ Discharge Day Services: BP 104/68   Pulse 82   Temp 36.7 C (98.1 F) (Oral)   Resp 17   Ht 172.7 cm (5' 8)   Wt 78.5 kg (173 lb)   SpO2 95%   BMI 26.30 kg/m   Patient seen on day of discharge and deemed appropriate for discharge. Seem same-day progress note for physical  exam.  Condition at Discharge: poor  The discharge plan was discussed with our attending physician who will make changes as needed in their addendum.   Katherine H. Justo, MD Family Medicine PGY-3 01/04/2024, 2:19 PM

## 2024-01-05 NOTE — Progress Notes (Signed)
 " VASCULAR SURGERY CLINIC NOTE Patient PCP: No primary care provider on file.  Assessment and Plan 88 year old male with history of CAD s/p three-vessel CABG, heart failure with a 25 to 30% ejection fraction and interstitial lung disease on oxygen at home who presented for evaluation of PAD with CLTI and dry gangrene of his right foot.  CTA shows that the patient has severe multilevel PAD of the bilateral lower extremity.  Patient would require right CFA endarterectomy with RLE angiogram and possible endovascular revascularization.  However with severity of SFA and popliteal disease, this may not be possible and he may require a tibial bypass.  In the setting of CAD, CHF with reduced ejection fraction, lung disease on home oxygen and overall physical condition, he is not a great candidate for invasive procedure.    The patient was seen by cardiology for risk assessment.  As a 30% LVEF with severe pretension.  He would be at very high risk for vascular surgery.  This would require ICD placement cardiac catheterization.  We therefore discussed palliative management with Betadine paint to the toes versus knee amputation if the symptoms worsen or become sick from the wounds.  The patient will return to clinic next month for further assessment and planning.   Vascular Problem List Overview  *PAD with CLTI and gangrene of right foot - Multilevel infrainguinal disease bilaterally - The patient would likely be high risk procedure to include femoral endarterectomy and distal tibial bypass.  He would first need an RLE angiogram for better assessment of tibial target. The angiogram will be very high risk due to severe calcification of his left CFA. - Cardiac risk assessment shows severe CHF with severe pulmonary hypertension.  He would require ICD placement and coronary cath prior to revascularization. - Recommend continuing elevated management with Betadine paint and offloading - Continue aggressive wound care  with Betadine paint and offloading. - Consider definitive AKA for symptoms management and will discuss at next visit - Cont ASA, Xarelto, statin  Follow Up 1 month follow-up to discuss possible AKA  Updates/subjective The patient continues to have rest pain in his right foot.  No nausea, vomiting, fevers, chills or symptoms of infection.  His right foot wound remains dry.  He recently saw cardiology for further cardiac work up and risk assessment.  He is found to be at very high risk for any vascular intervention. Physical Exam General: Frail appearing male in wheelchair Pulmonary: Comfortable breathing on oxygen Cardiac: Regular rate Vascular: Monophasic bilateral PT and DP signals Extremities: Rubor in right foot with mild swelling.  Dry gangrene of right 1st and 2nd digits.  Dry gangrene of lateral right foot.  Dry gangrene of right heel.  No evidence of gross infection or bone exposure.  Vitals:   01/05/24 1137  BP: (!) 96/55  Pulse: 84   Vascular Studies  ABI/TBI: 01/03/2024 RIGHT:  Brachial 111 mmHg  Unable to obtain blood flow within the right posterior tibial and dorsalis pedis arteries.   LEFT:  Brachial unable to be obtained due to left upper extremity IV.  Unable to obtain blood flow within the left dorsalis pedis artery. Limited flow seen within the left posterior tibial artery which is noncompressible.  Lets see you can sure at all this past notes CTA: 07/04/2023 1.  There is severe peripheral vascular disease.  2.  There is occlusion of the distal right SFA with minimal detectable flow only seen on the delayed phase beyond the level of the popliteal artery.  Collaterals are noted arising from the profunda femoris. No detectable flow is seen at the foot. Chronicity is uncertain.  3.  There is occlusion of the left proximal SFA. Weak collaterals are noted arising from the profunda. There is no detectable flow seen below the knee or to the foot. Chronicity is uncertain.   4.  Incidental findings are characterized above.   Vascular study images were independently reviewed by me and results discussed with the patient.  Greater than 60 minutes were spent evaluating the patient's complex history, imaging, and discussing findings and patient's developing plan.  Garnette Odor, MD Vascular Surgery "

## 2024-01-28 ENCOUNTER — Encounter (HOSPITAL_COMMUNITY): Payer: Self-pay

## 2024-01-28 NOTE — ED Notes (Addendum)
 1908- Pt is declined, for now at [facility]. Reached out to [facility] in [place], [place].   8091- [facility]([phone]), Spoke with PALS agent Tammy. She is paging out the hospitalitis, and will call back.   1940- [facility] agent Tammy called back with Dr. Charlton on the line to speak with PA Estelle. Connected providers.Dr. Charlton hs accepted, waiting for vascular to accept Pt.   2110- Called [facility] with PALS agent Tammy, She stated we're waiting for a progressive bed, I let them know that it needs to be escalated. It could be tomorrow.  2357- Called and spoke with Placement Agent Tammy @ [facility], Per Pt's son's request. She has messaged the hospitalitis and several Pt's are awaiting beds in the progressive unit. She will call back when they give her a room and unit assignment.

## 2024-01-28 NOTE — ED Notes (Addendum)
@  1234 called [facility] with them regarding transfer for vascular: sepsis & gangrene of the right foot: checking with house supervisor to see if they have beds available and stated that they will call back and let me know.   @ 1300 [facility] back and stated that they are at capacity  @1825  Provider stated that Family and Friend were discussing transfer and where they wanted the patient to go: So I went to let them know how the process worked and that we have to start with the closest appropriate facility and go from there. If they choose the place than they would have to pay for the transport out of pocket. The friend in the room Duwaine stated that we can't say but they could sign him out AMA and drive him down themselves. I stated that I could not say that. Again, she stated that she knew too much and that she didn't need to talk anymore. I again explained that due to EMTALA it would be a violation and could lead to possible hospital fines if we didn't go to the closet appropriate facility first. They asked what I thought and I stated that I am Non Clinical but it's highly reccommended that he is transported with EMS due to his O2 Stat and oxygen requirement. The friend Duwaine  Stated I can't lie to you anymore I am the Vice President: Corporate Treasurer of this hospital: I am not here to get you in trouble I asked if they had anymore questions and left them to discuss what they would like to do for patient  @ 1853 Reche Bunker PA came to me and stated that the family and friend of Room 6 asked to speak to me and went to family room and to see what they might need. They wanted to let me know that they had decided that they wanted him to go to [place] and explain that we could do that and how that would work and what transfer looked like with EMS and again stated that it is highly recommended for him to stay and go by EMS. They were very understanding and very appreciative of all we  have done. Informed them I would be off shift but would pass along to nightshift.

## 2024-01-28 NOTE — Consults (Signed)
 Family Medicine Teaching Service Consultation History and Physical Note  PCP: Reginald Andrez Moats, MD Date of Admission: January 28, 2024.   Reginald [name].   DOB: 06/15/1935.   [id number] Code Status: full code Emergency Contact: Wife, Son   Consult requested by:ED, Reginald [name] Reason: second opinion and management recommendations  Foot Pain (Pt reports chronic necrotic foot since june due to DM and decreased circulation. Pt c/o increasing redness up to mid shin and worsening pain over the past week. Pt had appointment with his vascular surgeon tomorrow. Pt took tramadol  at 0500.)  HISTORY OF PRESENT ILLNESS: Reginald [name] is a 89 y.o. male who presents with recurrent right foot infection secondary to gangrenous tissue.  He is accompanied by his wife who reports that he has had spreading erythema and warmth of the right foot similar to previous presentation for cellulitis secondary to gangrene.  Patient has been followed extensively outpatient by vascular surgery and recent CTA of the abdomen and bilateral iliofemoral runoff on 07/04/2023 revealed severe peripheral vascular disease with occlusion of the distal right superficial femoral artery and only minimal detectable flow with significant collaterals and no detectable flow at the foot.  Is additionally, occlusion of the left proximal SFA is noted with weak collaterals and no detectable flow in the knee or to the foot.  Patient has a long smoking history and notable CAD with prior stents worsened by chronic hypertension, type 2 diabetes, and HFrEF.  Patient also has notable history of diabetic ulcer of the right foot  On presentation, patient's right foot is mummified and gangrenous with complete loss of sensation to all toes, please see media for relevant pictures.  He presented with his wife for evaluation of spreading erythema as above.  He was recently hospitalized and discharged on 12/18 for similar problem after receiving multiple days of  IV antibiotics.  During his previous hospitalization, transfer to facility with vascular surgery was repeatedly recommended and refused by patient and his wife.  Ultimately, patient discharged AMA after extensive counseling of risks and benefits.  Upon return to the emergency department, his wife requests that he be admitted here for repeat IV antibiotics.    PAST MEDICAL / SURGICAL HX: Past Medical History:  Diagnosis Date   Anemia    Aneurysm of iliac artery    Carotid stenosis    Chronic allergic rhinitis    Chronic bacterial prostatitis    Chronic sciatica of right side    COPD (chronic obstructive pulmonary disease) (CMS-HCC)    Coronary artery disease    Diabetes mellitus (CMS-HCC)    Hyperlipidemia    Hypertension    Lumbar stenosis    Male hypogonadism    Neuropathy    Osteoarthritis    Oxygen dependent    Peripheral arterial occlusive disease    Peripheral vascular disease    Venous insufficiency of leg      Past Surgical History[1]  FAMILY HX:  Family History[2]  SOCIAL HX:  Social History[3]  MEDICATIONS / ALLERGIES: Prescriptions Prior to Admission[4]  Allergies[5]  REVIEW OF SYSTEMS: Pertinent positives and negatives per HPI. A complete review of systems otherwise negative.  PHYSICAL EXAM: Recent Vitals: Vitals:   01/28/24 1801  BP: 136/78  Pulse:   Resp:   Temp:   SpO2: 97%    Body mass index is 26.76 kg/m.  Physical Exam Vitals and nursing note reviewed.  Constitutional:      General: He is not in acute distress.    Appearance: Normal appearance.  He is normal weight. He is not ill-appearing or toxic-appearing.  HENT:     Head: Normocephalic and atraumatic.     Mouth/Throat:     Mouth: Mucous membranes are moist.  Eyes:     Extraocular Movements: Extraocular movements intact.     Conjunctiva/sclera: Conjunctivae normal.     Pupils: Pupils are equal, round, and reactive to light.  Cardiovascular:     Rate and Rhythm:  Normal rate.  Pulmonary:     Effort: Pulmonary effort is normal. No respiratory distress.  Abdominal:     General: Abdomen is flat.  Skin:    Findings: No rash.     Comments: Significant gangrene noted to all toes of the R foot. Spreading erythema and warmth noted on the dorsal surface of the foot up to the ankle.   Neurological:     General: No focal deficit present.     Mental Status: He is alert and oriented to person, place, and time. Mental status is at baseline.  Psychiatric:        Mood and Affect: Mood normal.        Behavior: Behavior normal.     LABS/ STUDIES: Results for orders placed or performed during the hospital encounter of 01/28/24  Comprehensive Metabolic Panel  Result Value Ref Range   Sodium 136 134 - 145 mmol/L   Potassium 4.4 3.6 - 5.0 mmol/L   Chloride 99 98 - 109 mmol/L   CO2 27.0 22.0 - 30.0 mmol/L   Anion Gap 10 4 - 12 mmol/L   BUN 21 9 - 21 mg/dL   Creatinine 9.39 (L) 9.27 - 1.35 mg/dL   BUN/Creatinine Ratio 35 (H) 6 - 20   eGFR CKD-EPI (2021) Male >90 >=60 mL/min/1.47m2   Glucose 103 70 - 120 mg/dL   Calcium  9.1 8.6 - 10.6 mg/dL   Albumin  3.4 (L) 3.5 - 5.0 g/dL   Total Protein 6.2 (L) 6.3 - 8.2 g/dL   Total Bilirubin 0.5 0.2 - 1.3 mg/dL   AST 25 17 - 59 U/L   ALT 22 <50 U/L   Alkaline Phosphatase 110 38 - 126 U/L  Lactate Sepsis  Result Value Ref Range   Lactate 2.2 (HH) 0.7 - 2.0 mmol/L  C-reactive protein  Result Value Ref Range   CRP 152.4 (H) <=9.9 mg/L  Sedimentation rate, manual  Result Value Ref Range   Sed Rate 22 (H) 0 - 10 mm/h  Lactate Sepsis  Result Value Ref Range   Lactate 2.2 (HH) 0.7 - 2.0 mmol/L  Lactate Sepsis  Result Value Ref Range   Lactate 1.4 0.7 - 2.0 mmol/L  CBC w/ Differential  Result Value Ref Range   WBC 17.1 (H) 4.8 - 10.8 10*9/L   RBC 4.52 (L) 4.70 - 6.10 10*12/L   HGB 11.5 (L) 14.0 - 18.0 g/dL   HCT 60.6 (L) 57.9 - 47.9 %   MCV 86.9 80.0 - 97.0 fL   MCH 25.4 (L) 27.0 - 33.0 pg   MCHC 29.3 (L) 30.0  - 35.0 g/dL   RDW 83.5 %   MPV 8.7 (L) 9.4 - 12.4 fL   Platelet 276 100 - 450 10*9/L   Neutrophils % 90.1 %   Immature Granulocytes Relative Percent 0.6 %   Lymphocytes % 2.3 %   Monocytes % 6.7 %   Eosinophils % 0.1 %   Basophils % 0.2 %   Absolute Neutrophils 15.5 (H) 1.8 - 7.8 10*9/L   Immature Granulocytes Absolute Count 0.1  0.0 - 0.1 10*9/L   Absolute Lymphocytes 0.4 (L) 1.0 - 4.8 10*9/L   Absolute Monocytes 1.1 (H) 0.0 - 0.8 10*9/L   Absolute Eosinophils 0.0 0.0 - 0.5 10*9/L   Absolute Basophils 0.0 0.0 - 0.2 10*9/L  Radiology studies personally reviewed: yes. Findings: No PE, persistent pulmonary fibrosis   ASSESSMENT / PLAN:  89 y.o. male with a past medical history significant for peripheral artery disease, coronary artery disease, COPD, pulmonary fibrosis, heart failure, type II by diabetes mellitus who presents with acute infection of right gangrenous toes.  No problem-specific Assessment & Plan notes found for this encounter.  # Peripheral artery disease #Dry gangrene #Superficial cellulitis #Sepsis  I have had comprehensive and detailed discussions with patient, his wife, his son Garrel, and their family friend Duwaine about the overall prognosis of his condition and the severe risk presented by his near-complete occlusions from peripheral artery disease. It does seem that amputation of the gangrenous region has been discussed previously with the patient and his wife, but their report of vascular surgery discussions outpatient indicates that he is likely not a viable candidate for the revascularization procedures which would be necessary for amputation healing.  He has been told that these procedures would take somewhere between 6 and 7 hours and his cardiac history (HFrEF, CAD, etc) precludes surgeries of this length. As such, at this time, it is not clear what the ultimate solution to his gangrene infection would be given that he has significant risk of poor healing from  amputation as well as significant risk for revascularization to prevent poor healing.  Recent vascular surgery note indicates that we discussed palliative management with Betadine versus knee amputation  I believe that admission to this hospital would  not be appropriate for this patient given that he is at high risk for acute limb ischemia and in the event of such an outcome, patient would not have immediate access to vascular surgery for management.  Additionally, it is very unlikely that general surgery in this facility would be able to provide adequate treatment for his gangrenous toes in the setting of his poor circulation and subsequent poor healing.  They have attested as much in my conversation with them today and have made similar recommendations during his previous hospitalization for this same pathology. After discussion on the above information, patient's family does agree to transfer to a facility where vascular surgery is available.  I have informed the ED provider of this and they will begin the transfer process.  # Dispo: Transfer [ ]  Discussed plan with patient and family: yes [ ]  Anticipated Discharge Location: Transfer to another hospital [ ]  PT/OT/DME: Pending transfer [ ]  CM/SW needs: Pending Transfer [ ]  Teaching: Pending Transfer  I personally discussed the patient's condition with Glennon Bunker,  PA in the emergency department, and on-call general surgery.  Signe KANDICE Mt, MD,  January 28, 2024 6:31 PM       [1] Past Surgical History: Procedure Laterality Date   BACK SURGERY     Sept 14 and May 16 - lumar decompression x2 with plates and screws   BYPASS GRAFT     triple vessel   CATARACT EXTRACTION     LUMBAR SPINE SURGERY     hnp r microdiscectomy   NASAL SEPTUM SURGERY     x2   PR FRAGMENT KIDNEY STONE/ E[facility]L Left 11/15/2022   Procedure: LITHOTRIPSY ON TRUCK, LEFT SIDE;  Surgeon: Florencia Curtistine Sharper, MD;  Location: OR [facility];  Service:  Urology   ROTATOR CUFF REPAIR    [2] Family History Problem Relation Age of Onset   Heart disease Mother    Stroke Mother    Hypertension Mother    Heart disease Father    Aortic aneurysm Father    Hypertension Father   [3] Social History Socioeconomic History   Marital status: Married  Tobacco Use   Smoking status: Former    Current packs/day: 0.00    Average packs/day: 0.5 packs/day for 29.0 years (14.5 ttl pk-yrs)    Types: Cigarettes    Start date: 49    Quit date: 1986    Years since quitting: 40.0    Passive exposure: Past   Smokeless tobacco: Never  Vaping Use   Vaping status: Never Used  Substance and Sexual Activity   Alcohol use: Never   Drug use: Never   Sexual activity: Not Currently    Partners: Female   Social Drivers of Health   Food Insecurity: No Food Insecurity (01/02/2024)   Hunger Vital Sign    Worried About Running Out of Food in the Last Year: Never true    Ran Out of Food in the Last Year: Never true  Tobacco Use: Medium Risk (01/05/2024)   Received from [facility]   Patient History    Smoking Tobacco Use: Former    Smokeless Tobacco Use: Former    Passive Exposure: Past  Transportation Needs: No Transportation Needs (01/02/2024)   PRAPARE - Administrator, Civil Service (Medical): No    Lack of Transportation (Non-Medical): No  Alcohol Use: Not At Risk (01/02/2024)   Alcohol Use    How often do you have a drink containing alcohol?: Never    How many drinks containing alcohol do you have on a typical day when you are drinking?: 1 - 2    How often do you have 5 or more drinks on one occasion?: Never  Housing: Low Risk (01/02/2024)   Housing    Within the past 12 months, have you ever stayed: outside, in a car, in a tent, in an overnight shelter, or temporarily in someone else's home (i.e. couch-surfing)?: No    Are you worried about losing your housing?: No  Physical Activity: Insufficiently  Active (01/02/2024)   Exercise Vital Sign    Days of Exercise per Week: 3 days    Minutes of Exercise per Session: 30 min  Utilities: Low Risk (01/02/2024)   Utilities    Within the past 12 months, have you been unable to get utilities (heat, electricity) when it was really needed?: No  Stress: No Stress Concern Present (01/02/2024)   [employer] of Occupational Health - Occupational Stress Questionnaire    Feeling of Stress: Not at all  Interpersonal Safety: Not At Risk (01/02/2024)   Interpersonal Safety    Unsafe Where You Currently Live: No    Physically Hurt by Anyone: No    Abused by Anyone: No  Substance Use: Low Risk (01/02/2024)   Substance Use    In the past year, how often have you used prescription drugs for non-medical reasons?: Never    In the past year, how often have you used illegal drugs?: Never    In the past year, have you used any substance for non-medical reasons?: No  Social Connections: Moderately Integrated (01/02/2024)   Social Connection and Isolation Panel    Frequency of Communication with Friends and Family: Twice a week    Frequency of Social Gatherings with  Friends and Family: Twice a week    Attends Religious Services: More than 4 times per year    Active Member of [employer] or Organizations: No    Attends Banker Meetings: Never    Marital Status: Married  Programmer, Applications: Low Risk (01/02/2024)   Overall Financial Resource Strain (CARDIA)    Difficulty of Paying Living Expenses: Not hard at all  Health Literacy: Low Risk (01/02/2024)   Health Literacy    : Never  Internet Connectivity: No Internet connectivity concern identified (01/02/2024)   Internet Connectivity    Do you have access to internet services: Yes    How do you connect to the internet: Personal Device at home    Is your internet connection strong enough for you to watch video on your device without major problems?: Yes    Do you have  enough data to get through the month?: Yes    Does at least one of the devices have a camera that you can use for video chat?: Yes  [4] (Not in a hospital admission)  [5] Allergies Allergen Reactions   Naproxen Itching and Rash   Roxicet [Oxycodone-Acetaminophen ] Itching and Rash

## 2024-01-29 ENCOUNTER — Inpatient Hospital Stay (HOSPITAL_COMMUNITY)
Admission: RE | Admit: 2024-01-29 | Discharge: 2024-02-14 | DRG: 239 | Disposition: A | Discharge status: Rehabilitation Facility | Source: Other Acute Inpatient Hospital | Attending: Family Medicine | Admitting: Family Medicine

## 2024-01-29 ENCOUNTER — Encounter (HOSPITAL_COMMUNITY): Payer: Self-pay | Admitting: Internal Medicine

## 2024-01-29 DIAGNOSIS — I483 Typical atrial flutter: Secondary | ICD-10-CM | POA: Diagnosis not present

## 2024-01-29 DIAGNOSIS — I255 Ischemic cardiomyopathy: Secondary | ICD-10-CM | POA: Diagnosis present

## 2024-01-29 DIAGNOSIS — E873 Alkalosis: Secondary | ICD-10-CM | POA: Diagnosis not present

## 2024-01-29 DIAGNOSIS — I110 Hypertensive heart disease with heart failure: Secondary | ICD-10-CM | POA: Diagnosis present

## 2024-01-29 DIAGNOSIS — I5020 Unspecified systolic (congestive) heart failure: Secondary | ICD-10-CM | POA: Diagnosis present

## 2024-01-29 DIAGNOSIS — I5043 Acute on chronic combined systolic (congestive) and diastolic (congestive) heart failure: Secondary | ICD-10-CM | POA: Diagnosis not present

## 2024-01-29 DIAGNOSIS — J8410 Pulmonary fibrosis, unspecified: Secondary | ICD-10-CM | POA: Diagnosis present

## 2024-01-29 DIAGNOSIS — I25810 Atherosclerosis of coronary artery bypass graft(s) without angina pectoris: Secondary | ICD-10-CM | POA: Diagnosis not present

## 2024-01-29 DIAGNOSIS — Z7982 Long term (current) use of aspirin: Secondary | ICD-10-CM

## 2024-01-29 DIAGNOSIS — I70261 Atherosclerosis of native arteries of extremities with gangrene, right leg: Principal | ICD-10-CM | POA: Diagnosis present

## 2024-01-29 DIAGNOSIS — D72829 Elevated white blood cell count, unspecified: Secondary | ICD-10-CM | POA: Diagnosis not present

## 2024-01-29 DIAGNOSIS — Z7984 Long term (current) use of oral hypoglycemic drugs: Secondary | ICD-10-CM

## 2024-01-29 DIAGNOSIS — R4189 Other symptoms and signs involving cognitive functions and awareness: Secondary | ICD-10-CM | POA: Diagnosis not present

## 2024-01-29 DIAGNOSIS — Z885 Allergy status to narcotic agent status: Secondary | ICD-10-CM

## 2024-01-29 DIAGNOSIS — I5033 Acute on chronic diastolic (congestive) heart failure: Secondary | ICD-10-CM

## 2024-01-29 DIAGNOSIS — L089 Local infection of the skin and subcutaneous tissue, unspecified: Secondary | ICD-10-CM | POA: Diagnosis not present

## 2024-01-29 DIAGNOSIS — D638 Anemia in other chronic diseases classified elsewhere: Secondary | ICD-10-CM | POA: Diagnosis not present

## 2024-01-29 DIAGNOSIS — I2510 Atherosclerotic heart disease of native coronary artery without angina pectoris: Secondary | ICD-10-CM | POA: Diagnosis present

## 2024-01-29 DIAGNOSIS — D649 Anemia, unspecified: Secondary | ICD-10-CM | POA: Diagnosis not present

## 2024-01-29 DIAGNOSIS — I959 Hypotension, unspecified: Secondary | ICD-10-CM | POA: Diagnosis not present

## 2024-01-29 DIAGNOSIS — J849 Interstitial pulmonary disease, unspecified: Secondary | ICD-10-CM | POA: Diagnosis not present

## 2024-01-29 DIAGNOSIS — I739 Peripheral vascular disease, unspecified: Secondary | ICD-10-CM | POA: Diagnosis not present

## 2024-01-29 DIAGNOSIS — E875 Hyperkalemia: Secondary | ICD-10-CM | POA: Diagnosis present

## 2024-01-29 DIAGNOSIS — Z961 Presence of intraocular lens: Secondary | ICD-10-CM | POA: Diagnosis present

## 2024-01-29 DIAGNOSIS — L03115 Cellulitis of right lower limb: Secondary | ICD-10-CM | POA: Diagnosis present

## 2024-01-29 DIAGNOSIS — I5023 Acute on chronic systolic (congestive) heart failure: Secondary | ICD-10-CM | POA: Diagnosis not present

## 2024-01-29 DIAGNOSIS — I70221 Atherosclerosis of native arteries of extremities with rest pain, right leg: Secondary | ICD-10-CM | POA: Diagnosis not present

## 2024-01-29 DIAGNOSIS — I5021 Acute systolic (congestive) heart failure: Secondary | ICD-10-CM | POA: Diagnosis not present

## 2024-01-29 DIAGNOSIS — I4892 Unspecified atrial flutter: Secondary | ICD-10-CM

## 2024-01-29 DIAGNOSIS — Z8249 Family history of ischemic heart disease and other diseases of the circulatory system: Secondary | ICD-10-CM

## 2024-01-29 DIAGNOSIS — E781 Pure hyperglyceridemia: Secondary | ICD-10-CM | POA: Diagnosis present

## 2024-01-29 DIAGNOSIS — J95859 Other complication of respirator [ventilator]: Secondary | ICD-10-CM | POA: Diagnosis not present

## 2024-01-29 DIAGNOSIS — Z87891 Personal history of nicotine dependence: Secondary | ICD-10-CM | POA: Diagnosis not present

## 2024-01-29 DIAGNOSIS — K219 Gastro-esophageal reflux disease without esophagitis: Secondary | ICD-10-CM | POA: Diagnosis present

## 2024-01-29 DIAGNOSIS — Z751 Person awaiting admission to adequate facility elsewhere: Secondary | ICD-10-CM

## 2024-01-29 DIAGNOSIS — Z794 Long term (current) use of insulin: Secondary | ICD-10-CM

## 2024-01-29 DIAGNOSIS — J9621 Acute and chronic respiratory failure with hypoxia: Secondary | ICD-10-CM | POA: Diagnosis not present

## 2024-01-29 DIAGNOSIS — M86171 Other acute osteomyelitis, right ankle and foot: Secondary | ICD-10-CM | POA: Diagnosis not present

## 2024-01-29 DIAGNOSIS — I96 Gangrene, not elsewhere classified: Secondary | ICD-10-CM | POA: Diagnosis present

## 2024-01-29 DIAGNOSIS — E1140 Type 2 diabetes mellitus with diabetic neuropathy, unspecified: Secondary | ICD-10-CM | POA: Diagnosis present

## 2024-01-29 DIAGNOSIS — E785 Hyperlipidemia, unspecified: Secondary | ICD-10-CM | POA: Diagnosis not present

## 2024-01-29 DIAGNOSIS — I272 Pulmonary hypertension, unspecified: Secondary | ICD-10-CM | POA: Diagnosis present

## 2024-01-29 DIAGNOSIS — E1142 Type 2 diabetes mellitus with diabetic polyneuropathy: Secondary | ICD-10-CM | POA: Diagnosis not present

## 2024-01-29 DIAGNOSIS — F32A Depression, unspecified: Secondary | ICD-10-CM | POA: Diagnosis present

## 2024-01-29 DIAGNOSIS — R5381 Other malaise: Secondary | ICD-10-CM | POA: Diagnosis present

## 2024-01-29 DIAGNOSIS — E1165 Type 2 diabetes mellitus with hyperglycemia: Secondary | ICD-10-CM | POA: Diagnosis not present

## 2024-01-29 DIAGNOSIS — T380X5A Adverse effect of glucocorticoids and synthetic analogues, initial encounter: Secondary | ICD-10-CM | POA: Diagnosis not present

## 2024-01-29 DIAGNOSIS — R7989 Other specified abnormal findings of blood chemistry: Secondary | ICD-10-CM | POA: Diagnosis not present

## 2024-01-29 DIAGNOSIS — Z886 Allergy status to analgesic agent status: Secondary | ICD-10-CM

## 2024-01-29 DIAGNOSIS — S78111A Complete traumatic amputation at level between right hip and knee, initial encounter: Secondary | ICD-10-CM | POA: Diagnosis not present

## 2024-01-29 DIAGNOSIS — Z9981 Dependence on supplemental oxygen: Secondary | ICD-10-CM

## 2024-01-29 DIAGNOSIS — I480 Paroxysmal atrial fibrillation: Secondary | ICD-10-CM | POA: Diagnosis present

## 2024-01-29 DIAGNOSIS — J9601 Acute respiratory failure with hypoxia: Secondary | ICD-10-CM | POA: Diagnosis not present

## 2024-01-29 DIAGNOSIS — Z79899 Other long term (current) drug therapy: Secondary | ICD-10-CM

## 2024-01-29 DIAGNOSIS — D84821 Immunodeficiency due to drugs: Secondary | ICD-10-CM | POA: Diagnosis present

## 2024-01-29 DIAGNOSIS — D62 Acute posthemorrhagic anemia: Secondary | ICD-10-CM | POA: Diagnosis not present

## 2024-01-29 DIAGNOSIS — I5022 Chronic systolic (congestive) heart failure: Secondary | ICD-10-CM | POA: Diagnosis not present

## 2024-01-29 DIAGNOSIS — Z7901 Long term (current) use of anticoagulants: Secondary | ICD-10-CM

## 2024-01-29 DIAGNOSIS — Z79624 Long term (current) use of inhibitors of nucleotide synthesis: Secondary | ICD-10-CM

## 2024-01-29 DIAGNOSIS — Z9911 Dependence on respirator [ventilator] status: Secondary | ICD-10-CM

## 2024-01-29 DIAGNOSIS — E119 Type 2 diabetes mellitus without complications: Secondary | ICD-10-CM

## 2024-01-29 DIAGNOSIS — Z951 Presence of aortocoronary bypass graft: Secondary | ICD-10-CM

## 2024-01-29 LAB — CBC
HCT: 37.8 % — ABNORMAL LOW (ref 39.0–52.0)
Hemoglobin: 11.1 g/dL — ABNORMAL LOW (ref 13.0–17.0)
MCH: 25.3 pg — ABNORMAL LOW (ref 26.0–34.0)
MCHC: 29.4 g/dL — ABNORMAL LOW (ref 30.0–36.0)
MCV: 86.1 fL (ref 80.0–100.0)
Platelets: 268 K/uL (ref 150–400)
RBC: 4.39 MIL/uL (ref 4.22–5.81)
RDW: 16.6 % — ABNORMAL HIGH (ref 11.5–15.5)
WBC: 15.2 K/uL — ABNORMAL HIGH (ref 4.0–10.5)
nRBC: 0 % (ref 0.0–0.2)

## 2024-01-29 LAB — BASIC METABOLIC PANEL WITH GFR
Anion gap: 13 (ref 5–15)
BUN: 15 mg/dL (ref 8–23)
CO2: 24 mmol/L (ref 22–32)
Calcium: 8.9 mg/dL (ref 8.9–10.3)
Chloride: 100 mmol/L (ref 98–111)
Creatinine, Ser: 0.67 mg/dL (ref 0.61–1.24)
GFR, Estimated: >60 mL/min
Glucose, Bld: 81 mg/dL (ref 70–99)
Potassium: 4.2 mmol/L (ref 3.5–5.1)
Sodium: 137 mmol/L (ref 135–145)

## 2024-01-29 LAB — HEMOGLOBIN A1C
Hgb A1c MFr Bld: 6.8 % — ABNORMAL HIGH (ref 4.8–5.6)
Mean Plasma Glucose: 148.46 mg/dL

## 2024-01-29 LAB — PROTIME-INR
INR: 1.3 — ABNORMAL HIGH (ref 0.8–1.2)
Prothrombin Time: 16.7 s — ABNORMAL HIGH (ref 11.4–15.2)

## 2024-01-29 LAB — PHOSPHORUS: Phosphorus: 3.7 mg/dL (ref 2.5–4.6)

## 2024-01-29 LAB — GLUCOSE, CAPILLARY: Glucose-Capillary: 75 mg/dL (ref 70–99)

## 2024-01-29 LAB — MAGNESIUM: Magnesium: 1.7 mg/dL (ref 1.7–2.4)

## 2024-01-29 MED ORDER — SODIUM CHLORIDE 0.9 % IV SOLN: INTRAVENOUS | Status: AC

## 2024-01-29 MED ORDER — VANCOMYCIN HCL 1500 MG/300ML IV SOLN
1500.0000 mg | INTRAVENOUS | Status: DC
  Administered 2024-01-30 – 2024-02-01 (×3): 1500 mg via INTRAVENOUS
  Filled 2024-01-29 (×4): qty 300

## 2024-01-29 MED ORDER — SODIUM CHLORIDE 0.9 % IV SOLN: INTRAVENOUS | Status: DC

## 2024-01-29 MED ORDER — ENOXAPARIN SODIUM 40 MG/0.4ML IJ SOSY: 40.0000 mg | PREFILLED_SYRINGE | INTRAMUSCULAR | Status: DC

## 2024-01-29 MED ORDER — ATORVASTATIN CALCIUM 10 MG PO TABS: 10.0000 mg | ORAL_TABLET | Freq: Every day | ORAL | Status: DC

## 2024-01-29 MED ORDER — ONDANSETRON HCL 4 MG/2ML IJ SOLN
4.0000 mg | Freq: Four times a day (QID) | INTRAMUSCULAR | Status: DC | PRN
  Administered 2024-02-01: 4 mg via INTRAVENOUS
  Filled 2024-01-29: qty 2

## 2024-01-29 MED ORDER — ONDANSETRON HCL 4 MG PO TABS: 4.0000 mg | ORAL_TABLET | Freq: Four times a day (QID) | ORAL | Status: DC | PRN

## 2024-01-29 MED ORDER — ASPIRIN 81 MG PO TBEC
81.0000 mg | DELAYED_RELEASE_TABLET | Freq: Every day | ORAL | Status: DC
  Administered 2024-01-29: 81 mg via ORAL
  Filled 2024-01-29: qty 1

## 2024-01-29 MED ORDER — VITAMIN D 25 MCG (1000 UNIT) PO TABS
2000.0000 [IU] | ORAL_TABLET | Freq: Every day | ORAL | Status: DC
  Administered 2024-01-29: 2000 [IU] via ORAL
  Filled 2024-01-29: qty 2

## 2024-01-29 MED ORDER — ATORVASTATIN CALCIUM 40 MG PO TABS: 40.0000 mg | ORAL_TABLET | Freq: Every day | ORAL | Status: DC

## 2024-01-29 MED ORDER — SODIUM CHLORIDE 0.9 % IV SOLN
2.0000 g | Freq: Three times a day (TID) | INTRAVENOUS | Status: DC
  Administered 2024-01-29 – 2024-02-02 (×10): 2 g via INTRAVENOUS
  Filled 2024-01-29 (×11): qty 12.5

## 2024-01-29 MED ORDER — SODIUM CHLORIDE 0.9% FLUSH: 3.0000 mL | Freq: Two times a day (BID) | INTRAVENOUS | Status: DC

## 2024-01-29 MED ORDER — VITAMIN C 500 MG PO TABS
500.0000 mg | ORAL_TABLET | Freq: Every day | ORAL | Status: DC
  Administered 2024-01-29 – 2024-02-14 (×16): 500 mg via ORAL
  Filled 2024-01-29 (×17): qty 1

## 2024-01-29 MED ORDER — QUINAPRIL HCL 10 MG PO TABS: 40.0000 mg | ORAL_TABLET | Freq: Every day | ORAL | Status: DC

## 2024-01-29 MED ORDER — SENNOSIDES-DOCUSATE SODIUM 8.6-50 MG PO TABS
1.0000 | ORAL_TABLET | Freq: Every evening | ORAL | Status: DC | PRN
  Administered 2024-02-03 – 2024-02-13 (×3): 1 via ORAL
  Filled 2024-01-29 (×3): qty 1

## 2024-01-29 MED ORDER — ADULT MULTIVITAMIN W/MINERALS CH
1.0000 | ORAL_TABLET | Freq: Every day | ORAL | Status: DC
  Administered 2024-01-29: 1 via ORAL
  Filled 2024-01-29 (×2): qty 1

## 2024-01-29 MED ORDER — SODIUM CHLORIDE 0.9% FLUSH: 3.0000 mL | INTRAVENOUS | Status: DC | PRN

## 2024-01-29 MED ORDER — BISACODYL 5 MG PO TBEC: 5.0000 mg | DELAYED_RELEASE_TABLET | Freq: Every day | ORAL | Status: DC | PRN

## 2024-01-29 MED ORDER — FLEET ENEMA RE ENEM: 1.0000 | ENEMA | Freq: Once | RECTAL | Status: DC | PRN

## 2024-01-29 MED ORDER — HYDROMORPHONE HCL 1 MG/ML IJ SOLN: 0.5000 mg | INTRAMUSCULAR | Status: DC | PRN

## 2024-01-29 MED ORDER — PANTOPRAZOLE SODIUM 40 MG PO TBEC
40.0000 mg | DELAYED_RELEASE_TABLET | Freq: Every day | ORAL | Status: DC
  Administered 2024-01-29: 40 mg via ORAL
  Filled 2024-01-29 (×2): qty 1

## 2024-01-29 MED ORDER — CYCLOBENZAPRINE HCL 10 MG PO TABS: 10.0000 mg | ORAL_TABLET | Freq: Three times a day (TID) | ORAL | Status: DC | PRN

## 2024-01-29 MED ORDER — ONDANSETRON HCL 4 MG/2ML IJ SOLN: 4.0000 mg | Freq: Four times a day (QID) | INTRAMUSCULAR | Status: DC | PRN

## 2024-01-29 MED ORDER — IPRATROPIUM BROMIDE 0.02 % IN SOLN: 0.5000 mg | Freq: Four times a day (QID) | RESPIRATORY_TRACT | Status: DC | PRN

## 2024-01-29 MED ORDER — TRAMADOL HCL 50 MG PO TABS
50.0000 mg | ORAL_TABLET | Freq: Four times a day (QID) | ORAL | Status: DC
  Administered 2024-01-29 – 2024-01-30 (×2): 50 mg via ORAL
  Filled 2024-01-29 (×2): qty 1

## 2024-01-29 MED ORDER — INSULIN ASPART 100 UNIT/ML IJ SOLN: 0.0000 [IU] | Freq: Three times a day (TID) | INTRAMUSCULAR | Status: DC

## 2024-01-29 MED ORDER — ACETAMINOPHEN 325 MG PO TABS
650.0000 mg | ORAL_TABLET | Freq: Three times a day (TID) | ORAL | Status: DC
  Administered 2024-01-29 – 2024-02-07 (×25): 650 mg via ORAL
  Filled 2024-01-29 (×27): qty 2

## 2024-01-29 MED ORDER — TRAMADOL HCL 50 MG PO TABS: 50.0000 mg | ORAL_TABLET | Freq: Four times a day (QID) | ORAL | Status: DC | PRN

## 2024-01-29 MED ORDER — TRAZODONE HCL 50 MG PO TABS: 25.0000 mg | ORAL_TABLET | Freq: Every evening | ORAL | Status: DC | PRN

## 2024-01-29 MED ORDER — SODIUM CHLORIDE 0.9 % IV SOLN: 250.0000 mL | INTRAVENOUS | Status: AC | PRN

## 2024-01-29 MED ORDER — SODIUM CHLORIDE 0.9% FLUSH
3.0000 mL | Freq: Two times a day (BID) | INTRAVENOUS | Status: DC
  Administered 2024-01-29 – 2024-02-14 (×30): 3 mL via INTRAVENOUS

## 2024-01-29 MED ORDER — GEMFIBROZIL 600 MG PO TABS
600.0000 mg | ORAL_TABLET | Freq: Two times a day (BID) | ORAL | Status: DC
  Administered 2024-01-29: 600 mg via ORAL
  Filled 2024-01-29 (×2): qty 1

## 2024-01-29 MED ORDER — HYDRALAZINE HCL 20 MG/ML IJ SOLN: 10.0000 mg | INTRAMUSCULAR | Status: DC | PRN

## 2024-01-29 MED ORDER — HYDROCODONE-ACETAMINOPHEN 5-325 MG PO TABS: 1.0000 | ORAL_TABLET | ORAL | Status: DC | PRN

## 2024-01-29 MED ORDER — HEPARIN SODIUM (PORCINE) 5000 UNIT/ML IJ SOLN
5000.0000 [IU] | Freq: Three times a day (TID) | INTRAMUSCULAR | Status: DC
  Administered 2024-01-29 – 2024-02-01 (×8): 5000 [IU] via SUBCUTANEOUS
  Filled 2024-01-29 (×9): qty 1

## 2024-01-29 MED ORDER — POLYETHYLENE GLYCOL 3350 17 G PO PACK: 17.0000 g | PACK | Freq: Every day | ORAL | Status: DC | PRN

## 2024-01-29 NOTE — H&P (Signed)
 " [facility] History and Physical  Reginald [name] FMW:[id number] DOB: 18-Jun-1935 DOA: 01/29/2024  Referring physician: ED  PCP: System, Provider Not In   Patient is coming from: Home  Chief Complaint: Right foot pain  HPI:  Patient is a 89 years old male with history of type 2 diabetes, coronary artery disease status post CABG, interstitial lung disease with chronic hypoxic respiratory failure on 5 L of oxygen at baseline, chronic systolic heart failure with reduced ejection fraction with last known ejection fraction of 25 to 30% and severe peripheral artery disease with right foot gangrene who has recently completed antibiotics for right foot infection 3 weeks back presented to hospital with worsening pain and redness of the right foot.  Patient has noticed worsening discoloration of the toes and pain constantly present even at rest.  He was initially at the outside hospital and had labs drawn which showed WBC at 17.1 with lactate of 2.2 with ESR of 22 and CRP of 15.2.  X-ray of the foot did not show any acute abnormality.  Patient was given IV vancomycin  and Zosyn.  Vascular surgery Dr. Silver was notified who stated that the patient will be seen in consultation and was transferred to the hospital for further evaluation and treatment.  At the time of interview, patient complains of a mild cough but denies any increasing shortness of breath or dyspnea.  He feels like he is at his baseline with his breathing.  Denies any dizziness nausea vomiting but has been having generalized weakness and fatigue.  Patient denies any fever, chills, rigor.  Patient denies any nausea, vomiting or abdominal pain.  Denies any urinary urgency, frequency or dysuria.  No labs have been available since patient was a direct admit.  Assessment and Plan Principal Problem:   Foot infection Active Problems:   Gangrene of right foot (HCC)   DM II (diabetes mellitus, type II), controlled (HCC)   HFrEF (heart failure with  reduced ejection fraction) (HCC)   Right foot cellulitis with ischemic gangrene of forefoot patient is afebrile complains of pain.  Continue analgesia.  Continue hydration, keep n.p.o. after midnight.  Will hold CellCept for now.  Severe peripheral vascular disease with ischemic gangrene of the right foot..  Vascular surgery Dr. Lanis has been notified.  Follow recommendation.  Focus on analgesia.  CAD status post CABG.  Continue Lipitor aspirin ,  Gemfibrozil ,  Losartan  and metoprolol  from home.  Interstitial lung disease with chronic hypoxic respiratory failure.  Was on prednisone  as outpatient.  Patient currently on nonrebreather mask.  Patient is afebrile.  On 5% oxygen at baseline.  Has been on oxygen for 2 years or so and takes CellCept.  Diabetes mellitus type 2.  On metformin  and Jardiance as outpatient.  Continue with sliding scale insulin  while in the hospital, diabetic diet for today.  No labs available will get stat labs.  Chronic systolic CHF.  Will hold Jardiance for now.  Will continue to monitor intake and output charting Daily weights.  No signs of overt decompensation.  Will start gentle fluids after midnight.  Hold ACE inhibitors for now.  No blood work available so far.  DVT Prophylaxis: Heparin  subcu.  Review of Systems:  All systems were reviewed and were negative unless otherwise mentioned in the HPI   Past Medical History:  Diagnosis Date   Arthritis    Complication of anesthesia    2014 BP DROPPED S/P BACK SURGERY REC. IV FLUIDS   Diabetes mellitus without complication (HCC)  Disc displacement, lumbar    Full dentures    GERD (gastroesophageal reflux disease)    Hearing deficit    does not wear H/A   History of kidney stones    Hypertension    Kidney stone    Neuromuscular disorder (HCC)    Pneumonia    Umbilical hernia    Wears glasses    Past Surgical History:  Procedure Laterality Date   BACK SURGERY     CATARACT EXTRACTION W/ INTRAOCULAR LENS   IMPLANT, BILATERAL     COLONOSCOPY W/ BIOPSIES AND POLYPECTOMY     LUMBAR LAMINECTOMY/DECOMPRESSION MICRODISCECTOMY Right 08/30/2016   Procedure: Right lumbar three-four microdiscectomy;  Surgeon: Mavis Purchase, MD;  Location: [facility] OR;  Service: Neurosurgery;  Laterality: Right;   MULTIPLE TOOTH EXTRACTIONS     NOSE SURGERY     deviated septum, and blockage   rottor cuff repair      left     Social History:  reports that he has quit smoking. His smoking use included cigarettes. He has quit using smokeless tobacco.  His smokeless tobacco use included chew. He reports that he does not drink alcohol and does not use drugs.  Allergies[1]  Family History  Problem Relation Age of Onset   Stroke Mother    Heart disease Father    Aneurysm Father    Aplastic anemia Sister    Heart disease Other      Prior to Admission medications  Medication Sig Start Date End Date Taking? Authorizing Provider  acetaminophen  (TYLENOL  8 HOUR ARTHRITIS PAIN) 650 MG CR tablet Take 650 mg by mouth 3 (three) times daily.    [provider]  aspirin  EC 81 MG tablet Take 81 mg by mouth at bedtime.     [provider]  atorvastatin  (LIPITOR) 10 MG tablet Take 10 mg by mouth daily with lunch. 07/25/16   [provider]  B Complex Vitamins (B COMPLEX 100 PO) Take 1 tablet by mouth daily at 2 PM.    [provider]  chlorthalidone  (HYGROTON ) 25 MG tablet Take 25 mg by mouth daily.    [provider]  Cholecalciferol  (VITAMIN D3) 2000 units TABS Take 2,000 Units by mouth daily at 2 PM.    [provider]  cyclobenzaprine  (FLEXERIL ) 10 MG tablet Take 1 tablet (10 mg total) by mouth 3 (three) times daily as needed for muscle spasms. 08/31/16   Costella, Vincent J, PA-C  fexofenadine (ALLEGRA) 180 MG tablet Take 180 mg by mouth daily as needed (for allergies.).     [provider]  gemfibrozil  (LOPID ) 600 MG tablet Take 600 mg by mouth 2 (two) times daily before a  meal.    [provider]  HYDROcodone -acetaminophen  (NORCO/VICODIN) 5-325 MG tablet Take 1 tablet by mouth every 4 (four) hours as needed. For pain. 08/31/16   Costella, Vincent J, PA-C  levofloxacin (LEVAQUIN) 500 MG tablet Take 500 mg by mouth daily as needed (for urinary discomfort).    [provider]  metFORMIN  (GLUCOPHAGE ) 1000 MG tablet Take 1,000 mg by mouth 2 (two) times daily. 07/25/16   [provider]  montelukast  (SINGULAIR ) 10 MG tablet Take 10 mg by mouth daily at 2 PM.     [provider]  Multiple Vitamin (MULTIVITAMIN WITH MINERALS) TABS tablet Take 1 tablet by mouth daily.    [provider]  Omega-3 Fatty Acids (FISH OIL) 1000 MG CAPS Take 1,000 mg by mouth every evening.    [provider]  omeprazole (PRILOSEC) 40 MG capsule Take 40 mg by mouth daily before breakfast.     [provider]  quinapril  (ACCUPRIL ) 40 MG tablet Take 40 mg by mouth daily.    [provider]  sodium chloride  (OCEAN) 0.65 % SOLN nasal spray Place 1 spray into both nostrils 2 (two) times daily. Saline Mixture patient makes.    [provider]  vitamin C  (ASCORBIC ACID ) 500 MG tablet Take 500 mg by mouth daily at 2 PM.    [provider]    Physical Exam:  Vitals:   01/29/24 1613  BP: 135/82  Pulse: (!) 108  Resp: 20  Temp: 97.9 F (36.6 C)  TempSrc: Axillary  SpO2: 100%   Wt Readings from Last 3 Encounters:  08/30/16 88.5 kg  05/21/14 94.4 kg  05/14/14 94.4 kg   There is no height or weight on file to calculate BMI.  General:  Average built, not in obvious distress elderly male frail appearing, on nonrebreather mask, HENT: Normocephalic, No scleral pallor or icterus noted. Oral mucosa is moist.  Chest: Respirations bilaterally, coarse breath sounds noted.  CVS: S1 &S2 heard. No murmur.  Regular rate and rhythm. Abdomen: Soft, nontender, nondistended.  Bowel sounds are heard. No abdominal mass  palpated Extremities: No cyanosis, clubbing right lower extremity with ischemic gangrene and erythema over the lower foot.   Psych: Alert, awake and oriented, normal mood CNS:  No cranial nerve deficits.  Moves all the limbs..   Skin: Warm and dry.  Right foot with ischemic gangrene.       Labs on Admission:   CBC: No results for input(s): WBC, NEUTROABS, HGB, HCT, MCV, PLT in the last 168 hours.  Basic Metabolic Panel: No results for input(s): NA, K, CL, CO2, GLUCOSE, BUN, CREATININE, CALCIUM , MG, PHOS in the last 168 hours.  Liver Function Tests: No results for input(s): AST, ALT, ALKPHOS, BILITOT, PROT, ALBUMIN  in the last 168 hours. No results for input(s): LIPASE, AMYLASE in the last 168 hours. No results for input(s): AMMONIA in the last 168 hours.  Cardiac Enzymes: No results for input(s): CKTOTAL, CKMB, CKMBINDEX, TROPONINI in the last 168 hours.  BNP (last 3 results) No results for input(s): BNP in the last 8760 hours.  ProBNP (last 3 results) No results for input(s): PROBNP in the last 8760 hours.  CBG: Recent Labs  Lab 01/29/24 1646  GLUCAP 75    Lipase  No results found for: LIPASE   Urinalysis No results found for: COLORURINE, APPEARANCEUR, LABSPEC, PHURINE, GLUCOSEU, HGBUR, BILIRUBINUR, KETONESUR, PROTEINUR, UROBILINOGEN, NITRITE, LEUKOCYTESUR   Drugs of Abuse  No results found for: LABOPIA, COCAINSCRNUR, LABBENZ, AMPHETMU, THCU, LABBARB    Radiological Exams on Admission: No results found.  EKG: Not available for review.  Will get 1.   Consultant: Vascular surgery Dr. Silver  Code Status: Full code.  Discussed with the patient's spouse at bedside  Microbiology none  Antibiotics: Vancomycin  and cefepime .  Family Communication:  Patients' condition and plan of care including tests being ordered have been discussed with the patient and spouse  who indicate understanding and agree with the plan.   Status is: Inpatient   Severity of Illness: The appropriate patient status for this patient is INPATIENT. Inpatient status is judged to be reasonable and necessary in order to provide the required intensity of service to ensure the patient's safety. The patient's presenting symptoms, physical exam findings, and initial radiographic and laboratory data in the context of their chronic  comorbidities is felt to place them at high risk for further clinical deterioration. Furthermore, it is not anticipated that the patient will be medically stable for discharge from the hospital within 2 midnights of admission.   * I certify that at the point of admission it is my clinical judgment that the patient will require inpatient hospital care spanning beyond 2 midnights from the point of admission due to high intensity of service, high risk for further deterioration and high frequency of surveillance required.*  Signed, Vernal Alstrom, MD [facility] 01/29/2024        [1]  Allergies Allergen Reactions   Naproxen Itching and Rash   Roxicet [Oxycodone-Acetaminophen ] Itching and Rash   "

## 2024-01-29 NOTE — Hospital Course (Signed)
 Patient is a 89 years old male with history of type 2 diabetes, coronary artery disease status post CABG, interstitial lung disease with chronic hypoxic respiratory failure on 5 L of oxygen at baseline, chronic systolic heart failure with reduced ejection fraction with last known ejection fraction of 25 to 30% and severe peripheral artery disease with right foot gangrene who has recently completed antibiotics for right foot infection 3 weeks back presented to hospital with worsening pain and redness of the right foot.  Patient has noticed worsening discoloration of the toes and pain constantly present even at rest.  He was initially at the outside hospital and had labs drawn which showed WBC at 17.1 with lactate of 2.2 with ESR of 22 and CRP of 15.2.  X-ray of the foot did not show any acute abnormality.  Patient was given IV vancomycin  and Zosyn.  Vascular surgery Dr. Silver was notified who stated that the patient will be seen in consultation and was transferred to the hospital for further evaluation and treatment.   At the time of interview, patient complains of a mild cough but denies any increasing shortness of breath or dyspnea.  He feels like he is at his baseline with his breathing.  Denies any dizziness nausea vomiting but has been having generalized weakness and fatigue.  Patient denies any fever, chills, rigor.  Patient denies any nausea, vomiting or abdominal pain.  Denies any urinary urgency, frequency or dysuria.  No labs have been available since patient was a direct admit.  Right foot cellulitis with ischemic gangrene of forefoot patient is afebrile complains of pain.  Continue analgesia.  Continue hydration, keep n.p.o. after midnight.  Will hold CellCept for now.   Severe peripheral vascular disease with ischemic gangrene of the right foot..  Vascular surgery Dr. Lanis has been notified.  Follow recommendation.  Focus on analgesia.   CAD status post CABG.  Continue Lipitor aspirin ,   Gemfibrozil ,  Losartan  and metoprolol  from home.   Interstitial lung disease with chronic hypoxic respiratory failure.  Was on prednisone  as outpatient.  Patient currently on nonrebreather mask.  Patient is afebrile.  On 5% oxygen at baseline.  Has been on oxygen for 2 years or so and takes CellCept.   Diabetes mellitus type 2.  On metformin  and Jardiance as outpatient.  Continue with sliding scale insulin  while in the hospital, diabetic diet for today.  No labs available will get stat labs.   Chronic systolic CHF.  Will hold Jardiance for now.  Will continue to monitor intake and output charting Daily weights.  No signs of overt decompensation.  Will start gentle fluids after midnight.  Hold ACE inhibitors for now.  No blood work available so far.

## 2024-01-29 NOTE — Assessment & Plan Note (Signed)
 Acute on chronic right foot infection, cellulitis, gangrene Secondary to severe peripheral artery disease Progressively getting worse -Has been on IV antibiotics Zosyn/vancomycin 

## 2024-01-29 NOTE — Progress Notes (Signed)
 EKG done as ordered, MD made aware, and placed in chart.

## 2024-01-29 NOTE — Progress Notes (Signed)
 Pharmacy Antibiotic Note  Reginald [name] is a 89 y.o. male admitted on 01/29/2024 with R foot cellulitis with ischemic gangrene of forefoot. Pharmacy has been consulted for Vancomycin  and Cefepime  dosing.  Pt presented from OSH where he received Zosyn 3.375gm IV and Vancomycin  1500mg  IV ~1200.  Plan: Cefepime  2gm IV q8h Vancomycin  1500 mg IV Q 24 hrs. Goal AUC 400-550. Expected AUC: 410 SCr used: 0.8 Will f/u renal function, micro data, and pt's clinical condition Vanc levels prn F/u cultures (if drawn at OSH)   Height: 5' 8 (172.7 cm) Weight: 88 kg (194 lb 0.1 oz) IBW/kg (Calculated) : 68.4  Temp (24hrs), Avg:98 F (36.7 C), Min:97.9 F (36.6 C), Max:98.1 F (36.7 C)  Recent Labs  Lab 01/29/24 1849  WBC 15.2*  CREATININE 0.67    Estimated Creatinine Clearance: 71.4 mL/min (by C-G formula based on SCr of 0.67 mg/dL).    Allergies[1]  Antimicrobials this admission: 1/12 Vanc >>  1/12 Cefepime  >>    Microbiology results: Pending  Thank you for allowing pharmacy to be a part of this patients care.  Vito Ralph, PharmD, BCPS Please see amion for complete clinical pharmacist phone list 01/29/2024 7:59 PM     [1]  Allergies Allergen Reactions   Naproxen Itching and Rash   Roxicet [Oxycodone-Acetaminophen ] Itching and Rash

## 2024-01-29 NOTE — Assessment & Plan Note (Signed)
 Holding home medication metformin  -Checking CBG q. ACHS, SSI coverage Last A1c 6.8

## 2024-01-29 NOTE — ED Provider Notes (Addendum)
 ED Progress Note  Patient care turned over to me pending transport/bed assignment for [facility] in [place].  Patient's family want him to be transferred there specifically.  We called the transfer center but states there is still 2 patients in front of him that we will get beds.  Patient continues to have stable vital signs here.  I spoke to pharmacy about continuing the patient's antibiotics for as long as he is in our emergency department.  Patient has a bed assignment at [facility] acceptance by Dr. Charlton

## 2024-01-29 NOTE — Assessment & Plan Note (Signed)
-   Monitoring fluid -Monitoring I's and O's closely, daily weight -Review home medications:   Last echoLast echo from 03/28/2023 EF 25-30%,

## 2024-01-30 ENCOUNTER — Inpatient Hospital Stay (HOSPITAL_COMMUNITY): Admitting: Anesthesiology

## 2024-01-30 ENCOUNTER — Other Ambulatory Visit: Payer: Self-pay

## 2024-01-30 ENCOUNTER — Inpatient Hospital Stay (HOSPITAL_COMMUNITY)

## 2024-01-30 ENCOUNTER — Encounter (HOSPITAL_COMMUNITY)
Admission: RE | Disposition: A | Discharge status: Rehabilitation Facility | Payer: Self-pay | Source: Other Acute Inpatient Hospital | Attending: Family Medicine

## 2024-01-30 DIAGNOSIS — D649 Anemia, unspecified: Secondary | ICD-10-CM

## 2024-01-30 DIAGNOSIS — I2510 Atherosclerotic heart disease of native coronary artery without angina pectoris: Secondary | ICD-10-CM

## 2024-01-30 DIAGNOSIS — I70261 Atherosclerosis of native arteries of extremities with gangrene, right leg: Secondary | ICD-10-CM | POA: Diagnosis not present

## 2024-01-30 DIAGNOSIS — Z9911 Dependence on respirator [ventilator] status: Secondary | ICD-10-CM

## 2024-01-30 DIAGNOSIS — I5022 Chronic systolic (congestive) heart failure: Secondary | ICD-10-CM

## 2024-01-30 DIAGNOSIS — I480 Paroxysmal atrial fibrillation: Secondary | ICD-10-CM

## 2024-01-30 DIAGNOSIS — J9601 Acute respiratory failure with hypoxia: Secondary | ICD-10-CM

## 2024-01-30 DIAGNOSIS — Z87891 Personal history of nicotine dependence: Secondary | ICD-10-CM

## 2024-01-30 DIAGNOSIS — E785 Hyperlipidemia, unspecified: Secondary | ICD-10-CM

## 2024-01-30 DIAGNOSIS — I70221 Atherosclerosis of native arteries of extremities with rest pain, right leg: Secondary | ICD-10-CM | POA: Diagnosis not present

## 2024-01-30 DIAGNOSIS — J95859 Other complication of respirator [ventilator]: Secondary | ICD-10-CM

## 2024-01-30 DIAGNOSIS — I110 Hypertensive heart disease with heart failure: Secondary | ICD-10-CM

## 2024-01-30 DIAGNOSIS — M86171 Other acute osteomyelitis, right ankle and foot: Secondary | ICD-10-CM

## 2024-01-30 DIAGNOSIS — E1140 Type 2 diabetes mellitus with diabetic neuropathy, unspecified: Secondary | ICD-10-CM

## 2024-01-30 DIAGNOSIS — L03115 Cellulitis of right lower limb: Secondary | ICD-10-CM | POA: Diagnosis not present

## 2024-01-30 DIAGNOSIS — L089 Local infection of the skin and subcutaneous tissue, unspecified: Secondary | ICD-10-CM | POA: Diagnosis not present

## 2024-01-30 DIAGNOSIS — J8410 Pulmonary fibrosis, unspecified: Secondary | ICD-10-CM

## 2024-01-30 DIAGNOSIS — F32A Depression, unspecified: Secondary | ICD-10-CM

## 2024-01-30 HISTORY — PX: AMPUTATION: SHX166

## 2024-01-30 LAB — CBC
HCT: 36.2 % — ABNORMAL LOW (ref 39.0–52.0)
HCT: 34.2 % — ABNORMAL LOW (ref 39.0–52.0)
Hemoglobin: 11 g/dL — ABNORMAL LOW (ref 13.0–17.0)
Hemoglobin: 9.8 g/dL — ABNORMAL LOW (ref 13.0–17.0)
MCH: 25.9 pg — ABNORMAL LOW (ref 26.0–34.0)
MCH: 25.1 pg — ABNORMAL LOW (ref 26.0–34.0)
MCHC: 30.4 g/dL (ref 30.0–36.0)
MCHC: 28.7 g/dL — ABNORMAL LOW (ref 30.0–36.0)
MCV: 85.2 fL (ref 80.0–100.0)
MCV: 87.5 fL (ref 80.0–100.0)
Platelets: 269 K/uL (ref 150–400)
Platelets: 279 K/uL (ref 150–400)
RBC: 4.25 MIL/uL (ref 4.22–5.81)
RBC: 3.91 MIL/uL — ABNORMAL LOW (ref 4.22–5.81)
RDW: 16.7 % — ABNORMAL HIGH (ref 11.5–15.5)
RDW: 16.6 % — ABNORMAL HIGH (ref 11.5–15.5)
WBC: 13.1 K/uL — ABNORMAL HIGH (ref 4.0–10.5)
WBC: 15.5 K/uL — ABNORMAL HIGH (ref 4.0–10.5)
nRBC: 0 % (ref 0.0–0.2)
nRBC: 0 % (ref 0.0–0.2)

## 2024-01-30 LAB — GLUCOSE, CAPILLARY
Glucose-Capillary: 105 mg/dL — ABNORMAL HIGH (ref 70–99)
Glucose-Capillary: 76 mg/dL (ref 70–99)
Glucose-Capillary: 78 mg/dL (ref 70–99)
Glucose-Capillary: 96 mg/dL (ref 70–99)
Glucose-Capillary: 126 mg/dL — ABNORMAL HIGH (ref 70–99)
Glucose-Capillary: 217 mg/dL — ABNORMAL HIGH (ref 70–99)
Glucose-Capillary: 157 mg/dL — ABNORMAL HIGH (ref 70–99)

## 2024-01-30 LAB — BASIC METABOLIC PANEL WITH GFR
Anion gap: 11 (ref 5–15)
BUN: 16 mg/dL (ref 8–23)
CO2: 23 mmol/L (ref 22–32)
Calcium: 8.9 mg/dL (ref 8.9–10.3)
Chloride: 102 mmol/L (ref 98–111)
Creatinine, Ser: 0.54 mg/dL — ABNORMAL LOW (ref 0.61–1.24)
GFR, Estimated: >60 mL/min
Glucose, Bld: 77 mg/dL (ref 70–99)
Potassium: 4.3 mmol/L (ref 3.5–5.1)
Sodium: 135 mmol/L (ref 135–145)

## 2024-01-30 LAB — PROTIME-INR
INR: 1.3 — ABNORMAL HIGH (ref 0.8–1.2)
Prothrombin Time: 16.9 s — ABNORMAL HIGH (ref 11.4–15.2)

## 2024-01-30 LAB — MRSA NEXT GEN BY PCR, NASAL: MRSA by PCR Next Gen: NOT DETECTED

## 2024-01-30 LAB — APTT: aPTT: 46 s — ABNORMAL HIGH (ref 24–36)

## 2024-01-30 MED ORDER — ROCURONIUM BROMIDE 10 MG/ML (PF) SYRINGE
PREFILLED_SYRINGE | INTRAVENOUS | Status: AC
  Filled 2024-01-30: qty 20

## 2024-01-30 MED ORDER — ADULT MULTIVITAMIN W/MINERALS CH: 1.0000 | ORAL_TABLET | Freq: Every day | ORAL | Status: DC

## 2024-01-30 MED ORDER — LIDOCAINE 2% (20 MG/ML) 5 ML SYRINGE
INTRAMUSCULAR | Status: DC | PRN
  Administered 2024-01-30: 100 mg via INTRAVENOUS

## 2024-01-30 MED ORDER — POLYETHYLENE GLYCOL 3350 17 G PO PACK: 17.0000 g | PACK | Freq: Every day | ORAL | Status: DC

## 2024-01-30 MED ORDER — NOREPINEPHRINE 4 MG/250ML-% IV SOLN: 0.0000 ug/min | INTRAVENOUS | Status: DC

## 2024-01-30 MED ORDER — VASOPRESSIN 20 UNIT/ML IV SOLN
INTRAVENOUS | Status: DC | PRN
  Administered 2024-01-30 (×3): 1 [IU] via INTRAVENOUS
  Administered 2024-01-30: 2 [IU] via INTRAVENOUS

## 2024-01-30 MED ORDER — VITAMIN D 25 MCG (1000 UNIT) PO TABS: 2000.0000 [IU] | ORAL_TABLET | Freq: Every day | ORAL | Status: DC

## 2024-01-30 MED ORDER — FENTANYL CITRATE (PF) 250 MCG/5ML IJ SOLN
INTRAMUSCULAR | Status: DC | PRN
  Administered 2024-01-30: 50 ug via INTRAVENOUS
  Administered 2024-01-30: 100 ug via INTRAVENOUS

## 2024-01-30 MED ORDER — FUROSEMIDE 10 MG/ML IJ SOLN
40.0000 mg | Freq: Once | INTRAMUSCULAR | Status: AC
  Administered 2024-01-30: 40 mg via INTRAVENOUS
  Filled 2024-01-30: qty 4

## 2024-01-30 MED ORDER — ROCURONIUM BROMIDE 10 MG/ML (PF) SYRINGE
PREFILLED_SYRINGE | INTRAVENOUS | Status: DC | PRN
  Administered 2024-01-30: 50 mg via INTRAVENOUS

## 2024-01-30 MED ORDER — TRAMADOL HCL 50 MG PO TABS
50.0000 mg | ORAL_TABLET | Freq: Four times a day (QID) | ORAL | Status: DC
  Administered 2024-01-30 – 2024-02-06 (×27): 50 mg via ORAL
  Filled 2024-01-30 (×28): qty 1

## 2024-01-30 MED ORDER — FENOFIBRATE 54 MG PO TABS
54.0000 mg | ORAL_TABLET | Freq: Every day | ORAL | Status: DC
  Administered 2024-01-31 – 2024-02-14 (×15): 54 mg via ORAL
  Filled 2024-01-30 (×16): qty 1

## 2024-01-30 MED ORDER — ARFORMOTEROL TARTRATE 15 MCG/2ML IN NEBU
15.0000 ug | INHALATION_SOLUTION | Freq: Two times a day (BID) | RESPIRATORY_TRACT | Status: DC
  Administered 2024-01-30 – 2024-02-14 (×28): 15 ug via RESPIRATORY_TRACT
  Filled 2024-01-30 (×31): qty 2

## 2024-01-30 MED ORDER — INSULIN ASPART 100 UNIT/ML IJ SOLN
0.0000 [IU] | INTRAMUSCULAR | Status: DC
  Administered 2024-01-30: 3 [IU] via SUBCUTANEOUS
  Administered 2024-01-30: 5 [IU] via SUBCUTANEOUS
  Administered 2024-01-30: 2 [IU] via SUBCUTANEOUS
  Filled 2024-01-30: qty 3
  Filled 2024-01-30: qty 2
  Filled 2024-01-30: qty 5

## 2024-01-30 MED ORDER — EPINEPHRINE 1 MG/10ML IV SOSY
PREFILLED_SYRINGE | INTRAVENOUS | Status: AC
  Filled 2024-01-30: qty 10

## 2024-01-30 MED ORDER — ASPIRIN 81 MG PO CHEW
81.0000 mg | CHEWABLE_TABLET | Freq: Every day | ORAL | Status: DC
  Administered 2024-01-31 – 2024-02-04 (×5): 81 mg via ORAL
  Filled 2024-01-30 (×6): qty 1

## 2024-01-30 MED ORDER — BACITRACIN ZINC 500 UNIT/GM EX OINT
TOPICAL_OINTMENT | CUTANEOUS | Status: DC | PRN
  Administered 2024-01-30: 1 via TOPICAL

## 2024-01-30 MED ORDER — HYDROMORPHONE HCL 1 MG/ML IJ SOLN
0.5000 mg | INTRAMUSCULAR | Status: DC | PRN
  Administered 2024-01-30 – 2024-02-01 (×2): 0.5 mg via INTRAVENOUS
  Filled 2024-01-30 (×2): qty 0.5

## 2024-01-30 MED ORDER — SENNA 8.6 MG PO TABS: 1.0000 | ORAL_TABLET | Freq: Two times a day (BID) | ORAL | Status: DC

## 2024-01-30 MED ORDER — ATORVASTATIN CALCIUM 40 MG PO TABS: 40.0000 mg | ORAL_TABLET | Freq: Every day | ORAL | Status: DC

## 2024-01-30 MED ORDER — 0.9 % SODIUM CHLORIDE (POUR BTL) OPTIME
TOPICAL | Status: DC | PRN
  Administered 2024-01-30: 1000 mL

## 2024-01-30 MED ORDER — GABAPENTIN 250 MG/5ML PO SOLN
300.0000 mg | Freq: Two times a day (BID) | ORAL | Status: DC
  Filled 2024-01-30: qty 6

## 2024-01-30 MED ORDER — DEXAMETHASONE SOD PHOSPHATE PF 10 MG/ML IJ SOLN
INTRAMUSCULAR | Status: DC | PRN
  Administered 2024-01-30: 10 mg via INTRAVENOUS

## 2024-01-30 MED ORDER — DEXMEDETOMIDINE HCL IN NACL 400 MCG/100ML IV SOLN
0.0000 ug/kg/h | INTRAVENOUS | Status: DC
  Filled 2024-01-30: qty 100

## 2024-01-30 MED ORDER — FENOFIBRATE 54 MG PO TABS: 54.0000 mg | ORAL_TABLET | Freq: Every day | ORAL | Status: DC

## 2024-01-30 MED ORDER — METHYLPREDNISOLONE SODIUM SUCC 40 MG IJ SOLR
40.0000 mg | Freq: Every day | INTRAMUSCULAR | Status: DC
  Administered 2024-01-30: 40 mg via INTRAVENOUS
  Filled 2024-01-30: qty 1

## 2024-01-30 MED ORDER — LIDOCAINE 2% (20 MG/ML) 5 ML SYRINGE
INTRAMUSCULAR | Status: AC
  Filled 2024-01-30: qty 10

## 2024-01-30 MED ORDER — FENTANYL CITRATE (PF) 100 MCG/2ML IJ SOLN
INTRAMUSCULAR | Status: AC
  Filled 2024-01-30: qty 2

## 2024-01-30 MED ORDER — INSULIN ASPART 100 UNIT/ML IJ SOLN: 0.0000 [IU] | INTRAMUSCULAR | Status: DC

## 2024-01-30 MED ORDER — CEFAZOLIN SODIUM 1 G IJ SOLR
INTRAMUSCULAR | Status: AC
  Filled 2024-01-30: qty 20

## 2024-01-30 MED ORDER — ATORVASTATIN CALCIUM 40 MG PO TABS
40.0000 mg | ORAL_TABLET | Freq: Every day | ORAL | Status: DC
  Administered 2024-01-31 – 2024-02-14 (×15): 40 mg via ORAL
  Filled 2024-01-30 (×15): qty 1

## 2024-01-30 MED ORDER — SENNA 8.6 MG PO TABS
1.0000 | ORAL_TABLET | Freq: Two times a day (BID) | ORAL | Status: DC
  Administered 2024-01-30 – 2024-02-14 (×21): 8.6 mg via ORAL
  Filled 2024-01-30 (×25): qty 1

## 2024-01-30 MED ORDER — ORAL CARE MOUTH RINSE: 15.0000 mL | OROMUCOSAL | Status: DC | PRN

## 2024-01-30 MED ORDER — SODIUM CHLORIDE 0.9 % IV SOLN: 250.0000 mL | INTRAVENOUS | Status: AC

## 2024-01-30 MED ORDER — CHLORHEXIDINE GLUCONATE CLOTH 2 % EX PADS
6.0000 | MEDICATED_PAD | Freq: Every day | CUTANEOUS | Status: DC
  Administered 2024-01-30 – 2024-02-04 (×6): 6 via TOPICAL

## 2024-01-30 MED ORDER — PANTOPRAZOLE SODIUM 40 MG IV SOLR
40.0000 mg | Freq: Every day | INTRAVENOUS | Status: DC
  Administered 2024-01-30 – 2024-02-04 (×6): 40 mg via INTRAVENOUS
  Filled 2024-01-30 (×6): qty 10

## 2024-01-30 MED ORDER — ADULT MULTIVITAMIN W/MINERALS CH
1.0000 | ORAL_TABLET | Freq: Every day | ORAL | Status: DC
  Administered 2024-01-31 – 2024-02-14 (×15): 1 via ORAL
  Filled 2024-01-30 (×15): qty 1

## 2024-01-30 MED ORDER — METOPROLOL SUCCINATE ER 25 MG PO TB24
25.0000 mg | ORAL_TABLET | Freq: Every day | ORAL | Status: DC
  Administered 2024-01-30: 25 mg via ORAL
  Filled 2024-01-30: qty 1

## 2024-01-30 MED ORDER — EPINEPHRINE 1 MG/10ML IV SOSY
PREFILLED_SYRINGE | INTRAVENOUS | Status: DC | PRN
  Administered 2024-01-30: 5 ug via INTRAVENOUS

## 2024-01-30 MED ORDER — GABAPENTIN 300 MG PO CAPS
300.0000 mg | ORAL_CAPSULE | Freq: Two times a day (BID) | ORAL | Status: DC
  Filled 2024-01-30: qty 1

## 2024-01-30 MED ORDER — LORAZEPAM 0.5 MG PO TABS: 0.5000 mg | ORAL_TABLET | Freq: Four times a day (QID) | ORAL | Status: DC | PRN

## 2024-01-30 MED ORDER — FENOFIBRATE 54 MG PO TABS
54.0000 mg | ORAL_TABLET | Freq: Every day | ORAL | Status: DC
  Filled 2024-01-30: qty 1

## 2024-01-30 MED ORDER — VITAMIN D 25 MCG (1000 UNIT) PO TABS
2000.0000 [IU] | ORAL_TABLET | Freq: Every day | ORAL | Status: DC
  Administered 2024-01-31 – 2024-02-14 (×15): 2000 [IU] via ORAL
  Filled 2024-01-30 (×15): qty 2

## 2024-01-30 MED ORDER — ALBUMIN HUMAN 5 % IV SOLN: INTRAVENOUS | Status: DC | PRN

## 2024-01-30 MED ORDER — SODIUM CHLORIDE 0.9 % IV SOLN: INTRAVENOUS | Status: DC | PRN

## 2024-01-30 MED ORDER — TRAMADOL HCL 50 MG PO TABS: 50.0000 mg | ORAL_TABLET | Freq: Four times a day (QID) | ORAL | Status: DC

## 2024-01-30 MED ORDER — GABAPENTIN 100 MG PO CAPS
300.0000 mg | ORAL_CAPSULE | Freq: Two times a day (BID) | ORAL | Status: DC
  Administered 2024-01-30 – 2024-02-14 (×30): 300 mg via ORAL
  Filled 2024-01-30 (×30): qty 3

## 2024-01-30 MED ORDER — PHENYLEPHRINE 80 MCG/ML (10ML) SYRINGE FOR IV PUSH (FOR BLOOD PRESSURE SUPPORT)
PREFILLED_SYRINGE | INTRAVENOUS | Status: DC | PRN
  Administered 2024-01-30: 240 ug via INTRAVENOUS

## 2024-01-30 MED ORDER — SUGAMMADEX SODIUM 200 MG/2ML IV SOLN
INTRAVENOUS | Status: DC | PRN
  Administered 2024-01-30: 200 mg via INTRAVENOUS

## 2024-01-30 MED ORDER — PHENYLEPHRINE 80 MCG/ML (10ML) SYRINGE FOR IV PUSH (FOR BLOOD PRESSURE SUPPORT)
PREFILLED_SYRINGE | INTRAVENOUS | Status: AC
  Filled 2024-01-30: qty 30

## 2024-01-30 MED ORDER — BACITRACIN ZINC 500 UNIT/GM EX OINT
TOPICAL_OINTMENT | CUTANEOUS | Status: AC
  Filled 2024-01-30: qty 28.35

## 2024-01-30 MED ORDER — REVEFENACIN 175 MCG/3ML IN SOLN
175.0000 ug | Freq: Every day | RESPIRATORY_TRACT | Status: DC
  Administered 2024-01-30 – 2024-02-14 (×15): 175 ug via RESPIRATORY_TRACT
  Filled 2024-01-30 (×16): qty 3

## 2024-01-30 MED ORDER — POLYETHYLENE GLYCOL 3350 17 G PO PACK
17.0000 g | PACK | Freq: Every day | ORAL | Status: DC
  Administered 2024-01-31 – 2024-02-14 (×8): 17 g via ORAL
  Filled 2024-01-30 (×13): qty 1

## 2024-01-30 MED ORDER — VASOPRESSIN 20 UNIT/ML IV SOLN
INTRAVENOUS | Status: AC
  Filled 2024-01-30: qty 1

## 2024-01-30 MED ORDER — ASPIRIN 81 MG PO CHEW: 81.0000 mg | CHEWABLE_TABLET | Freq: Every day | ORAL | Status: DC

## 2024-01-30 MED ORDER — NOREPINEPHRINE 4 MG/250ML-% IV SOLN
INTRAVENOUS | Status: AC
  Filled 2024-01-30: qty 250

## 2024-01-30 MED ORDER — IPRATROPIUM BROMIDE 0.06 % NA SOLN
2.0000 | Freq: Three times a day (TID) | NASAL | Status: DC
  Administered 2024-01-30 – 2024-02-14 (×38): 2 via NASAL
  Filled 2024-01-30 (×2): qty 15

## 2024-01-30 MED ORDER — UMECLIDINIUM-VILANTEROL 62.5-25 MCG/ACT IN AEPB
1.0000 | INHALATION_SPRAY | Freq: Every day | RESPIRATORY_TRACT | Status: DC
  Administered 2024-01-30: 1 via RESPIRATORY_TRACT
  Filled 2024-01-30: qty 14

## 2024-01-30 MED ORDER — PROPOFOL 10 MG/ML IV BOLUS
INTRAVENOUS | Status: DC | PRN
  Administered 2024-01-30: 30 mg via INTRAVENOUS
  Administered 2024-01-30: 20 mg via INTRAVENOUS
  Administered 2024-01-30: 50 ug/kg/min via INTRAVENOUS

## 2024-01-30 NOTE — Transfer of Care (Signed)
 Immediate Anesthesia Transfer of Care Note  Patient: Reginald [name]  Procedure(s) Performed: Right above the Knee AMPUTATION. (Right: Leg Upper)  Patient Location: SICU  Anesthesia Type:General  Level of Consciousness: sedated and Patient remains intubated per anesthesia plan  Airway & Oxygen Therapy: Patient remains intubated per anesthesia plan and Patient placed on Ventilator (see vital sign flow sheet for setting)  Post-op Assessment: Report given to RN and Post -op Vital signs reviewed and stable  Post vital signs: Reviewed and stable  Last Vitals:  Vitals Value Taken Time  BP 94/71 1443  Temp    Pulse 92   Resp 16   SpO2 93%     Last Pain:  Vitals:   01/30/24 1213  TempSrc: Oral  PainSc:       Patients Stated Pain Goal: 0 (01/30/24 0750)  Complications: No notable events documented. Bedside report to ICU RN. RT at bedside to manage pts ventilator. VSS.

## 2024-01-30 NOTE — Progress Notes (Deleted)
 OT Cancellation Note  Patient Details Name: Reginald [name] MRN: [id number] DOB: November 02, 1935   Cancelled Treatment:    Reason Eval/Treat Not Completed: Patient at procedure or test/ unavailable. Pt in surgery for BKA.  Donny BECKER OT Acute Rehabilitation Services Office [phone]    [place] Dorothyann Distel 01/30/2024, 1:52 PM

## 2024-01-30 NOTE — Progress Notes (Signed)
 CBG obtained at 9p by NT with a result of 105. Result currently not showing in EPIC since docking.

## 2024-01-30 NOTE — Consult Note (Addendum)
 " Hospital Consult    Reason for Consult: Right lower extremity tissue loss Requesting Physician: Hospital medicine MRN #:  [id number]  History of Present Illness: This is a 89 y.o. male who presents as an outside hospital transfer due to right lower extremity tissue loss.  On exam, Reginald [name] was doing well, accompanied by his wife and son.  His son is an pensions consultant in town.  He and his wife live in [place] and he initially received care at [facility] medical.  He was transferred down to [facility] as the hospital did not offer services necessary-amputation.  Reginald [name] was the [employer] by trade.  He has been retired for a number of years and continues to collect pension.  He has had right lower extremity peripheral arterial disease which has been worked up in [facility] by Dr. Vikki with angiogram demonstrating severe atherosclerotic disease of the common femoral artery.  He did not feel like he was a revascularization candidate, specifically with an EF of 30%, and was going to offered the patient an above-knee amputation at his next visit.  Reginald [name] notes that he has been relatively nonambulatory for quite some time.  He uses a walker at baseline at home and has a ramp already set up.  He continues to live independently with his wife, and wants to continue this.  Denies fevers, chills.  Denied drainage out of the right lower extremity  Past Medical History:  Diagnosis Date   Arthritis    Complication of anesthesia    2014 BP DROPPED S/P BACK SURGERY REC. IV FLUIDS   Diabetes mellitus without complication (HCC)    Disc displacement, lumbar    Full dentures    GERD (gastroesophageal reflux disease)    Hearing deficit    does not wear H/A   History of kidney stones    Hypertension    Kidney stone    Neuromuscular disorder (HCC)    Pneumonia    Umbilical hernia    Wears glasses     Past Surgical History:  Procedure Laterality Date   BACK SURGERY     CATARACT  EXTRACTION W/ INTRAOCULAR LENS  IMPLANT, BILATERAL     COLONOSCOPY W/ BIOPSIES AND POLYPECTOMY     LUMBAR LAMINECTOMY/DECOMPRESSION MICRODISCECTOMY Right 08/30/2016   Procedure: Right lumbar three-four microdiscectomy;  Surgeon: Mavis Purchase, MD;  Location: [facility] OR;  Service: Neurosurgery;  Laterality: Right;   MULTIPLE TOOTH EXTRACTIONS     NOSE SURGERY     deviated septum, and blockage   rottor cuff repair      left     Allergies[1]  Prior to Admission medications  Medication Sig Start Date End Date Taking? Authorizing Provider  albuterol (VENTOLIN HFA) 108 (90 Base) MCG/ACT inhaler Inhale 1 puff into the lungs every 6 (six) hours as needed for wheezing or shortness of breath.   Yes [provider]  aspirin  EC 81 MG tablet Take 81 mg by mouth at bedtime.    Yes [provider]  atorvastatin  (LIPITOR) 40 MG tablet Take 40 mg by mouth daily.   Yes [provider]  B Complex Vitamins (B COMPLEX 100 PO) Take 1 tablet by mouth daily at 2 PM.   Yes [provider]  Cholecalciferol  (VITAMIN D3) 2000 units TABS Take 2,000 Units by mouth daily at 2 PM.   Yes [provider]  empagliflozin (JARDIANCE) 10 MG TABS tablet Take 10 mg by mouth daily.   Yes [provider]  fexofenadine AMES)  180 MG tablet Take 180 mg by mouth daily as needed (for allergies.).    Yes [provider]  furosemide  (LASIX ) 40 MG tablet Take 40 mg by mouth daily.   Yes [provider]  gabapentin  (NEURONTIN ) 300 MG capsule Take 300 mg by mouth 2 (two) times daily.   Yes [provider]  gemfibrozil  (LOPID ) 600 MG tablet Take 600 mg by mouth daily.   Yes [provider]  ipratropium (ATROVENT ) 0.06 % nasal spray Place 2 sprays into both nostrils 3 (three) times daily.   Yes [provider]  levofloxacin (LEVAQUIN) 500 MG tablet Take 500 mg by mouth daily as needed (for urinary discomfort).   Yes [provider]   LORazepam  (ATIVAN ) 0.5 MG tablet Take 0.5 mg by mouth every 6 (six) hours as needed for anxiety.   Yes [provider]  losartan  (COZAAR ) 25 MG tablet Take 25 mg by mouth daily.   Yes [provider]  metFORMIN  (GLUCOPHAGE ) 1000 MG tablet Take 1,000 mg by mouth See admin instructions. 1000 am 500 pm 07/25/16  Yes [provider]  metoprolol  succinate (TOPROL -XL) 25 MG 24 hr tablet Take 25 mg by mouth daily.   Yes [provider]  montelukast  (SINGULAIR ) 10 MG tablet Take 10 mg by mouth daily at 2 PM.    Yes [provider]  Multiple Vitamin (MULTIVITAMIN WITH MINERALS) TABS tablet Take 1 tablet by mouth daily.   Yes [provider]  mycophenolate (CELLCEPT) 500 MG tablet Take 500 mg by mouth 2 (two) times daily.   Yes [provider]  Omega-3 Fatty Acids (FISH OIL) 1000 MG CAPS Take 1,000 mg by mouth every evening.   Yes [provider]  omeprazole (PRILOSEC) 40 MG capsule Take 40 mg by mouth in the morning and at bedtime.   Yes [provider]  predniSONE  (DELTASONE ) 10 MG tablet Take 10 mg by mouth daily with breakfast.   Yes [provider]  sertraline (ZOLOFT) 50 MG tablet Take 50 mg by mouth daily.   Yes [provider]  sodium chloride  (OCEAN) 0.65 % SOLN nasal spray Place 1 spray into both nostrils daily as needed. Saline Mixture patient makes.   Yes [provider]  umeclidinium-vilanterol (ANORO ELLIPTA ) 62.5-25 MCG/ACT AEPB Inhale 1 puff into the lungs daily.   Yes [provider]  vitamin C  (ASCORBIC ACID ) 500 MG tablet Take 500 mg by mouth daily at 2 PM.   Yes [provider]  atorvastatin  (LIPITOR) 10 MG tablet Take 10 mg by mouth daily with lunch. Patient not taking: Reported on 01/29/2024 07/25/16   [provider]    Social History   Socioeconomic History   Marital status: Married    Spouse name: Not on file   Number of children: Not on file    Years of education: Not on file   Highest education level: Not on file  Occupational History   Not on file  Tobacco Use   Smoking status: Former    Types: Cigarettes   Smokeless tobacco: Former    Types: Chew   Tobacco comments:    Quit smoking  in 1987  Vaping Use   Vaping status: Never Used  Substance and Sexual Activity   Alcohol use: No   Drug use: No   Sexual activity: Not on file  Other Topics Concern   Not on file  Social History Narrative   Not on file   Social Drivers of Health  Tobacco Use: Medium Risk (01/29/2024)   Patient History    Smoking Tobacco Use: Former    Smokeless Tobacco Use: Former    Passive Exposure: Not on Actuary Strain: Low Risk (01/02/2024)   Received from [facility]   Overall Financial Resource Strain (CARDIA)    How hard is it for you to pay for the very basics like food, housing, medical care, and heating?: Not hard at all  Food Insecurity: No Food Insecurity (01/29/2024)   Epic    Worried About Programme Researcher, Broadcasting/film/video in the Last Year: Never true    Ran Out of Food in the Last Year: Never true  Transportation Needs: No Transportation Needs (01/29/2024)   Epic    Lack of Transportation (Medical): No    Lack of Transportation (Non-Medical): No  Physical Activity: Insufficiently Active (01/02/2024)   Received from [facility]   Exercise Vital Sign    On average, how many days per week do you engage in moderate to strenuous exercise (like a brisk walk)?: 3 days    On average, how many minutes do you engage in exercise at this level?: 30 min  Stress: No Stress Concern Present (01/02/2024)   Received from [facility] of Occupational Health - Occupational Stress Questionnaire    Do you feel stress - tense, restless, nervous, or anxious, or unable to sleep at night because your mind is troubled all the time - these days?: Not at all  Social Connections: Moderately Integrated (01/29/2024)   Social  Connection and Isolation Panel    Frequency of Communication with Friends and Family: Twice a week    Frequency of Social Gatherings with Friends and Family: Once a week    Attends Religious Services: 1 to 4 times per year    Active Member of [employer] or Organizations: No    Attends Banker Meetings: Never    Marital Status: Married  Catering Manager Violence: Not At Risk (01/29/2024)   Epic    Fear of Current or Ex-Partner: No    Emotionally Abused: No    Physically Abused: No    Sexually Abused: No  Depression (PHQ2-9): Not on file  Alcohol Screen: Not on file  Housing: Low Risk (01/29/2024)   Epic    Unable to Pay for Housing in the Last Year: No    Number of Times Moved in the Last Year: 0    Homeless in the Last Year: No  Utilities: Not At Risk (01/29/2024)   Epic    Threatened with loss of utilities: No  Health Literacy: Low Risk (01/02/2024)   Received from [facility] Literacy    How often do you need to have someone help you when you read instructions, pamphlets, or other written material from your doctor or pharmacy?: Never   Family History  Problem Relation Age of Onset   Stroke Mother    Heart disease Father    Aneurysm Father    Aplastic anemia Sister    Heart disease Other     ROS: Otherwise negative unless mentioned in HPI  Physical Examination  Vitals:   01/30/24 0750 01/30/24 0935  BP: 135/80 117/74  Pulse: 76 (!) 101  Resp: 20   Temp: 97.8 F (36.6 C)   SpO2: 99%    Body mass index is 29.83 kg/m.  General:  WDWN in NAD Gait: Not observed HENT: WNL, normocephalic Pulmonary: normal non-labored breathing,  without Rales, rhonchi,  wheezing Cardiac: regular Abdomen:  soft, NT/ND, no masses Skin:  no rashes, erythema in the right leg stable, mid calf. Vascular Exam/Pulses: Monophasic signals in the posterior tibial artery, multiphasic popliteal artery signal. Extremities: without ischemic changes, without Gangrene , without  cellulitis; without open wounds;  Musculoskeletal: no muscle wasting or atrophy  Neurologic: A&O X 3;  No focal weakness or paresthesias are detected; speech is fluent/normal Psychiatric:  The pt has Normal affect. Lymph:  Unremarkable  CBC    Component Value Date/Time   WBC 13.1 (H) 01/30/2024 0340   RBC 4.25 01/30/2024 0340   HGB 11.0 (L) 01/30/2024 0340   HCT 36.2 (L) 01/30/2024 0340   PLT 269 01/30/2024 0340   MCV 85.2 01/30/2024 0340   MCH 25.9 (L) 01/30/2024 0340   MCHC 30.4 01/30/2024 0340   RDW 16.7 (H) 01/30/2024 0340    BMET    Component Value Date/Time   NA 135 01/30/2024 0340   K 4.3 01/30/2024 0340   CL 102 01/30/2024 0340   CO2 23 01/30/2024 0340   GLUCOSE 77 01/30/2024 0340   BUN 16 01/30/2024 0340   CREATININE 0.54 (L) 01/30/2024 0340   CALCIUM  8.9 01/30/2024 0340   GFRNONAA >60 01/30/2024 0340   GFRAA >60 08/30/2016 1346    COAGS: Lab Results  Component Value Date   INR 1.3 (H) 01/30/2024   INR 1.3 (H) 01/29/2024     ASSESSMENT/PLAN: This is a 89 y.o. male with critical limb ischemia of the right lower extremity with severe tissue loss involving the entirety of the forefoot.  He has both wet and dry gangrene with cellulitis appreciated to the mid calf.  Cellulitis has not worsened, in fact it has lessened with the use of antibiotics.  I had a long discussion with Reginald [name], his wife, and his son Reginald [name] regarding his lower extremity tissue loss.  We discussed options moving forward which included comfort care measures, high risk below-knee amputation, above-knee amputation.  He is aware that he would likely not ambulate anymore with a below-knee amputation and then an above-knee amputation.  Furthermore, below-knee amputation comes with a high risk of nonhealing, high risk for further procedures, and would likely contracted he is not using it.  I was very honest that I think Reginald [name] would be best served with above-knee amputation.  After discussing the risks  and benefits in detail, Reginald [name] elected to proceed.     Fonda FORBES Rim MD MS [facility] [phone] 01/30/2024  10:21 AM    I agree with Dr Rim. We discussed above the knee is his best option. The patient and his family agree to proceed. Norman GORMAN Serve MD [facility] Phone Number: [phone] 01/30/2024 12:17 PM      [1]  Allergies Allergen Reactions   Naproxen Itching and Rash   Roxicet [Oxycodone-Acetaminophen ] Itching and Rash   "

## 2024-01-30 NOTE — Anesthesia Postprocedure Evaluation (Signed)
"   Anesthesia Post Note  Patient: Reginald [name]  Procedure(s) Performed: Right above the Knee AMPUTATION. (Right: Leg Upper)     Patient location during evaluation: SICU Anesthesia Type: General Level of consciousness: sedated Pain management: pain level controlled Vital Signs Assessment: post-procedure vital signs reviewed and stable Respiratory status: patient remains intubated per anesthesia plan Cardiovascular status: stable Postop Assessment: no apparent nausea or vomiting Anesthetic complications: no   No notable events documented.  Last Vitals:  Vitals:   01/30/24 1237 01/30/24 1440  BP:    Pulse: 82 74  Resp: 18 16  Temp:    SpO2: 92% 92%    Last Pain:  Vitals:   01/30/24 1213  TempSrc: Oral  PainSc:                  Thom JONELLE Peoples      "

## 2024-01-30 NOTE — Progress Notes (Signed)
 Pt awake. VTs excellent Staff Attempted to get ABG but unable.  He is awake. A little agitated.  I think best to extubate to heated high flow other wise we will have to re-sedate Plan Extubate to heated high flow F/u VBG Wean for sats > 88%

## 2024-01-30 NOTE — Anesthesia Preprocedure Evaluation (Signed)
"                                    Anesthesia Evaluation  Patient identified by MRN, date of birth, ID band Patient awake    Reviewed: Allergy & Precautions, H&P , NPO status , Patient's Chart, lab work & pertinent test results  History of Anesthesia Complications Negative for: history of anesthetic complications  Airway Mallampati: III  TM Distance: >3 FB Neck ROM: Full    Dental no notable dental hx.    Pulmonary former smoker Interstitial lung disease On home oxygen  Currently requiring 5L HFNC    Pulmonary exam normal breath sounds clear to auscultation       Cardiovascular hypertension, (-) angina + CAD, + CABG, + Peripheral Vascular Disease and +CHF  (-) Past MI Normal cardiovascular exam Rhythm:Regular Rate:Normal  LVEF 25-30%   Neuro/Psych neg Seizures  negative psych ROS   GI/Hepatic Neg liver ROS,GERD  ,,  Endo/Other  diabetes, Type 2    Renal/GU negative Renal ROS  negative genitourinary   Musculoskeletal negative musculoskeletal ROS (+)    Abdominal   Peds negative pediatric ROS (+)  Hematology  (+) Blood dyscrasia, anemia   Anesthesia Other Findings   Reproductive/Obstetrics negative OB ROS                              Anesthesia Physical Anesthesia Plan  ASA: 3  Anesthesia Plan: General   Post-op Pain Management:    Induction: Intravenous  PONV Risk Score and Plan: 2 and Ondansetron  and Dexamethasone   Airway Management Planned: Oral ETT  Additional Equipment: None  Intra-op Plan:   Post-operative Plan: Possible Post-op intubation/ventilation  Informed Consent: I have reviewed the patients History and Physical, chart, labs and discussed the procedure including the risks, benefits and alternatives for the proposed anesthesia with the patient or authorized representative who has indicated his/her understanding and acceptance.     Dental advisory given  Plan Discussed with:  CRNA  Anesthesia Plan Comments:          Anesthesia Quick Evaluation  "

## 2024-01-30 NOTE — Anesthesia Procedure Notes (Signed)
 Procedure Name: Intubation Date/Time: 01/30/2024 1:14 PM  Performed by: Delores Duwaine SAUNDERS, CRNAPre-anesthesia Checklist: Patient identified, Emergency Drugs available, Suction available and Patient being monitored Patient Re-evaluated:Patient Re-evaluated prior to induction Oxygen Delivery Method: Circle System Utilized Preoxygenation: Pre-oxygenation with 100% oxygen Induction Type: IV induction Ventilation: Mask ventilation without difficulty Laryngoscope Size: Mac and 4 Grade View: Grade I Tube type: Oral Tube size: 7.5 mm Number of attempts: 1 Airway Equipment and Method: Stylet and Oral airway Placement Confirmation: ETT inserted through vocal cords under direct vision, positive ETCO2 and breath sounds checked- equal and bilateral Secured at: 23 cm Tube secured with: Tape Dental Injury: Teeth and Oropharynx as per pre-operative assessment

## 2024-01-30 NOTE — Consult Note (Addendum)
 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  Presented on 1/12 with worsening pain and discoloration of the right foot white blood cell count was 17.1 with a lactate of 2.2 ESR was 22.  He was seen by vascular surgery on 1/13 who felt he had critical limb ischemia of the right lower extremity with severe tissue loss of the entire forefoot, with cellulitis to the level of the mid calf.  This is not improved with antibiotics so the decision for below the knee amputation was made.  He was brought to the operating room on 1/13 Operative reports: Minimal EBL Brief episode of hypotension after induction When more tachycardic BP more labile   Pertinent  Medical History  Type 2 DM, prior CABG,ANA positive IPAF w/ chronic resp failure on mycophenolate for immunomodulation (followed at [facility]) HFrEF 25-30% severe PAD w/ gangrenous right foot  Significant Hospital Events: Including procedures, antibiotic start and stop dates in addition to other pertinent events   1/12 admitted with gangrenous right foot in spite of outpatient antibiotics 1/13 right BKA.  Transferred to the intensive care postoperatively on mechanical ventilation, anesthesia unable to wean FiO2  Interim History / Subjective:  Currently sedated and on propofol   Objective    Blood pressure 113/77, pulse 82, temperature 97.7 F (36.5 C), temperature source Oral, resp. rate 18, height 5' 8 (1.727 m), weight 89 kg, SpO2 92%.        Intake/Output Summary (Last 24  hours) at 01/30/2024 1429 Last data filed at 01/30/2024 1417 Gross per 24 hour  Intake 562.57 ml  Output 600 ml  Net -37.43 ml   Filed Weights   01/29/24 1944 01/30/24 0500  Weight: 88 kg 89 kg    Examination: General: 89 year old male patient currently sedated on propofol  HENT: Normocephalic atraumatic orally intubated Lungs: Crackles both bases, currently on 8 cc/kg predicted tidal volume, Plateau pressure 20 Portable chest x-ray personally reviewed:His endotracheal tube is in satisfactory position, chronic bilateral airspace disease Cardiovascular: Regular rhythm without murmur rub or gallop Abdomen: Soft nontender no organomegaly Extremities: Left lower extremity is warm and dry with brisk cap refill, the right BKA is wrapped without drainage at this point Neuro: Sedated GU: Due to void  Resolved problem list   Assessment and Plan   Gangrenous right lower extremity with osteomyelitis, cellulitis and critical limb ischemia, now status post right BKA Plan Follow up postoperative CBC Follow-up INR and fibrinogen Did not receive blood products, but will check ionized calcium  with ABG As needed analgesia  continuing IV vancomycin  and cefepime  Postoperative antiplatelet therapy and or anticoagulation at the discretion of surgical team  Ventilator management complicated by chronic hypoxic respiratory failure in the setting of 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 - Baseline reported to be on 5 L/min.  On CellCept and prednisone  at home - He has required up to 10 L since hospital admission.  Is not clear to me what his baseline is or if he typically requires more than 5 L or should be  on more than 5 L Plan Continuing full ventilator support 8 cc/kg Wean FiO2 for oxygen saturation goal greater than 88% Follow-up postoperative ABG and chest x-ray Holding CellCept with active infection for now Typically on prednisone  10 mg a day, will pulse to 40mg   daily for now  Will substitute his home bronchodilator therapy with Brovana  and Yupelri   History of coronary artery disease with prior CABG, heart failure w/ reduced ejection fraction  EF 25 to 30% at baseline Plan Telemetry monitoring Holding Lasix , losartan  and beta-blocker for now Ensure mean arterial pressure greater than 65  twelve-lead EKG Consider repeat echocardiogram if needs pressors Norepinephrine  for mean arterial pressure greater than 65  Intermittent AF Plan Tele Rate control   Type 2 diabetes with diabetic neuropathy on metformin  and Jardiance Plan Hold Jardiance and metformin  Sliding scale insulin  Resume Neurontin  via tube or orally  Hyperlipidemia Plan Resume statin and Lopid  when able  History of depression Plan Resume sertraline 50 mg daily  Labs   CBC: Recent Labs  Lab 01/29/24 1849 01/30/24 0340  WBC 15.2* 13.1*  HGB 11.1* 11.0*  HCT 37.8* 36.2*  MCV 86.1 85.2  PLT 268 269    Basic Metabolic Panel: Recent Labs  Lab 01/29/24 1849 01/30/24 0340  NA 137 135  K 4.2 4.3  CL 100 102  CO2 24 23  GLUCOSE 81 77  BUN 15 16  CREATININE 0.67 0.54*  CALCIUM  8.9 8.9  MG 1.7  --   PHOS 3.7  --    GFR: Estimated Creatinine Clearance: 71.8 mL/min (A) (by C-G formula based on SCr of 0.54 mg/dL (L)). Recent Labs  Lab 01/29/24 1849 01/30/24 0340  WBC 15.2* 13.1*    Liver Function Tests: No results for input(s): AST, ALT, ALKPHOS, BILITOT, PROT, ALBUMIN  in the last 168 hours. No results for input(s): LIPASE, AMYLASE in the last 168 hours. No results for input(s): AMMONIA in the last 168 hours.  ABG No results found for: PHART, PCO2ART, PO2ART, HCO3, TCO2, ACIDBASEDEF, O2SAT   Coagulation Profile: Recent Labs  Lab 01/29/24 1849 01/30/24 0340  INR 1.3* 1.3*    Cardiac Enzymes: No results for input(s): CKTOTAL, CKMB, CKMBINDEX, TROPONINI in the last 168 hours.  HbA1C: Hgb A1c MFr Bld   Date/Time Value Ref Range Status  01/29/2024 06:49 PM 6.8 (H) 4.8 - 5.6 % Final    Comment:    (NOTE) Diagnosis of Diabetes The following HbA1c ranges recommended by the American Diabetes Association (ADA) may be used as an aid in the diagnosis of diabetes mellitus.  Hemoglobin             Suggested A1C NGSP%              Diagnosis  <5.7                   Non Diabetic  5.7-6.4                Pre-Diabetic  >6.4                   Diabetic  <7.0                   Glycemic control for                       adults with diabetes.    08/30/2016 08:28 PM 6.8 (H) 4.8 - 5.6 % Final    Comment:    (NOTE)  Prediabetes: 5.7 - 6.4         Diabetes: >6.4         Glycemic control for adults with diabetes: <7.0     CBG: Recent Labs  Lab 01/29/24 1646 01/29/24 2117 01/30/24 0618 01/30/24 1211  GLUCAP 75 105* 76 78    Review of Systems:   Not able   Past Medical History:  He,  has a past medical history of Arthritis, Complication of anesthesia, Diabetes mellitus without complication (HCC), Disc displacement, lumbar, Full dentures, GERD (gastroesophageal reflux disease), Hearing deficit, History of kidney stones, Hypertension, Kidney stone, Neuromuscular disorder (HCC), Pneumonia, Umbilical hernia, and Wears glasses.   Surgical History:   Past Surgical History:  Procedure Laterality Date   BACK SURGERY     CATARACT EXTRACTION W/ INTRAOCULAR LENS  IMPLANT, BILATERAL     COLONOSCOPY W/ BIOPSIES AND POLYPECTOMY     LUMBAR LAMINECTOMY/DECOMPRESSION MICRODISCECTOMY Right 08/30/2016   Procedure: Right lumbar three-four microdiscectomy;  Surgeon: Mavis Purchase, MD;  Location: [facility] OR;  Service: Neurosurgery;  Laterality: Right;   MULTIPLE TOOTH EXTRACTIONS     NOSE SURGERY     deviated septum, and blockage   rottor cuff repair      left      Social History:   reports that he has quit smoking. His smoking use included cigarettes. He has quit using smokeless tobacco.  His  smokeless tobacco use included chew. He reports that he does not drink alcohol and does not use drugs.   Family History:  His family history includes Aneurysm in his father; Aplastic anemia in his sister; Heart disease in his father and another family member; Stroke in his mother.   Allergies Allergies[1]   Home Medications  Prior to Admission medications  Medication Sig Start Date End Date Taking? Authorizing Provider  albuterol (VENTOLIN HFA) 108 (90 Base) MCG/ACT inhaler Inhale 1 puff into the lungs every 6 (six) hours as needed for wheezing or shortness of breath.   Yes [provider]  aspirin  EC 81 MG tablet Take 81 mg by mouth at bedtime.    Yes [provider]  atorvastatin  (LIPITOR) 40 MG tablet Take 40 mg by mouth daily.   Yes [provider]  B Complex Vitamins (B COMPLEX 100 PO) Take 1 tablet by mouth daily at 2 PM.   Yes [provider]  Cholecalciferol  (VITAMIN D3) 2000 units TABS Take 2,000 Units by mouth daily at 2 PM.   Yes [provider]  empagliflozin (JARDIANCE) 10 MG TABS tablet Take 10 mg by mouth daily.   Yes [provider]  fexofenadine (ALLEGRA) 180 MG tablet Take 180 mg by mouth daily as needed (for allergies.).    Yes [provider]  furosemide  (LASIX ) 40 MG tablet Take 40 mg by mouth daily.   Yes [provider]  gabapentin  (NEURONTIN ) 300 MG capsule Take 300 mg by mouth 2 (two) times daily.   Yes [provider]  gemfibrozil  (LOPID ) 600 MG tablet Take 600 mg by mouth daily.   Yes [provider]  ipratropium (ATROVENT ) 0.06 % nasal spray Place 2 sprays into both nostrils 3 (three) times daily.   Yes [provider]  levofloxacin (LEVAQUIN) 500 MG tablet Take 500 mg by mouth daily as needed (for urinary discomfort).   Yes [provider]  LORazepam  (ATIVAN ) 0.5 MG tablet Take 0.5 mg by mouth every 6 (six) hours as needed for anxiety.   Yes [provider]  losartan  (COZAAR ) 25 MG tablet Take 25 mg by mouth daily.   Yes [provider]  metFORMIN  (GLUCOPHAGE ) 1000 MG tablet Take 1,000 mg by mouth See admin instructions. 1000 am 500 pm 07/25/16  Yes [provider]  metoprolol  succinate (TOPROL -XL) 25 MG 24 hr tablet Take 25 mg by mouth daily.   Yes [provider]  montelukast  (SINGULAIR ) 10 MG tablet Take 10 mg by mouth daily at 2 PM.    Yes [provider]  Multiple Vitamin (MULTIVITAMIN WITH MINERALS) TABS tablet Take 1 tablet by mouth daily.   Yes [provider]  mycophenolate (CELLCEPT) 500 MG tablet Take 500 mg by mouth 2 (two) times daily.   Yes [provider]  Omega-3 Fatty Acids (FISH OIL) 1000 MG CAPS Take 1,000 mg by mouth every evening.   Yes [provider]  omeprazole (PRILOSEC) 40 MG capsule Take 40 mg by mouth in the morning and at bedtime.   Yes [provider]  predniSONE  (DELTASONE ) 10 MG tablet Take 10 mg by mouth daily with breakfast.   Yes [provider]  sertraline (ZOLOFT) 50 MG tablet Take 50 mg by mouth daily.   Yes [provider]  sodium chloride  (OCEAN) 0.65 % SOLN nasal spray Place 1 spray into both nostrils daily as needed. Saline Mixture patient makes.   Yes [provider]  umeclidinium-vilanterol (ANORO ELLIPTA ) 62.5-25 MCG/ACT AEPB Inhale 1 puff into the lungs daily.   Yes [provider]  vitamin C  (ASCORBIC ACID ) 500 MG tablet Take 500 mg by mouth daily at 2 PM.   Yes [provider]  atorvastatin  (LIPITOR) 10 MG tablet Take 10 mg by mouth daily with lunch. Patient not taking: Reported on 01/29/2024 07/25/16   [provider]      I personally  spent 32 minutes  on this patient which included: review of medical records, nursing notes, progress notes, evaluation, interpretation of lab data and diagnostic studies, taking independent history, performing exam, documenting plan,  ordering diagnostics and interventions for the following critical care issues: Acute respiratory failure with the following interventions which included: prevention of further deterioration                 [1]  Allergies Allergen Reactions   Naproxen Itching and Rash   Roxicet [Oxycodone-Acetaminophen ] Itching and Rash   "

## 2024-01-30 NOTE — Progress Notes (Signed)
 PT Cancellation Note  Patient Details Name: Reginald [name] MRN: [id number] DOB: October 11, 1935   Cancelled Treatment:    Reason Eval/Treat Not Completed: (P) Patient not medically ready Pt scheduled for BKA this morning. PT will await Eval orders after sx.  [name] B. [name] PT, DPT Acute Rehabilitation Services Please use secure chat or  Call Office [phone]   Almarie KATHEE [facility]eta [facility] 01/30/2024, 8:28 AM

## 2024-01-30 NOTE — Op Note (Signed)
" ° ° °  OPERATIVE NOTE  PROCEDURE:   right above knee amputation, transfemoral  PRE-OPERATIVE DIAGNOSIS: Chronic limb threatening ischemia of right lower extremity with gangrene of the toes  POST-OPERATIVE DIAGNOSIS: same as above   SURGEON: Norman GORMAN Serve MD  ASSISTANT(S): Sherrilee Holster, [place]  Given the complexity of the case a first assistant was necessary in order to expedient the procedure and safely perform the technical aspects of the operation.   ANESTHESIA: general  ESTIMATED BLOOD LOSS: 200 cc  FINDING(S): Healthy musculature of the right thigh, reactive to electrocautery.  Healthy bleeding with multiple intramuscular collaterals.  Severely calcified and likely occluded right above-knee popliteal  SPECIMEN(S): Right leg  INDICATIONS:   Reginald [name] is a 89 y.o. male with chronic limb threatening ischemia and toe wounds that are progressed to dry gangrene.  He was transferred in from an outside facility due to severe peripheral arterial disease and given his medical comorbidities he is not a revascularization candidate.  Risks and benefits of above-knee amputation were reviewed and he and his family elected to proceed.  DESCRIPTION: The patient was brought to the OR and positioned supine on the OR table.  General anesthesia was induced and the right leg was prepped and draped in standard fashion.  A timeout was performed and preoperative antibiotics were administered.  A sterile tourniquet was applied to the proximal thigh. An esmarch tourniquet was used to exsanguinate the leg and the tourniquet inflated. A fish-mouth incision line was drawn about 3cm proximal from the patella for the right above-knee amputation. Sharp dissection and electrocautery was used to the femur.  Healthy bleeding muscle was encountered.  Thigh muscles were divided and femoral artery and vein identified.  These were double ligated with a 0 silk tie and a 2-0 suture ligature.  The periosteum was raised from  the femur proximally and a reciprocating saw was used to divide the femur 4-5cm proximal to the skin incision. An amputation knife was used to divide the posterior muscles and complete the amputation. A rasp was used to round the edges of the femur. Bleeding vessels were oversewn using 2-0 silk suture. The sciatic nerve was identified, pulled to length and ligated with a 2-0 silk tie. Once hemostasis was achieved, warm saline was brought to the field and the wound bed was irrigated. There was no bleeding from the marrow.  The fascial layer was closed with interrupted 2-0 Vicryl pop suture along the entirety of the incision. The skin was closed with staples.  An occlusive dressing was placed and the patient was taken the PACU in stable condition.    COMPLICATIONS: None apparent  CONDITION: Critical, transferring to ICU due to difficulty with oxygenation.  He was on high flow nasal cannula prior to intubation and during the case there continued to be issues with oxygenation.  Norman GORMAN Serve MD [facility] Phone Number: [phone] 01/30/2024 2:30 PM       "

## 2024-01-30 NOTE — Progress Notes (Signed)
 " PROGRESS NOTE  COALTON ARCH FMW:[id number] DOB: May 20, 1935 DOA: 01/29/2024 PCP: System, Provider Not In   LOS: 1 day   Brief narrative:  Patient is a 89 years old male with history of type 2 diabetes, coronary artery disease status post CABG, interstitial lung disease with chronic hypoxic respiratory failure on 5 L of oxygen at baseline, chronic systolic heart failure with reduced ejection fraction with last known ejection fraction of 25 to 30% and severe peripheral artery disease with right foot gangrene who has recently completed antibiotics for right foot infection 3 weeks back presented to the hospital with worsening pain and redness of the right foot.  Patient has noticed worsening discoloration of the toes and pain was constantly present even at rest.  He was initially at the outside hospital and had labs drawn which showed WBC at 17.1 with lactate of 2.2 with ESR of 22 and CRP of 15.2.  X-ray of the foot did not show any acute abnormality.  Patient was given IV vancomycin  and Zosyn.  Vascular surgery Dr. Silver was was reached out from the outside ED and was transferred to [facility] for further evaluation and treatment.       Assessment/Plan: Principal Problem:   Foot infection Active Problems:   Gangrene of right foot (HCC)   DM II (diabetes mellitus, type II), controlled (HCC)   HFrEF (heart failure with reduced ejection fraction) (HCC)   Mild right foot cellulitis with ischemic gangrene of forefoot secondary to Severe peripheral vascular disease   Vascular surgery Dr. Lanis has seen the patient and plan for above-knee amputation today.  Continue NPO.  Continue IV antibiotics analgesia.  WBC at 15.2 will continue to monitor.   CAD status post CABG.  patient  is on Lipitor aspirin ,  Gemfibrozil ,  Losartan  and metoprolol  from home.  Will change gemfibrozil  to fenofibrate  due to high chances of rhabdomyolysis with Lipitor.   Interstitial lung disease with chronic hypoxic  respiratory failure.   Taking CellCept as outpatient.  Will hold due to coexistent infection.  On high flow nasal cannula at 10 L/min.  At baseline patient uses around 5 L of oxygen.  Appears to be comfortable at this time.  Follows up with pulmonary as outpatient.   Diabetes mellitus type 2.   On metformin  and Jardiance as outpatient.  Hold oral hypoglycemic agents.  Continue with sliding scale insulin  while in the hospital.  Hemoglobin A1c of 6.8 on 01/29/2024.  Chronic systolic CHF.  Will hold Jardiance for now.  Will continue to monitor intake and output charting Daily weights.  No signs of overt decompensation.  Received gentle fluids after midnight for surgical intervention today.  Hold ACE inhibitors for now.    DVT prophylaxis: heparin  injection 5,000 Units Start: 01/29/24 1730 SCDs Start: 01/29/24 1628   Disposition: Might need rehabilitation.  Status is: Inpatient Remains inpatient appropriate because: Need for surgical intervention    Code Status:     Code Status: Full Code  Family Communication: Spoke with the patient's spouse and son at bedside  Consultants: Vascular surgery  Procedures: None yet  Anti-infectives:  Vancomycin  and cefepime   Anti-infectives (From admission, onward)    Start     Dose/Rate Route Frequency Ordered Stop   01/30/24 1000  vancomycin  (VANCOREADY) IVPB 1500 mg/300 mL        1,500 mg 150 mL/hr over 120 Minutes Intravenous Every 24 hours 01/29/24 2001     01/29/24 2100  ceFEPIme  (MAXIPIME ) 2 g in sodium  chloride 0.9 % 100 mL IVPB        2 g 200 mL/hr over 30 Minutes Intravenous Every 8 hours 01/29/24 2001          Subjective: Today, patient was seen and examined at bedside.  Complains of controlled pain at this time.  Had a good night.  Patient's family at bedside.  Denies any dyspnea cough congestion chest pain fever or chills.  Objective: Vitals:   01/30/24 0750 01/30/24 0935  BP: 135/80 117/74  Pulse: 76 (!) 101  Resp: 20    Temp: 97.8 F (36.6 C)   SpO2: 99%     Intake/Output Summary (Last 24 hours) at 01/30/2024 1049 Last data filed at 01/30/2024 0631 Gross per 24 hour  Intake 312.57 ml  Output 600 ml  Net -287.43 ml   Filed Weights   01/29/24 1944 01/30/24 0500  Weight: 88 kg 89 kg   Body mass index is 29.83 kg/m.   Physical Exam:  GENERAL: Patient is alert awake and oriented. Not in obvious distress.  Elderly male, on nasal cannula oxygen, HENT: No scleral pallor or icterus. Pupils equally reactive to light. Oral mucosa is moist NECK: is supple, no gross swelling noted. CHEST: Coarse breath sounds noted with crackles. CVS: S1 and S2 heard, no murmur. Regular rate and rhythm.  ABDOMEN: Soft, non-tender, bowel sounds are present. EXTREMITIES: Right foot with ischemic gangrene. CNS: Cranial nerves are intact.  Able to move extremities, SKIN: Right foot with ischemic gangrene.  Data Review: I have personally reviewed the following laboratory data and studies,  CBC: Recent Labs  Lab 01/29/24 1849 01/30/24 0340  WBC 15.2* 13.1*  HGB 11.1* 11.0*  HCT 37.8* 36.2*  MCV 86.1 85.2  PLT 268 269   Basic Metabolic Panel: Recent Labs  Lab 01/29/24 1849 01/30/24 0340  NA 137 135  K 4.2 4.3  CL 100 102  CO2 24 23  GLUCOSE 81 77  BUN 15 16  CREATININE 0.67 0.54*  CALCIUM  8.9 8.9  MG 1.7  --   PHOS 3.7  --    Liver Function Tests: No results for input(s): AST, ALT, ALKPHOS, BILITOT, PROT, ALBUMIN  in the last 168 hours. No results for input(s): LIPASE, AMYLASE in the last 168 hours. No results for input(s): AMMONIA in the last 168 hours. Cardiac Enzymes: No results for input(s): CKTOTAL, CKMB, CKMBINDEX, TROPONINI in the last 168 hours. BNP (last 3 results) No results for input(s): BNP in the last 8760 hours.  ProBNP (last 3 results) No results for input(s): PROBNP in the last 8760 hours.  CBG: Recent Labs  Lab 01/29/24 1646 01/29/24 2117  01/30/24 0618  GLUCAP 75 105* 76   No results found for this or any previous visit (from the past 240 hours).   Studies: No results found.    Chae Oommen, MD  [facility] 01/30/2024  If 7PM-7AM, please contact night-coverage   "

## 2024-01-30 NOTE — Procedures (Signed)
 Extubation Procedure Note  Patient Details:   Name: Reginald [name] DOB: 1935-08-13 MRN: [id number]   Airway Documentation:    Vent end date: 01/30/24 Vent end time: 1620   Evaluation  O2 sats: stable throughout Complications: No apparent complications Patient did tolerate procedure well. Bilateral Breath Sounds: Clear, Diminished   Yes  Patient extubated to heated high flow nasal cannula per MD order.  Positive cuff leak noted.  No evidence of stridor.  Patient able to speak post extubation.  Sats and vitals are stable.  No complications noted.    Leotis KATHEE Benders 01/30/2024, 4:25 PM

## 2024-01-30 NOTE — Progress Notes (Signed)
 OT Cancellation Note  Patient Details Name: TYRAN HUSER MRN: [id number] DOB: 04-22-1935   Cancelled Treatment:    Reason Eval/Treat Not Completed: Other (comment) (Pt planning to go to BKA sx today will follow up post op. Thank you.)  Mikail Goostree K OTR/L  Acute Rehab Services  [phone] office number   Warrick Berber 01/30/2024, 9:00 AM

## 2024-01-31 ENCOUNTER — Encounter (HOSPITAL_COMMUNITY): Payer: Self-pay | Admitting: Vascular Surgery

## 2024-01-31 DIAGNOSIS — I5033 Acute on chronic diastolic (congestive) heart failure: Secondary | ICD-10-CM

## 2024-01-31 DIAGNOSIS — J9621 Acute and chronic respiratory failure with hypoxia: Secondary | ICD-10-CM

## 2024-01-31 DIAGNOSIS — K219 Gastro-esophageal reflux disease without esophagitis: Secondary | ICD-10-CM

## 2024-01-31 DIAGNOSIS — Z89611 Acquired absence of right leg above knee: Secondary | ICD-10-CM

## 2024-01-31 DIAGNOSIS — Z4889 Encounter for other specified surgical aftercare: Secondary | ICD-10-CM

## 2024-01-31 DIAGNOSIS — J849 Interstitial pulmonary disease, unspecified: Secondary | ICD-10-CM

## 2024-01-31 LAB — GLUCOSE, CAPILLARY
Glucose-Capillary: 102 mg/dL — ABNORMAL HIGH (ref 70–99)
Glucose-Capillary: 230 mg/dL — ABNORMAL HIGH (ref 70–99)
Glucose-Capillary: 219 mg/dL — ABNORMAL HIGH (ref 70–99)
Glucose-Capillary: 339 mg/dL — ABNORMAL HIGH (ref 70–99)
Glucose-Capillary: 291 mg/dL — ABNORMAL HIGH (ref 70–99)
Glucose-Capillary: 294 mg/dL — ABNORMAL HIGH (ref 70–99)

## 2024-01-31 LAB — BASIC METABOLIC PANEL WITH GFR
Anion gap: 11 (ref 5–15)
BUN: 21 mg/dL (ref 8–23)
CO2: 21 mmol/L — ABNORMAL LOW (ref 22–32)
Calcium: 8.7 mg/dL — ABNORMAL LOW (ref 8.9–10.3)
Chloride: 104 mmol/L (ref 98–111)
Creatinine, Ser: 0.54 mg/dL — ABNORMAL LOW (ref 0.61–1.24)
GFR, Estimated: >60 mL/min
Glucose, Bld: 125 mg/dL — ABNORMAL HIGH (ref 70–99)
Potassium: 4.1 mmol/L (ref 3.5–5.1)
Sodium: 137 mmol/L (ref 135–145)

## 2024-01-31 LAB — CBC
HCT: 34 % — ABNORMAL LOW (ref 39.0–52.0)
Hemoglobin: 10.2 g/dL — ABNORMAL LOW (ref 13.0–17.0)
MCH: 25.8 pg — ABNORMAL LOW (ref 26.0–34.0)
MCHC: 30 g/dL (ref 30.0–36.0)
MCV: 85.9 fL (ref 80.0–100.0)
Platelets: 236 K/uL (ref 150–400)
RBC: 3.96 MIL/uL — ABNORMAL LOW (ref 4.22–5.81)
RDW: 16.4 % — ABNORMAL HIGH (ref 11.5–15.5)
WBC: 11.3 K/uL — ABNORMAL HIGH (ref 4.0–10.5)
nRBC: 0 % (ref 0.0–0.2)

## 2024-01-31 LAB — MAGNESIUM: Magnesium: 1.9 mg/dL (ref 1.7–2.4)

## 2024-01-31 MED ORDER — PREDNISONE 20 MG PO TABS
40.0000 mg | ORAL_TABLET | Freq: Every day | ORAL | Status: DC
  Administered 2024-01-31: 40 mg via ORAL
  Filled 2024-01-31 (×2): qty 2

## 2024-01-31 MED ORDER — FUROSEMIDE 10 MG/ML IJ SOLN
40.0000 mg | Freq: Once | INTRAMUSCULAR | Status: AC
  Administered 2024-01-31: 40 mg via INTRAVENOUS
  Filled 2024-01-31: qty 4

## 2024-01-31 MED ORDER — INSULIN ASPART 100 UNIT/ML IJ SOLN
3.0000 [IU] | INTRAMUSCULAR | Status: DC
  Administered 2024-01-31: 9 [IU] via SUBCUTANEOUS
  Administered 2024-02-01: 6 [IU] via SUBCUTANEOUS
  Administered 2024-02-01 (×3): 3 [IU] via SUBCUTANEOUS
  Filled 2024-01-31 (×4): qty 1
  Filled 2024-01-31: qty 9
  Filled 2024-01-31: qty 6

## 2024-01-31 MED ORDER — INSULIN ASPART 100 UNIT/ML IJ SOLN
4.0000 [IU] | Freq: Once | INTRAMUSCULAR | Status: AC
  Administered 2024-01-31: 4 [IU] via SUBCUTANEOUS

## 2024-01-31 MED ORDER — INSULIN ASPART 100 UNIT/ML IJ SOLN
0.0000 [IU] | Freq: Three times a day (TID) | INTRAMUSCULAR | Status: DC
  Administered 2024-01-31: 3 [IU] via SUBCUTANEOUS
  Administered 2024-01-31: 7 [IU] via SUBCUTANEOUS
  Filled 2024-01-31: qty 3
  Filled 2024-01-31: qty 1

## 2024-01-31 MED ORDER — INSULIN GLARGINE 100 UNIT/ML ~~LOC~~ SOLN
10.0000 [IU] | Freq: Every day | SUBCUTANEOUS | Status: DC
  Administered 2024-01-31 – 2024-02-12 (×13): 10 [IU] via SUBCUTANEOUS
  Filled 2024-01-31 (×14): qty 0.1

## 2024-01-31 MED ORDER — PREDNISONE 20 MG PO TABS: 10.0000 mg | ORAL_TABLET | Freq: Every day | ORAL | Status: DC

## 2024-01-31 NOTE — Evaluation (Addendum)
 Occupational Therapy Evaluation Patient Details Name: Reginald [name] MRN: [id number] DOB: 08-Nov-1935 Today's Date: 01/31/2024   History of Present Illness   Pt is a 89 y.o. male who presented due to R foot pain. 1/13 s/p R AKA PMH: DM2, CAD s/p CABG, chronic hypoxic respiratory failure on 5L o2, HF with reduced EF 25-30%.     Clinical Impressions Patient is s/p R AKA surgery resulting in functional limitations due to the deficits listed below (see OT problem list). Pt during session required extensive time to rebound to 88-90% FIO2 on HHFNC. Pt reports fatigue after just sitting eob for 10 minutes. Pt HOH and benefits from deeper tone voice to hear.  Patient will benefit from skilled OT acutely to increase independence and safety with ADLS to allow discharge skilled inpatient follow up therapy, <3 hours/day. .      If plan is discharge home, recommend the following:   Two people to help with walking and/or transfers;Two people to help with bathing/dressing/bathroom     Functional Status Assessment   Patient has had a recent decline in their functional status and demonstrates the ability to make significant improvements in function in a reasonable and predictable amount of time.     Equipment Recommendations   Other (comment) (TBA)     Recommendations for Other Services         Precautions/Restrictions   Precautions Precautions: Fall Precaution/Restrictions Comments: HHFC Restrictions Weight Bearing Restrictions Per Provider Order: No     Mobility Bed Mobility Overal bed mobility: Needs Assistance Bed Mobility: Rolling, Supine to Sit, Sit to Supine Rolling: Min assist   Supine to sit: +2 for physical assistance, Min assist, HOB elevated, Used rails Sit to supine: Mod assist, HOB elevated, Used rails   General bed mobility comments: pt exiting the bed on the R side and initiate all task but needed extensive time to sit and rest due to desaturation     Transfers                          Balance                                           ADL either performed or assessed with clinical judgement   ADL Overall ADL's : Needs assistance/impaired Eating/Feeding: Minimal assistance;Bed level Eating/Feeding Details (indicate cue type and reason): opening packets and cutting food Grooming: Wash/dry face;Contact guard assist;Bed level   Upper Body Bathing: Maximal assistance;Bed level   Lower Body Bathing: Total assistance;Bed level   Upper Body Dressing : Maximal assistance;Bed level   Lower Body Dressing: Total assistance;Bed level                 General ADL Comments: bed level this session due to desaturation     Vision Baseline Vision/History: 1 Wears glasses Patient Visual Report: No change from baseline       Perception         Praxis         Pertinent Vitals/Pain Pain Assessment Pain Assessment: No/denies pain     Extremity/Trunk Assessment Upper Extremity Assessment Upper Extremity Assessment: Generalized weakness   Lower Extremity Assessment Lower Extremity Assessment: Defer to PT evaluation   Cervical / Trunk Assessment Cervical / Trunk Assessment: Kyphotic   Communication Communication Communication: Impaired Factors Affecting Communication: Hearing impaired   Cognition Arousal:  Alert Behavior During Therapy: [facility] for tasks assessed/performed Cognition: No apparent impairments                               Following commands: Intact       Cueing  General Comments      HHFC 30L 4702, 80-100 supine during MMT to rebound to 90% , 86-88 % EOB with 8 minutes to recover, 83% supine 30degree  4 minutes of recovery,   Exercises     Shoulder Instructions      Home Living Family/patient expects to be discharged to:: Private residence Living Arrangements: Spouse/significant other Available Help at Discharge: Family;Available 24  hours/day Type of Home: House Home Access: Ramped entrance     Home Layout: One level     Bathroom Shower/Tub: Sponge bathes at baseline (spouse helps)   Firefighter: Standard     Home Equipment: Agricultural Consultant (2 wheels);Transport chair;Wheelchair - manual;Grab bars - toilet   Additional Comments: uses walker in the house      Prior Functioning/Environment Prior Level of Function : Needs assist             Mobility Comments: walks with RW in the house, transport chair can pass door frame and uses for MD appts ADLs Comments: sponge bath with wife helping    OT Problem List: Decreased strength;Decreased activity tolerance;Impaired balance (sitting and/or standing);Decreased safety awareness;Decreased knowledge of use of DME or AE;Cardiopulmonary status limiting activity;Decreased knowledge of precautions   OT Treatment/Interventions: Self-care/ADL training;Therapeutic exercise;Energy conservation;DME and/or AE instruction;Therapeutic activities;Balance training;Patient/family education      OT Goals(Current goals can be found in the care plan section)   Acute Rehab OT Goals Patient Stated Goal: to d/c home with family OT Goal Formulation: With patient/family Time For Goal Achievement: 02/14/24 Potential to Achieve Goals: Good   OT Frequency:  Min 2X/week    Co-evaluation PT/OT/SLP Co-Evaluation/Treatment: Yes Reason for Co-Treatment: For patient/therapist safety;Complexity of the patient's impairments (multi-system involvement)   OT goals addressed during session: ADL's and self-care;Proper use of Adaptive equipment and DME      AM-PAC OT 6 Clicks Daily Activity     Outcome Measure Help from another person eating meals?: A Little Help from another person taking care of personal grooming?: A Little Help from another person toileting, which includes using toliet, bedpan, or urinal?: A Lot Help from another person bathing (including washing, rinsing,  drying)?: A Lot Help from another person to put on and taking off regular upper body clothing?: A Little Help from another person to put on and taking off regular lower body clothing?: A Lot 6 Click Score: 15   End of Session Equipment Utilized During Treatment: Oxygen Nurse Communication: Mobility status;Precautions;Need for lift equipment  Activity Tolerance: Patient limited by fatigue Patient left: in bed;with call bell/phone within reach;with bed alarm set;with family/visitor present  OT Visit Diagnosis: Unsteadiness on feet (R26.81);Muscle weakness (generalized) (M62.81)                Time: 1110-1145 OT Time Calculation (min): 38 min Charges:  OT General Charges $OT Visit: 1 Visit OT Evaluation $OT Eval Moderate Complexity: 1 Mod   Brynn, OTR/L  Acute Rehabilitation Services Office: [phone] .   Ely Molt 01/31/2024, 1:09 PM

## 2024-01-31 NOTE — Progress Notes (Signed)
 Pt arrived to 4E, oriented to unit, and CCMD notified. BP 95/61 (BP Location: Left Arm)   Pulse 92   Temp 98.1 F (36.7 C) (Oral)   Resp 18   Ht 5' 8 (1.727 m)   Wt 76.2 kg   SpO2 93%   BMI 25.54 kg/m

## 2024-01-31 NOTE — Progress Notes (Signed)
 eLink Physician-Brief Progress Note Patient Name: Reginald [name] DOB: [birth year] MRN: [id number]   Date of Service  01/31/2024  HPI/Events of Note  pt is on q4hr resistant sliding scale insulin , nurse asking if this can be changed. Trying to avoid starting insulin  gtt. Pt is on prednisone  and eating, last CBG was 294.  Can you take a look at it  eICU Interventions  Discussed. Novolog  4 units sub cutaneous once ordered as an extra coverage.      Intervention Category Intermediate Interventions: Other:;Hyperglycemia - evaluation and treatment  Jodelle ONEIDA Hutching 01/31/2024, 8:35 PM

## 2024-01-31 NOTE — Progress Notes (Addendum)
 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  Presented on 1/12 with worsening pain and discoloration of the right foot white blood cell count was 17.1 with a lactate of 2.2 ESR was 22.  He was seen by vascular surgery on 1/13 who felt he had critical limb ischemia of the right lower extremity with severe tissue loss of the entire forefoot, with cellulitis to the level of the mid calf.  This is not improved with antibiotics so the decision for below the knee amputation was made.  He was brought to the operating room on 1/13 Operative reports: Minimal EBL Brief episode of hypotension after induction When more tachycardic BP more labile   Pertinent  Medical History  Type 2 DM, prior CABG,ANA positive IPAF w/ chronic resp failure on mycophenolate for immunomodulation (followed at [facility]) HFrEF 25-30% severe PAD w/ gangrenous right foot  Significant Hospital Events: Including procedures, antibiotic start and stop dates in addition to other pertinent events   1/12 admitted with gangrenous right foot in spite of outpatient antibiotics 1/13 right BKA.  Transferred to the intensive care postoperatively on mechanical ventilation, anesthesia unable to wean FiO2  Interim History / Subjective:   extubated 1/13 to heated high flow. No events over night  Denies pain.  Currently on 47% heated high flow Wbc: 15.5-->11.3 Hgb 11->9.8->10.2  PLTs 236 Mg 1.9, Cr .54, K 4.1  O2 needs:  Pain:   Objective    Blood pressure 110/77,  pulse 88, temperature 97.7 F (36.5 C), temperature source Axillary, resp. rate 16, height 5' 8 (1.727 m), weight 76.2 kg, SpO2 91%.    Vent Mode: PRVC FiO2 (%):  [45 %-100 %] 45 % Set Rate:  [16 bmp] 16 bmp Vt Set:  [540 mL] 540 mL PEEP:  [5 cmH20] 5 cmH20 Plateau Pressure:  [16 cmH20] 16 cmH20   Intake/Output Summary (Last 24 hours) at 01/31/2024 0819 Last data filed at 01/31/2024 9344 Gross per 24 hour  Intake 1874.29 ml  Output 1200 ml  Net 674.29 ml   Filed Weights   01/29/24 1944 01/30/24 0500 01/31/24 0427  Weight: 88 kg 89 kg 76.2 kg    Examination: General This is a pleasant 89 year old male patient resting in bed he is currently in no acute distress HEENT normocephalic atraumatic no jugular venous distention is appreciated Pulmonary dry diffuse crackles.  Currently on 47% heated high flow no accessory use on this support saturations anywhere from 86 to 91% Cardiac regular irregular: Atrial fibrillation on telemetry Abdomen soft nontender no organomegaly Extremities are warm dry with brisk capillary refill, the right BKA dressing is clean dry and intact with no drainage noted or bloody shadow Neuro awake oriented x 3 GU external Foley in place  Resolved problem list  Ventilator management   Assessment and Plan   Gangrenous right lower extremity with osteomyelitis, cellulitis and critical limb ischemia, now status post right BKA No obvious bleeding over night Hgb 11-->10.2, dressing: Is clean dry and intact no drainage Plan AM CBC continuing IV  vancomycin  and cefepime ; spoke w/ ID. Antibiotics can be stopped at any point now.  Spoke to surgery, we will continue this through today and can be discontinued tomorrow after receiving 24 hours of antibiotics post amputation Postoperative antiplatelet therapy and or anticoagulation at the discretion of surgical team PT and OT consult Get OOB  chronic hypoxic respiratory failure in the setting of ANA positive interstitial  pulmonary fibrosis with autoimmune features. - Baseline reported to be on 5 L/min.  On CellCept and prednisone  at home, had been off from his CellCept for over 2 weeks prior to this admission.  Wife notes increasing supplemental oxygen needs prior to admit after stopping the CellCept -successfully extubated post op to heated high flow on 1/13.  -Current O2 needs: 47% Plan Wean FiO2 for oxygen saturation goal greater than 88% Holding CellCept with active infection for now: will d/w attending thoughts on when to resume.  Typically on prednisone  10 mg a day, Pulsed up to 40mg /d. Will change to oral, he has actually been off his CellCept for over 2 weeks.  Will discuss with Dr. Gretta a reasonable prednisone  plan Will substitute his home bronchodilator therapy with Brovana  and Yupelri   History of coronary artery disease with prior CABG, heart failure w/ reduced ejection fraction  EF 25 to 30% at baseline SBP ranging from 99 to 120s.. had isolated SBP 140s Plan Telemetry monitoring IV Lasix  today Hold losartan  and BB again today  Intermittent PAF (apparently noted by cards about 2 mo ago. Not currently on [facility]) Plan Tele Rate control  Repeat 12 lead today  Spoke with surgical team, they will evaluate surgical site on 1/15, could potentially start anticoagulation after that if no issues  Type 2 diabetes with diabetic neuropathy on metformin  and Jardiance Plan Hold Jardiance and metformin  Sliding scale insulin  Neurontin  BID  Hyperlipidemia Plan Cont statin and Lopid    History of depression Plan sertraline 50 mg daily   Progression: He is hemodynamically stable to move to the surgical stepdown unit on 4 E. assuming this is okay with vascular surgery   My time 31 min included: review of most recent records, direct face to face time obtaining history, performing physical exam, developing and documenting plan as well as discussing this plan with the patient and/or care givers.  [address]  on-going, stable,recovering or improving problem >25 min            "

## 2024-01-31 NOTE — Evaluation (Signed)
 Physical Therapy Evaluation Patient Details Name: Reginald [name] MRN: [id number] DOB: 06/30/1935 Today's Date: 01/31/2024  History of Present Illness  Pt is a 89 y.o. male who presented due to R foot pain. 1/13 s/p R AKA PMH: DM2, CAD s/p CABG, chronic hypoxic respiratory failure on 5L o2, HF with reduced EF 25-30%.  Clinical Impression  Pt in bed upon arrival of PT, agreeable to evaluation at this time. Prior to admission the pt was mobilizing with use of RW at home, limited to short distances in the home and using transport chair to leave the house. The pt lives with his wife who was assisting as needed for balance and ADLs. The pt presents with significant deficits in strength, balance, mobility, and activity tolerance, requiring increased rest time with cues for PLB. Pt needing 3-5 min recovery after movements in bed, and up to 8 min recovery time with attempt to sit EOB. The pt and his family are hopeful to progress ability to transfer to allow pt to return home with family support, will benefit from continued skilled PT acutely as well as post-acute intensive therapies to progress functional endurance and strength for transfers prior to return home.   HHFC on 30L FiO2 47% HR 80-100 during session with max 112bpm SpO2 to 83% when doing muscle testing when supine in bed - 5 minutes to rebound to 90% SpO2 to 85% with transition to sitting EOB - 8 minutes to recover to 88% SpO2 to 83% after transition to supine with HOB at 30 degree - 4 minutes of recovery      If plan is discharge home, recommend the following: A lot of help with walking and/or transfers;A lot of help with bathing/dressing/bathroom;Assistance with cooking/housework;Direct supervision/assist for medications management;Direct supervision/assist for financial management;Assist for transportation;Help with stairs or ramp for entrance   Can travel by private vehicle        Equipment Recommendations Wheelchair (measurements  PT);Wheelchair cushion (measurements PT);Hospital bed;Hoyer lift  Recommendations for Other Services  Rehab consult    Functional Status Assessment Patient has had a recent decline in their functional status and demonstrates the ability to make significant improvements in function in a reasonable and predictable amount of time.     Precautions / Restrictions Precautions Precautions: Fall Recall of Precautions/Restrictions: Intact Precaution/Restrictions Comments: HHFNC Restrictions Weight Bearing Restrictions Per Provider Order: Yes RLE Weight Bearing Per Provider Order: Non weight bearing      Mobility  Bed Mobility Overal bed mobility: Needs Assistance Bed Mobility: Rolling, Supine to Sit, Sit to Supine Rolling: Min assist   Supine to sit: +2 for physical assistance, Min assist, HOB elevated, Used rails Sit to supine: Mod assist, HOB elevated, Used rails   General bed mobility comments: pt exiting the bed on the R side and initiate all task but needed extensive time to sit and rest due to desaturation to mid 80s    Transfers                   General transfer comment: deferred due to poor activity tolerance        Balance Overall balance assessment: Needs assistance Sitting-balance support: Bilateral upper extremity supported, Feet supported Sitting balance-Leahy Scale: Fair Sitting balance - Comments: can maintain without support, dependent on UE support Postural control: Posterior lean  Pertinent Vitals/Pain Pain Assessment Pain Assessment: No/denies pain    Home Living Family/patient expects to be discharged to:: Private residence Living Arrangements: Spouse/significant other Available Help at Discharge: Family;Available 24 hours/day Type of Home: House Home Access: Ramped entrance       Home Layout: One level Home Equipment: Agricultural Consultant (2 wheels);Transport chair;Wheelchair - manual;Grab bars -  toilet Additional Comments: uses walker in the house    Prior Function Prior Level of Function : Needs assist             Mobility Comments: walks with RW in the house, transport chair can pass door frame and uses for MD appts ADLs Comments: sponge bath with wife helping     Extremity/Trunk Assessment   Upper Extremity Assessment Upper Extremity Assessment: Generalized weakness    Lower Extremity Assessment Lower Extremity Assessment: RLE deficits/detail RLE Deficits / Details: new AKA, ACE wrap on stump clean and dry. able to demo good ROM against gravity    Cervical / Trunk Assessment Cervical / Trunk Assessment: Kyphotic  Communication   Communication Communication: Impaired Factors Affecting Communication: Hearing impaired    Cognition Arousal: Alert Behavior During Therapy: WFL for tasks assessed/performed   PT - Cognitive impairments: No apparent impairments                         Following commands: Intact       Cueing Cueing Techniques: Verbal cues     General Comments General comments (skin integrity, edema, etc.): HHFC 30L FiO2 47%, HR 80-100 during session with max 112. when pt supine during MMT, SpO2 to 83% 5 minutes to rebound to 90%, with transition to sitting EOB, SpO2 to 86-88% with 8 minutes to recover, after repositioning in bed, SpO2 to 83% supine HOB at 30 degree  4 minutes of recovery    Exercises     Assessment/Plan    PT Assessment Patient needs continued PT services  PT Problem List Decreased strength;Decreased range of motion;Decreased activity tolerance;Decreased balance;Decreased mobility;Cardiopulmonary status limiting activity       PT Treatment Interventions DME instruction;Gait training;Stair training;Functional mobility training;Therapeutic activities;Therapeutic exercise;Balance training;Patient/family education    PT Goals (Current goals can be found in the Care Plan section)  Acute Rehab PT Goals Patient  Stated Goal: to return home PT Goal Formulation: With patient Time For Goal Achievement: 02/14/24 Potential to Achieve Goals: Fair    Frequency Min 2X/week     Co-evaluation   Reason for Co-Treatment: For patient/therapist safety;Complexity of the patient's impairments (multi-system involvement)   OT goals addressed during session: ADL's and self-care;Proper use of Adaptive equipment and DME       AM-PAC PT 6 Clicks Mobility  Outcome Measure Help needed turning from your back to your side while in a flat bed without using bedrails?: A Little Help needed moving from lying on your back to sitting on the side of a flat bed without using bedrails?: A Little Help needed moving to and from a bed to a chair (including a wheelchair)?: Total Help needed standing up from a chair using your arms (e.g., wheelchair or bedside chair)?: Total Help needed to walk in hospital room?: Total Help needed climbing 3-5 steps with a railing? : Total 6 Click Score: 10    End of Session Equipment Utilized During Treatment: Oxygen Activity Tolerance: Patient limited by fatigue;Other (comment) (oxygen desaturation) Patient left: in bed;with call bell/phone within reach;with bed alarm set;with family/visitor present Nurse Communication: Mobility status  PT Visit Diagnosis: Unsteadiness on feet (R26.81);Muscle weakness (generalized) (M62.81)    Time: 8890-8860 PT Time Calculation (min) (ACUTE ONLY): 30 min   Charges:   PT Evaluation $PT Eval Moderate Complexity: 1 Mod   PT General Charges $$ ACUTE PT VISIT: 1 Visit         Izetta Call, PT, DPT   Acute Rehabilitation Department Office [phone] Secure Chat Communication Preferred  Izetta JULIANNA Call 01/31/2024, 1:31 PM

## 2024-01-31 NOTE — Progress Notes (Signed)
 Patient was transported to 4E04 and HHFNC was returned to 35L 53%.  Patient vitals WNL     01/31/24 1318  Therapy Vitals  Pulse Rate 98  Resp 20  Oxygen Therapy/Pulse Ox  O2 Device HHFNC  SpO2 91 %  O2 Therapy Oxygen humidified  O2 Flow Rate (L/min) 35 L/min  FiO2 (%) 53 %  Heated High Flow Nasal Cannula  Adult Large  Heater temperature 95 F (35 C)  Equipment wiped down Yes

## 2024-01-31 NOTE — Progress Notes (Signed)
" ° °  Inpatient Rehab Admissions Coordinator :  Per therapy recommendations patient was screened for CIR candidacy by Heron Leavell RN MSN. Patient is not yet at a level to tolerate the intensity required to pursue a CIR admit. Noted on HFNC. The CIR admissions team will follow and monitor for progress and place a Rehab Consult order if felt to be appropriate. Please contact me with any questions.  Heron Leavell RN MSN Admissions Coordinator [phone]  "

## 2024-01-31 NOTE — Progress Notes (Signed)
" °  Daily Progress Note  S/p: Right above-knee amputation  Subjective: Doing reasonably well this morning, extubated yesterday afternoon in the ICU continues to be on high flow nasal cannula  Objective: Vitals:   01/31/24 1000 01/31/24 1015  BP: 97/73   Pulse: 87 89  Resp: 18 20  Temp:    SpO2: (!) 88% (!) 89%    Physical Examination HDS Nonlabored breathing Right AKA dressing clean and dry  ASSESSMENT/PLAN:  89 year old male who presented with a gangrenous right lower extremity, underwent right above-knee amputation on 01/30/2024. Postoperatively was transferred to the ICU intubated due to his chronic lung disease with plans to extubate after further stabilization.  He was extubated later that afternoon and is on high flow nasal cannula.  Right AKA dressing is clean and dry, plan to remove and evaluate incision tomorrow.   Norman GORMAN Serve MD [facility] [phone] 01/31/2024  11:22 AM  "

## 2024-02-01 ENCOUNTER — Inpatient Hospital Stay (HOSPITAL_COMMUNITY)

## 2024-02-01 DIAGNOSIS — L089 Local infection of the skin and subcutaneous tissue, unspecified: Secondary | ICD-10-CM | POA: Diagnosis not present

## 2024-02-01 DIAGNOSIS — I5023 Acute on chronic systolic (congestive) heart failure: Secondary | ICD-10-CM

## 2024-02-01 DIAGNOSIS — I5021 Acute systolic (congestive) heart failure: Secondary | ICD-10-CM

## 2024-02-01 LAB — ECHOCARDIOGRAM COMPLETE
Area-P 1/2: 6.22 cm2
Height: 68 in
S' Lateral: 3.1 cm
Single Plane A4C EF: 49.2 %
Weight: 2515.01 [oz_av]

## 2024-02-01 LAB — BASIC METABOLIC PANEL WITH GFR
Anion gap: 8 (ref 5–15)
BUN: 21 mg/dL (ref 8–23)
CO2: 26 mmol/L (ref 22–32)
Calcium: 9.3 mg/dL (ref 8.9–10.3)
Chloride: 101 mmol/L (ref 98–111)
Creatinine, Ser: 0.69 mg/dL (ref 0.61–1.24)
GFR, Estimated: >60 mL/min
Glucose, Bld: 128 mg/dL — ABNORMAL HIGH (ref 70–99)
Potassium: 4.8 mmol/L (ref 3.5–5.1)
Sodium: 135 mmol/L (ref 135–145)

## 2024-02-01 LAB — GLUCOSE, CAPILLARY
Glucose-Capillary: 129 mg/dL — ABNORMAL HIGH (ref 70–99)
Glucose-Capillary: 132 mg/dL — ABNORMAL HIGH (ref 70–99)
Glucose-Capillary: 135 mg/dL — ABNORMAL HIGH (ref 70–99)
Glucose-Capillary: 141 mg/dL — ABNORMAL HIGH (ref 70–99)
Glucose-Capillary: 147 mg/dL — ABNORMAL HIGH (ref 70–99)
Glucose-Capillary: 172 mg/dL — ABNORMAL HIGH (ref 70–99)
Glucose-Capillary: 201 mg/dL — ABNORMAL HIGH (ref 70–99)

## 2024-02-01 LAB — CBC
HCT: 35.4 % — ABNORMAL LOW (ref 39.0–52.0)
Hemoglobin: 10.3 g/dL — ABNORMAL LOW (ref 13.0–17.0)
MCH: 25.3 pg — ABNORMAL LOW (ref 26.0–34.0)
MCHC: 29.1 g/dL — ABNORMAL LOW (ref 30.0–36.0)
MCV: 87 fL (ref 80.0–100.0)
Platelets: 322 K/uL (ref 150–400)
RBC: 4.07 MIL/uL — ABNORMAL LOW (ref 4.22–5.81)
RDW: 16.4 % — ABNORMAL HIGH (ref 11.5–15.5)
WBC: 14.7 K/uL — ABNORMAL HIGH (ref 4.0–10.5)
nRBC: 0 % (ref 0.0–0.2)

## 2024-02-01 LAB — SURGICAL PATHOLOGY

## 2024-02-01 MED ORDER — INSULIN ASPART 100 UNIT/ML IJ SOLN
0.0000 [IU] | Freq: Three times a day (TID) | INTRAMUSCULAR | Status: DC
  Administered 2024-02-01 – 2024-02-02 (×2): 3 [IU] via SUBCUTANEOUS
  Administered 2024-02-02: 2 [IU] via SUBCUTANEOUS
  Administered 2024-02-03 – 2024-02-04 (×2): 1 [IU] via SUBCUTANEOUS
  Administered 2024-02-04: 2 [IU] via SUBCUTANEOUS
  Administered 2024-02-04: 1 [IU] via SUBCUTANEOUS
  Administered 2024-02-05: 3 [IU] via SUBCUTANEOUS
  Administered 2024-02-05 – 2024-02-06 (×2): 1 [IU] via SUBCUTANEOUS
  Administered 2024-02-07: 3 [IU] via SUBCUTANEOUS
  Administered 2024-02-07 – 2024-02-11 (×7): 2 [IU] via SUBCUTANEOUS
  Administered 2024-02-12: 5 [IU] via SUBCUTANEOUS
  Administered 2024-02-13 – 2024-02-14 (×2): 3 [IU] via SUBCUTANEOUS
  Filled 2024-02-01 (×14): qty 1
  Filled 2024-02-01: qty 2
  Filled 2024-02-01 (×4): qty 1
  Filled 2024-02-01: qty 3
  Filled 2024-02-01: qty 1

## 2024-02-01 MED ORDER — FUROSEMIDE 10 MG/ML IJ SOLN
40.0000 mg | Freq: Once | INTRAMUSCULAR | Status: AC
  Administered 2024-02-01: 40 mg via INTRAVENOUS
  Filled 2024-02-01: qty 4

## 2024-02-01 MED ORDER — PREDNISONE 20 MG PO TABS
40.0000 mg | ORAL_TABLET | Freq: Every day | ORAL | Status: DC
  Administered 2024-02-01 – 2024-02-13 (×13): 40 mg via ORAL
  Filled 2024-02-01 (×12): qty 2

## 2024-02-01 MED ORDER — INSULIN ASPART 100 UNIT/ML IJ SOLN
3.0000 [IU] | Freq: Three times a day (TID) | INTRAMUSCULAR | Status: DC
  Administered 2024-02-01 – 2024-02-12 (×28): 3 [IU] via SUBCUTANEOUS
  Filled 2024-02-01: qty 3
  Filled 2024-02-01 (×18): qty 1
  Filled 2024-02-01: qty 3
  Filled 2024-02-01 (×8): qty 1

## 2024-02-01 MED ORDER — INSULIN ASPART 100 UNIT/ML IJ SOLN
0.0000 [IU] | Freq: Every day | INTRAMUSCULAR | Status: DC
  Administered 2024-02-05 – 2024-02-07 (×2): 2 [IU] via SUBCUTANEOUS
  Filled 2024-02-01 (×2): qty 1

## 2024-02-01 MED ORDER — PERFLUTREN LIPID MICROSPHERE
1.0000 mL | INTRAVENOUS | Status: AC | PRN
  Administered 2024-02-01: 3 mL via INTRAVENOUS

## 2024-02-01 MED ORDER — SULFAMETHOXAZOLE-TRIMETHOPRIM 800-160 MG PO TABS
1.0000 | ORAL_TABLET | ORAL | Status: DC
  Administered 2024-02-02 – 2024-02-14 (×6): 1 via ORAL
  Filled 2024-02-01 (×6): qty 1

## 2024-02-01 MED ORDER — METOPROLOL TARTRATE 5 MG/5ML IV SOLN: 2.5000 mg | Freq: Four times a day (QID) | INTRAVENOUS | Status: DC | PRN

## 2024-02-01 MED ORDER — METOPROLOL TARTRATE 25 MG PO TABS
25.0000 mg | ORAL_TABLET | Freq: Every day | ORAL | Status: DC
  Administered 2024-02-01 – 2024-02-02 (×2): 25 mg via ORAL
  Filled 2024-02-01 (×2): qty 1

## 2024-02-01 MED ORDER — HEPARIN (PORCINE) 25000 UT/250ML-% IV SOLN
1100.0000 [IU]/h | INTRAVENOUS | Status: DC
  Administered 2024-02-01: 1000 [IU]/h via INTRAVENOUS
  Filled 2024-02-01: qty 250

## 2024-02-01 NOTE — Progress Notes (Signed)
" °  Echocardiogram 2D Echocardiogram has been performed.  Tinnie FORBES Gosling RDCS 02/01/2024, 2:48 PM "

## 2024-02-01 NOTE — Progress Notes (Addendum)
" °  Progress Note    02/01/2024 7:49 AM 2 Days Post-Op  Subjective:  no complaints   Vitals:   02/01/24 0120 02/01/24 0300  BP:  119/86  Pulse:  76  Resp:  20  Temp:  (!) 97.4 F (36.3 C)  SpO2: 95% 97%   Physical Exam: Lungs:  non labored on high flow [place] Incisions:  R AKA well appearing with viable skin edges Neurologic: A&O    CBC    Component Value Date/Time   WBC 14.7 (H) 02/01/2024 0336   RBC 4.07 (L) 02/01/2024 0336   HGB 10.3 (L) 02/01/2024 0336   HCT 35.4 (L) 02/01/2024 0336   PLT 322 02/01/2024 0336   MCV 87.0 02/01/2024 0336   MCH 25.3 (L) 02/01/2024 0336   MCHC 29.1 (L) 02/01/2024 0336   RDW 16.4 (H) 02/01/2024 0336    BMET    Component Value Date/Time   NA 135 02/01/2024 0336   K 4.8 02/01/2024 0336   CL 101 02/01/2024 0336   CO2 26 02/01/2024 0336   GLUCOSE 128 (H) 02/01/2024 0336   BUN 21 02/01/2024 0336   CREATININE 0.69 02/01/2024 0336   CALCIUM  9.3 02/01/2024 0336   GFRNONAA >60 02/01/2024 0336   GFRAA >60 08/30/2016 1346    INR    Component Value Date/Time   INR 1.3 (H) 01/30/2024 0340     Intake/Output Summary (Last 24 hours) at 02/01/2024 0749 Last data filed at 01/31/2024 2353 Gross per 24 hour  Intake 487.15 ml  Output 750 ml  Net -262.85 ml     Assessment/Plan:  89 y.o. male is s/p R AKA 2 Days Post-Op   Dressing changed.  R AKA is healing well with viable skin edges.  Ok to transfer to CIR when approved.   Donnice Sender, PA-C [facility] [phone] 02/01/2024 7:49 AM   I agree with the above.  Right AKA appears to be healing well.  Will have follow-up set up by our office for staple removal. Norman GORMAN Serve MD [facility] Phone Number: [phone] 02/01/2024 10:11 AM  "

## 2024-02-01 NOTE — Plan of Care (Signed)
  Problem: Education: Goal: Knowledge of General Education information will improve Description: Including pain rating scale, medication(s)/side effects and non-pharmacologic comfort measures Outcome: Progressing   Problem: Clinical Measurements: Goal: Ability to maintain clinical measurements within normal limits will improve Outcome: Progressing Goal: Respiratory complications will improve Outcome: Progressing Goal: Cardiovascular complication will be avoided Outcome: Progressing   Problem: Activity: Goal: Risk for activity intolerance will decrease Outcome: Progressing   Problem: Nutrition: Goal: Adequate nutrition will be maintained Outcome: Progressing   Problem: Elimination: Goal: Will not experience complications related to urinary retention Outcome: Progressing   Problem: Pain Managment: Goal: General experience of comfort will improve and/or be controlled Outcome: Progressing   Problem: Skin Integrity: Goal: Risk for impaired skin integrity will decrease Outcome: Progressing

## 2024-02-01 NOTE — Progress Notes (Signed)
" ° °  Inpatient Rehabilitation Admissions Coordinator   Remains on HFNC. Not at  level to pursue CIR admit.  Heron Leavell, RN, MSN Rehab Admissions Coordinator [phone] 02/01/2024 2:00 PM  "

## 2024-02-01 NOTE — Progress Notes (Signed)
" ° °  Spoke with [facility], Dr. Emi, will start IV heparin  for now with plans to transition to eliquis  once able to ensure no post op bleeding issues.   Bonney Manuelita Rummer, NP-C 02/01/2024, 4:47 PM Pager: [phone]  "

## 2024-02-01 NOTE — Progress Notes (Signed)
 PHARMACY - ANTICOAGULATION CONSULT NOTE  Pharmacy Consult for heparin  Indication: atrial fibrillation  Allergies[1]  Patient Measurements: Height: 5' 8 (172.7 cm) Weight: 71.3 kg (157 lb 3 oz) IBW/kg (Calculated) : 68.4 HEPARIN  DW (KG): 86.3  Vital Signs: Temp: 97.9 F (36.6 C) (01/15 0814) Temp Source: Oral (01/15 0814) BP: 127/88 (01/15 1500) Pulse Rate: 67 (01/15 1500)  Labs: Recent Labs    01/29/24 1849 01/30/24 0340 01/30/24 1541 01/31/24 0323 02/01/24 0336  HGB 11.1* 11.0* 9.8* 10.2* 10.3*  HCT 37.8* 36.2* 34.2* 34.0* 35.4*  PLT 268 269 279 236 322  APTT  --  46*  --   --   --   LABPROT 16.7* 16.9*  --   --   --   INR 1.3* 1.3*  --   --   --   CREATININE 0.67 0.54*  --  0.54* 0.69    Estimated Creatinine Clearance: 64.1 mL/min (by C-G formula based on SCr of 0.69 mg/dL).   Medical History: Past Medical History:  Diagnosis Date   Arthritis    Complication of anesthesia    2014 BP DROPPED S/P BACK SURGERY REC. IV FLUIDS   Diabetes mellitus without complication (HCC)    Disc displacement, lumbar    Full dentures    GERD (gastroesophageal reflux disease)    Hearing deficit    does not wear H/A   History of kidney stones    Hypertension    Kidney stone    Neuromuscular disorder (HCC)    Pneumonia    Umbilical hernia    Wears glasses     Medications:  Medications Prior to Admission  Medication Sig Dispense Refill Last Dose/Taking   albuterol (VENTOLIN HFA) 108 (90 Base) MCG/ACT inhaler Inhale 1 puff into the lungs every 6 (six) hours as needed for wheezing or shortness of breath.   Past Week   aspirin  EC 81 MG tablet Take 81 mg by mouth at bedtime.    01/28/2024 Morning   atorvastatin  (LIPITOR) 40 MG tablet Take 40 mg by mouth daily.   01/28/2024 Morning   B Complex Vitamins (B COMPLEX 100 PO) Take 1 tablet by mouth daily at 2 PM.   01/28/2024 Morning   Cholecalciferol  (VITAMIN D3) 2000 units TABS Take 2,000 Units by mouth daily at 2 PM.   01/28/2024  Evening   empagliflozin (JARDIANCE) 10 MG TABS tablet Take 10 mg by mouth daily.   01/28/2024 Morning   fexofenadine (ALLEGRA) 180 MG tablet Take 180 mg by mouth daily as needed (for allergies.).    01/28/2024 Morning   furosemide  (LASIX ) 40 MG tablet Take 40 mg by mouth daily.   01/28/2024 Morning   gabapentin  (NEURONTIN ) 300 MG capsule Take 300 mg by mouth 2 (two) times daily.   01/28/2024 Evening   gemfibrozil  (LOPID ) 600 MG tablet Take 600 mg by mouth daily.   01/28/2024 Morning   ipratropium (ATROVENT ) 0.06 % nasal spray Place 2 sprays into both nostrils 3 (three) times daily.   01/28/2024 Evening   levofloxacin (LEVAQUIN) 500 MG tablet Take 500 mg by mouth daily as needed (for urinary discomfort).   Past Month   LORazepam  (ATIVAN ) 0.5 MG tablet Take 0.5 mg by mouth every 6 (six) hours as needed for anxiety.   Past Month   losartan  (COZAAR ) 25 MG tablet Take 25 mg by mouth daily.   01/28/2024 Morning   metFORMIN  (GLUCOPHAGE ) 1000 MG tablet Take 1,000 mg by mouth See admin instructions. 1000 am 500 pm  3 01/28/2024  Evening   metoprolol  succinate (TOPROL -XL) 25 MG 24 hr tablet Take 25 mg by mouth daily.   01/28/2024 Morning   montelukast  (SINGULAIR ) 10 MG tablet Take 10 mg by mouth daily at 2 PM.    01/28/2024 Morning   Multiple Vitamin (MULTIVITAMIN WITH MINERALS) TABS tablet Take 1 tablet by mouth daily.   01/28/2024 Morning   mycophenolate (CELLCEPT) 500 MG tablet Take 500 mg by mouth 2 (two) times daily.   Past Week   Omega-3 Fatty Acids (FISH OIL) 1000 MG CAPS Take 1,000 mg by mouth every evening.   01/28/2024 Evening   omeprazole (PRILOSEC) 40 MG capsule Take 40 mg by mouth in the morning and at bedtime.   01/28/2024 Evening   predniSONE  (DELTASONE ) 10 MG tablet Take 10 mg by mouth daily with breakfast.   01/28/2024 Morning   sertraline (ZOLOFT) 50 MG tablet Take 50 mg by mouth daily.   01/28/2024 Morning   sodium chloride  (OCEAN) 0.65 % SOLN nasal spray Place 1 spray into both nostrils daily as needed.  Saline Mixture patient makes.   Past Week   umeclidinium-vilanterol (ANORO ELLIPTA ) 62.5-25 MCG/ACT AEPB Inhale 1 puff into the lungs daily.   01/28/2024 Morning   vitamin C  (ASCORBIC ACID ) 500 MG tablet Take 500 mg by mouth daily at 2 PM.   01/28/2024 Morning   atorvastatin  (LIPITOR) 10 MG tablet Take 10 mg by mouth daily with lunch. (Patient not taking: Reported on 01/29/2024)  6 Not Taking    Assessment: 89 y.o. M s/p R AKA on 1/13. Pt with new onset aflutter. To start heparin  gtt for (okay with [facility]) and then transition to Eliquis  once ensure there is not post-op bleeding issues. No AC PTA. SQ heparin  given at 1439. CBC stable.  Goal of Therapy:  Heparin  level 0.3-0.7 units/ml Monitor platelets by anticoagulation protocol: Yes   Plan:  D/c SQ heparin  Start heparin  1000 units/hr. No bolus with recent SQ dose given and recent surgery. 8 hr heparin  level Daily heparin  level and CBC  Vito Ralph, PharmD, BCPS Please see amion for complete clinical pharmacist phone list 02/01/2024,4:52 PM      [1]  Allergies Allergen Reactions   Naproxen Itching and Rash   Roxicet [Oxycodone-Acetaminophen ] Itching and Rash

## 2024-02-01 NOTE — TOC Initial Note (Addendum)
 Transition of Care (TOC) - Initial/Assessment Note  Rayfield Gobble RN, BSN Inpatient Care Management Unit 4E- RN Case Manager See Treatment Team for direct phone #   Patient Details  Name: Reginald [name] MRN: [id number] Date of Birth: Oct 19, 1935  Transition [facility] Care [facility]) CM/SW Contact:    Gobble Rayfield Hurst, RN Phone Number: 02/01/2024, 11:58 AM  Clinical Narrative:                 Pt transferred from outside hospital. From home w/ wife. On baseline home 02-5L.   Pt s/p R-AKA on 1/13, still requiring 40L heated HF.  CIR following for potential admit pending progress and ability to tolerate intensity of CIR.   ICM will continue to monitor patient advancement through interdisciplinary progression rounds. If new patient transition needs arise, please place a ICM (CM/CSW) consult.  Expected Discharge Plan: IP Rehab Facility Barriers to Discharge: Continued Medical Work up   Patient Goals and CMS Choice Patient states their goals for this hospitalization and ongoing recovery are:: return home   Choice offered to / list presented to : Spouse, Patient      Expected Discharge Plan and Services   Discharge Planning Services: CM Consult Post Acute Care Choice: IP Rehab Living arrangements for the past 2 months: Single Family Home                                      Prior Living Arrangements/Services Living arrangements for the past 2 months: Single Family Home Lives with:: Spouse Patient language and need for interpreter reviewed:: Yes        Need for Family Participation in Patient Care: Yes (Comment) Care giver support system in place?: Yes (comment)   Criminal Activity/Legal Involvement Pertinent to Current Situation/Hospitalization: No - Comment as needed  Activities of Daily Living   ADL Screening (condition at time of admission) Independently performs ADLs?: No Does the patient have a NEW difficulty with bathing/dressing/toileting/self-feeding that is  expected to last >3 days?: No Does the patient have a NEW difficulty with getting in/out of bed, walking, or climbing stairs that is expected to last >3 days?: Yes (Initiates electronic notice to provider for possible PT consult) Does the patient have a NEW difficulty with communication that is expected to last >3 days?: No Is the patient deaf or have difficulty hearing?: Yes Does the patient have difficulty seeing, even when wearing glasses/contacts?: No Does the patient have difficulty concentrating, remembering, or making decisions?: No  Permission Sought/Granted                  Emotional Assessment         Alcohol / Substance Use: Not Applicable Psych Involvement: No (comment)  Admission diagnosis:  Gangrene of foot (HCC) [I96] Diabetes (HCC) [E11.9] Foot infection [L08.9] Gangrene of right foot (HCC) [I96] Patient Active Problem List   Diagnosis Date Noted   ILD (interstitial lung disease) (HCC) 01/31/2024   Acute on chronic heart failure with preserved ejection fraction (HFpEF) (HCC) 01/31/2024   Acute on chronic respiratory failure with hypoxia (HCC) 01/31/2024   On mechanically assisted ventilation (HCC) 01/30/2024   Acute respiratory failure with hypoxia (HCC) 01/30/2024   Foot infection 01/29/2024   Gangrene of right foot (HCC) 01/29/2024   DM II (diabetes mellitus, type II), controlled (HCC) 01/29/2024   HFrEF (heart failure with reduced ejection fraction) (HCC) 01/29/2024   Lumbar herniated disc  08/30/2016   Spondylolisthesis of lumbar region 05/21/2014   PCP:  System, Provider Not In Pharmacy:   [facility] [place], [place] - [address] [place] [address] Phone: [phone] Fax: [phone]  [id number] [place], [place] - [address] [place] [address] Phone: [phone] Fax: [phone]     Social Drivers of Health (SDOH) Social History: SDOH Screenings   Food Insecurity: No Food  Insecurity (01/29/2024)  Housing: Low Risk (01/29/2024)  Transportation Needs: No Transportation Needs (01/29/2024)  Utilities: Not At Risk (01/29/2024)  Financial Resource Strain: Low Risk (01/02/2024)   Received from [facility]  Physical Activity: Insufficiently Active (01/02/2024)   Received from [facility]  Social Connections: Moderately Integrated (01/29/2024)  Stress: No Stress Concern Present (01/02/2024)   Received from [facility]  Tobacco Use: Medium Risk (01/29/2024)  Health Literacy: Low Risk (01/02/2024)   Received from [facility]   SDOH Interventions:     Readmission Risk Interventions     No data to display

## 2024-02-01 NOTE — Consult Note (Addendum)
 "  Cardiology Consultation   Reginald [name] ID: Reginald Reginald [name] Reginald [name] MRN: [id number]; DOB: 11/12/1935  Admit date: 01/29/2024 Date of Consult: 02/01/2024  PCP:  System, Provider Not In   [facility] Providers Cardiologist:  Dr. Aretta [facility])   Reginald Reginald [name] Reginald [name]: Reginald Reginald [name] Reginald [name] is a 89 y.o. male with a hx of CAD s/p 3v CABG '21, ICM (LVEF of 25%, severe RV dysfunction), PAD, HTN, DM, pulmonary HTN, interstitial pneumonia with autoimmune features abd right foot gangrene on antibiotics who is being seen 02/01/2024 for Reginald Reginald [name] Reginald [name] at Reginald Reginald [name] request of Dr. Leotis.  History of Present Illness: Reginald Reginald [name] Reginald [name] is an 89 year old male with past medical history noted above.  He is followed by [facility] cardiology as an outpatient.  He underwent three-vessel CABG at [facility] in 2021.  He has a known history of ischemic cardiomyopathy with an EF of 25 to 30%, severe RV dysfunction and global hypokinesis, RVSP 71 mmHg on echocardiogram 11/2023.  He was last seen in his cardiologist office 12/27/2023 for follow-up.  Reported he had recently started on CellCept and had some improvement in his shortness of breath.  He had upcoming ABIs planned with vascular and had known gangrene of his right foot.  Was planning for upcoming surgery.  It was noted at this visit as Reginald Reginald [name] Reginald [name] was to proceed with vascular surgery or expressed interest in ICD placement they would consider cardiac catheterization.  He was recommended to start Xarelto 2.5 mg twice daily given his history of PAD.  Presented to Reginald Reginald [name] ED on 1/12 with worsening pain and redness in his right foot despite being on 3 weeks of antibiotics.  Noticed worsening discoloration of his toes as well as worsening pain.  He was admitted for further evaluation.  Seen by vascular surgery, Dr. Silver with recommendations for right AKA.  Underwent successful right above-knee amputation on 1/13 with Dr. Pearline.  Noted to have occultly with oxygenation while in Reginald Reginald [name] OR and was transferred  to Reginald Reginald [name] ICU thereafter.  Was able to be extubated later that day.  Transferred to internal medicine service on 1/15.  Cardiology has been consulted in regards to new onset atrial [name] with intermittent episodes of RVR.   Review of telemetry suspect Reginald [name] has been in atrial [name] since admission.   He denies any known history of Reginald Reginald [name] same but his wife does mention that she thought his cardiologist mentioned an irregular rhythm at Reginald Reginald [name] last visit.  Past Medical History:  Diagnosis Date   Arthritis    Complication of anesthesia    2014 BP DROPPED S/P BACK SURGERY REC. IV FLUIDS   Diabetes mellitus without complication (HCC)    Disc displacement, lumbar    Full dentures    GERD (gastroesophageal reflux disease)    Hearing deficit    does not wear H/A   History of kidney stones    Hypertension    Kidney stone    Neuromuscular disorder (HCC)    Pneumonia    Umbilical hernia    Wears glasses     Past Surgical History:  Procedure Laterality Date   AMPUTATION Right 01/30/2024   Procedure: Right above Reginald Reginald [name] Knee AMPUTATION.;  Surgeon: Reginald [name] Norman RAMAN, MD;  Location: [facility] OR;  Service: Vascular;  Laterality: Right;   BACK SURGERY     CATARACT EXTRACTION W/ INTRAOCULAR LENS  IMPLANT, BILATERAL     COLONOSCOPY W/ BIOPSIES AND POLYPECTOMY     LUMBAR LAMINECTOMY/DECOMPRESSION MICRODISCECTOMY Right 08/30/2016   Procedure: Right lumbar three-four  microdiscectomy;  Surgeon: Mavis Purchase, MD;  Location: [facility] OR;  Service: Neurosurgery;  Laterality: Right;   MULTIPLE TOOTH EXTRACTIONS     NOSE SURGERY     deviated septum, and blockage   rottor cuff repair      left       Scheduled Meds:  acetaminophen   650 mg Oral TID   arformoterol   15 mcg Nebulization BID   ascorbic acid   500 mg Oral Q1400   aspirin   81 mg Oral Daily   atorvastatin   40 mg Oral Q lunch   Chlorhexidine  Gluconate Cloth  6 each Topical Daily   cholecalciferol   2,000 Units Oral Q1400   fenofibrate   54 mg Oral Daily    gabapentin   300 mg Oral BID   heparin   5,000 Units Subcutaneous Q8H   insulin  aspart  3-9 Units Subcutaneous Q4H   insulin  glargine  10 Units Subcutaneous Daily   ipratropium  2 spray Each Nare TID   metoprolol  tartrate  25 mg Oral Daily   multivitamin with minerals  1 tablet Oral Daily   pantoprazole  (PROTONIX ) IV  40 mg Intravenous Daily   polyethylene glycol  17 g Oral Daily   predniSONE   40 mg Oral Q breakfast   revefenacin   175 mcg Nebulization Daily   senna  1 tablet Oral BID   sodium chloride  flush  3 mL Intravenous Q12H   [START ON 02/02/2024] sulfamethoxazole -trimethoprim   1 tablet Oral Once per day on Monday Wednesday Friday   traMADol   50 mg Oral Q6H   Continuous Infusions:  ceFEPime  (MAXIPIME ) IV 2 g (02/01/24 0511)   vancomycin  1,500 mg (02/01/24 0907)   PRN Meds: HYDROmorphone  (DILAUDID ) injection, LORazepam , [DISCONTINUED] ondansetron  **OR** ondansetron  (ZOFRAN ) IV, mouth rinse, senna-docusate, sodium chloride  flush  Allergies:   Allergies[1]  Social History:   Social History   Socioeconomic History   Marital status: Married    Spouse name: Not on file   Number of children: Not on file   Years of education: Not on file   Highest education level: Not on file  Occupational History   Not on file  Tobacco Use   Smoking status: Former    Types: Cigarettes   Smokeless tobacco: Former    Types: Chew   Tobacco comments:    Quit smoking  in 1987  Vaping Use   Vaping status: Never Used  Substance and Sexual Activity   Alcohol use: No   Drug use: No   Sexual activity: Not on file  Other Topics Concern   Not on file  Social History Narrative   Not on file   Social Drivers of Health   Tobacco Use: Medium Risk (01/29/2024)   Reginald [name] History    Smoking Tobacco Use: Former    Smokeless Tobacco Use: Former    Passive Exposure: Not on Actuary Strain: Low Risk (01/02/2024)   Received from [facility]   Overall Financial Resource Strain  (CARDIA)    How hard is it for you to pay for Reginald Reginald [name] very basics like food, housing, medical care, and heating?: Not hard at all  Food Insecurity: No Food Insecurity (01/29/2024)   Epic    Worried About Radiation Protection Practitioner of Food in Reginald Reginald [name] Last Year: Never true    Ran Out of Food in Reginald Reginald [name] Last Year: Never true  Transportation Needs: No Transportation Needs (01/29/2024)   Epic    Lack of Transportation (Medical): No    Lack of Transportation (Non-Medical): No  Physical  Activity: Insufficiently Active (01/02/2024)   Received from [facility]   Exercise Vital Sign    On average, how many days per week do you engage in moderate to strenuous exercise (like a brisk walk)?: 3 days    On average, how many minutes do you engage in exercise at this level?: 30 min  Stress: No Stress Concern Present (01/02/2024)   Received from [facility] - Occupational Stress Questionnaire    Do you feel stress - tense, restless, nervous, or anxious, or unable to sleep at night because your mind is troubled all Reginald Reginald [name] time - these days?: Not at all  Social Connections: Moderately Integrated (01/29/2024)   Social Connection and Isolation Panel    Frequency of Communication with Friends and Family: Twice a week    Frequency of Social Gatherings with Friends and Family: Once a week    Attends Religious Services: 1 to 4 times per year    Active Member of [employer] or Organizations: No    Attends Banker Meetings: Never    Marital Status: Married  Catering Manager Violence: Not At Risk (01/29/2024)   Epic    Fear of Current or Ex-Partner: No    Emotionally Abused: No    Physically Abused: No    Sexually Abused: No  Depression (PHQ2-9): Not on file  Alcohol Screen: Not on file  Housing: Low Risk (01/29/2024)   Epic    Unable to Pay for Housing in Reginald Reginald [name] Last Year: No    Number of Times Moved in Reginald Reginald [name] Last Year: 0    Homeless in Reginald Reginald [name] Last Year: No  Utilities: Not At Risk  (01/29/2024)   Epic    Threatened with loss of utilities: No  Health Literacy: Low Risk (01/02/2024)   Received from [facility] Literacy    How often do you need to have someone help you when you read instructions, pamphlets, or other written material from your doctor or pharmacy?: Never    Family History:    Family History  Problem Relation Age of Onset   Stroke Mother    Heart disease Father    Aneurysm Father    Aplastic anemia Sister    Heart disease Other      ROS:  Please see Reginald Reginald [name] history of present illness.   All other ROS reviewed and negative.     Physical Exam/Data: Vitals:   02/01/24 0120 02/01/24 0300 02/01/24 0814 02/01/24 1100  BP:  119/86 114/87   Pulse:  76 (!) 151 75  Resp:  20 20   Temp:  (!) 97.4 F (36.3 C) 97.9 F (36.6 C)   TempSrc:  Oral Oral   SpO2: 95% 97% 95%   Weight:  71.3 kg    Height:        Intake/Output Summary (Last 24 hours) at 02/01/2024 1220 Last data filed at 01/31/2024 2353 Gross per 24 hour  Intake 477.15 ml  Output 750 ml  Net -272.85 ml      02/01/2024    3:00 AM 01/31/2024    4:27 AM 01/30/2024    5:00 AM  Last 3 Weights  Weight (lbs) 157 lb 3 oz 167 lb 15.9 oz 196 lb 3.4 oz  Weight (kg) 71.3 kg 76.2 kg 89 kg     Body mass index is 23.9 kg/m.  General: Frail, chronically ill-appearing older male on high flow nasal cannula HEENT: normal Neck: no JVD Vascular:  No carotid bruits; Distal pulses 2+ bilaterally Cardiac:  normal S1, S2; irregularly irregular; no murmur  Lungs: Diffuse dry crackles. Abd: soft, nontender, no hepatomegaly  Ext: Right AKA Skin: warm and dry  Neuro: no focal abnormalities noted Psych:  Normal affect   EKG:  Reginald Reginald [name] EKG was personally reviewed and demonstrates:  EKG 02/01/2024 0714 Appears to be sinus tachycardia with frequent PACs though suspicious for underlying atrial [name] Telemetry:  Telemetry was personally reviewed and demonstrates:  Atrial [name] with intermittent episodes  of rvr, mostly rate controlled   Relevant CV Studies:  Echo: 11/2023  Summary   1. Reginald Reginald [name] left ventricle is normal in size with mildly increased wall  thickness.   2. Reginald Reginald [name] left ventricular systolic function is severely decreased, LVEF is  visually estimated at 30%.    3. Left ventricular segmental wall motion is normal.    4. Abnormal ventricular septal motion consistent with RV pressure and volume  overload.   5. Reginald Reginald [name] right ventricle is severely dilated in size, with severely reduced  systolic function.    6. Reginald Reginald [name] left atrium is normal in size.    7. Reginald Reginald [name] right atrium is severely dilated in size.    8. Reginald Reginald [name] aortic valve is trileaflet with mildly thickened leaflets with mildly  reduced excursion.    9. Reginald Reginald [name] mitral valve leaflets are mildly thickened with mildly reduced  leaflet mobility.    10. There is trivial mitral valve regurgitation.    11. There is mild to moderate tricuspid regurgitation.    12. There is moderate pulmonic regurgitation.    13. Severe pulmonary hypertension, estimated pulmonary arterial systolic  pressure is 71 mmHg.    14. Reginald Reginald [name] aorta at Reginald Reginald [name] ascending aorta is mildly dilated.    15. IVC size and inspiratory change suggest elevated right atrial pressure.  (10-20 mmHg).    16. Prior study from 03/24/2023.    Left Ventricle    Reginald Reginald [name] left ventricle is normal in size with mildly increased wall thickness.  Reginald Reginald [name] left ventricular systolic function is severely decreased, LVEF is visually  estimated at 30%. Reginald Reginald [name] left ventricular ejection fraction was quantified (MOD  bi-plane) at 32 %. Reginald Reginald [name] left ventricular diastolic function is indeterminate. A  false chordae is visualized in Reginald Reginald [name] left ventricle. Left ventricular segmental  wall motion is normal.   Right Ventricle    Reginald Reginald [name] right ventricle is severely dilated in size, with severely reduced  systolic function. A prominent moderator band is seen in Reginald Reginald [name] right ventricle.   Ventricular Septum    Abnormal ventricular septal motion  consistent with RV pressure and volume  overload.    Left Atrium    Reginald Reginald [name] left atrium is normal in size.   Right Atrium    Reginald Reginald [name] right atrium is severely dilated in size.    Aortic Valve    Reginald Reginald [name] aortic valve is trileaflet with mildly thickened leaflets with mildly  reduced excursion. There is no evidence of a significant transvalvular  gradient. There is no significant aortic regurgitation.   Mitral Valve    Reginald Reginald [name] mitral valve leaflets are mildly thickened with mildly reduced leaflet  mobility. Mitral annular calcification is present (mild). There is trivial  mitral valve regurgitation.   Tricuspid Valve    Reginald Reginald [name] tricuspid valve leaflets are normal, with normal leaflet mobility. There  is mild to moderate tricuspid regurgitation. Severe pulmonary hypertension,  estimated pulmonary arterial systolic pressure is 71 mmHg.   Pulmonic Valve    Reginald Reginald [name] pulmonic valve is poorly visualized, but  probably normal. There is  moderate pulmonic regurgitation. There is no evidence of a significant  transvalvular gradient.    Aorta   Reginald Reginald [name] aorta at Reginald Reginald [name] ascending aorta is mildly dilated.   Inferior Vena Cava    IVC size and inspiratory change suggest elevated right atrial pressure.  (10-20 mmHg).   Pericardium/Pleural   There is no pericardial effusion.   Laboratory Data: High Sensitivity Troponin:  No results for input(s): TROPONINIHS in Reginald Reginald [name] last 720 hours. No results for input(s): TRNPT in Reginald Reginald [name] last 720 hours.    Chemistry Recent Labs  Lab 01/29/24 1849 01/30/24 0340 01/31/24 0323 02/01/24 0336  NA 137 135 137 135  K 4.2 4.3 4.1 4.8  CL 100 102 104 101  CO2 24 23 21* 26  GLUCOSE 81 77 125* 128*  BUN 15 16 21 21   CREATININE 0.67 0.54* 0.54* 0.69  CALCIUM  8.9 8.9 8.7* 9.3  MG 1.7  --  1.9  --   GFRNONAA >60 >60 >60 >60  ANIONGAP 13 11 11 8     No results for input(s): PROT, ALBUMIN , AST, ALT, ALKPHOS, BILITOT in Reginald Reginald [name] last 168 hours. Lipids No results for input(s): CHOL,  TRIG, HDL, LABVLDL, LDLCALC, CHOLHDL in Reginald Reginald [name] last 168 hours.  Hematology Recent Labs  Lab 01/30/24 1541 01/31/24 0323 02/01/24 0336  WBC 15.5* 11.3* 14.7*  RBC 3.91* 3.96* 4.07*  HGB 9.8* 10.2* 10.3*  HCT 34.2* 34.0* 35.4*  MCV 87.5 85.9 87.0  MCH 25.1* 25.8* 25.3*  MCHC 28.7* 30.0 29.1*  RDW 16.6* 16.4* 16.4*  PLT 279 236 322   Thyroid No results for input(s): TSH, FREET4 in Reginald Reginald [name] last 168 hours.  BNPNo results for input(s): BNP, PROBNP in Reginald Reginald [name] last 168 hours.  DDimer No results for input(s): DDIMER in Reginald Reginald [name] last 168 hours.  Radiology/Studies:  DG Chest Port 1 View Result Date: 01/30/2024 EXAM: 1 VIEW(S) XRAY OF Reginald Reginald [name] CHEST 01/30/2024 03:04:00 PM COMPARISON: None available. CLINICAL HISTORY: Reginald Reginald [name] Reginald [name] has respiratory failure. ICD10: 33498 - Respiratory failure (HCC). FINDINGS: LINES, TUBES AND DEVICES: ETT in place with tip 3.5 cm above carina. LUNGS AND PLEURA: Chronic reticular interstitial opacities are again noted with a lower lung zone predominance. Patchy opacities within Reginald Reginald [name] left mid and left lower lung are noted, which may reflect chronic scarring, atelectasis, or airspace disease. Indeterminate without prior imaging for comparison. No pleural effusion. No pneumothorax. HEART AND MEDIASTINUM: Mild cardiomegaly with central vascular prominence. Sternotomy sutures and post CABG changes noted. Calcifications of aortic arch. BONES AND SOFT TISSUES: Osteopenia. No acute osseous abnormality. IMPRESSION: 1. Endotracheal tube in place with tip approximately 3.5 cm above Reginald Reginald [name] carina. 2. Chronic reticular interstitial opacities with lower lung zone predominance and superimposed patchy left mid and lower lung opacities. Reginald Reginald [name] patchy opacities are nonspecific and may reflect areas of chronic scarring, atelectasis, or superimposed infection. 3. Mild cardiomegaly with central vascular prominence, which may reflect mild pulmonary vascular congestion. Electronically signed by: Waddell Calk MD 01/30/2024 05:04 PM EST RP Workstation: [id number]     Assessment and Plan:  Reginald Reginald [name] Reginald [name] is a 89 y.o. male with a hx of CAD s/p 3v CABG '21, ICM (LVEF of 25%, severe RV dysfunction), PAD, HTN, DM, pulmonary HTN, ILD on [place] @ 5L baseline and right foot gangrene on antibiotics who is being seen 02/01/2024 for Reginald Reginald [name] Reginald [name] at Reginald Reginald [name] request of Dr. Leotis.  New onset atrial [name] -- Follows with cardiology through [facility] but denies any history of atrial [name]/fibrillation -- Rates have  been generally controlled, mildly elevated at times though suspect this may to be in Reginald Reginald [name] setting of pain -- ChadsVasc score of 6, therefore would recommend starting OAC, Eliquis  5mg  BID if ok from a vascular standpoint -- for now would plan for rate controlling strategy with 3 weeks of anticoagulation.  At that point then could consider outpatient cardioversion.  Would like to defer Reginald Reginald [name] need for TEE given his baseline pulmonary status, requires 5 L at baseline, currently on high flow nasal cannula.  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 Suspect mixed picture. Did have mild vascular congestion on admission. Remains on HFNC  -- give IV lasix  40mg  x1 now, follow response -- GDMT: PTA meds include Jardiance, Lasix  40 mg daily, losartan  25 mg daily, Toprol  XL 25 mg daily -- repeat echo   CAD s/p 3v CABG '21 -- no anginal symptoms -- on ASA, statin PTA    Risk Assessment/Risk Scores:       [place]  Heart Association (NYHA)  Functional Class NYHA Class II, mixed etiology with pulmonary HTN  CHA2DS2-VASc Score = 6   This indicates a 9.7% annual risk of stroke. Reginald Reginald [name] Reginald [name]'s score is based upon: CHF History: 1 HTN History: 1 Diabetes History: 1 Stroke History: 0 Vascular Disease History: 1 Age Score: 2 Gender Score: 0        For questions or updates, please contact [facility] Please consult [email or web address] for contact info under      Signed, Manuelita Rummer, NP  02/01/2024 12:20 PM  Reginald [name] seen and examined, note reviewed with Reginald Reginald [name] signed Advanced Practice Provider. I personally reviewed laboratory data, imaging studies and relevant notes. I independently examined Reginald Reginald [name] Reginald [name] and formulated Reginald Reginald [name] important aspects of Reginald Reginald [name] plan. I have personally discussed Reginald Reginald [name] plan with Reginald Reginald [name] Reginald [name] and/or family. Comments or changes to Reginald Reginald [name] note/plan are indicated below.  Reginald Reginald [name] Reginald [name]: This is an 89 year old male who follows with cardiology at [facility] with history of CAD s/p triple-vessel CABG in 2021, ischemic cardiomyopathy with RV dysfunction, PAD, hypertension, diabetes, pulm hypertension, interstitial pneumonia with autoimmune features who was admitted with right foot gangrene.  Cardiology was consulted for new onset atrial [name].  Currently without any chest pain or palpitations.  My Exam:  Physical Exam Vitals and nursing note reviewed.  Constitutional:      Appearance: Normal appearance.  HENT:     Head: Normocephalic and atraumatic.  Eyes:     Conjunctiva/sclera: Conjunctivae normal.  Neck:     Vascular: Hepatojugular reflux and JVD present.  Cardiovascular:     Rate and Rhythm: Normal rate and regular rhythm.  Pulmonary:     Effort: Pulmonary effort is normal.     Breath sounds: Rales present.  Neurological:     Mental Status: He is alert.      Telemetry: Atrial [name] with variable block- Personally reviewed EKG: Atrial [name] with RBBB and PVCs- Personally reviewed   Assessment  & Plan:  New onset atrial [name]-CHA2DS2-VASc: 6.  Would manage with rate control and Eliquis  5 mg twice daily for stroke prophylaxis.  Ideally would pursue cardioversion after 3 weeks of anticoagulation  Acute on chronic HFrEF-Will check an echo and diuresis Right foot gangrene status post AKA with severe peripheral arterial disease, POD 2 Acute on chronic hypoxic respiratory failure, likely multifactorial: IPF and heart failure CAD s/p CABG   Signed, Emeline Calender, DO [place]  [facility]  02/01/2024 1:45 PM           [1]  Allergies Allergen Reactions   Naproxen Itching and Rash   Roxicet [Oxycodone-Acetaminophen ] Itching and Rash   "

## 2024-02-01 NOTE — Progress Notes (Addendum)
 " PROGRESS NOTE    Reginald [name]  FMW:[id number] DOB: 1935/03/11 DOA: 01/29/2024 PCP: System, Provider Not In   Brief Narrative:  This 89 years old male with history of type 2 diabetes, coronary artery disease status post CABG, interstitial lung disease with chronic hypoxic respiratory failure on 5 L of oxygen at baseline, chronic systolic heart failure with reduced ejection fraction with last known ejection fraction of 25 to 30% and severe peripheral artery disease with right foot gangrene who has recently completed antibiotics for right foot infection 3 weeks back presented to the hospital with worsening pain and redness of the right foot. Patient has noticed worsening discoloration of the toes and pain was constantly present even at rest.  Patient was admitted for further evaluation.  Vascular surgery Dr. Silver has evaluated the patient and recommended right AKA.  Postoperatively patient was moved to ICU for observation.  TRH pickup 02/01/2024.  Right AKA appears to be healing well.  Patient cleared from vascular surgery for CIR.  Assessment & Plan:   Principal Problem:   Foot infection Active Problems:   Gangrene of right foot (HCC)   DM II (diabetes mellitus, type II), controlled (HCC)   HFrEF (heart failure with reduced ejection fraction) (HCC)   On mechanically assisted ventilation (HCC)   Acute respiratory failure with hypoxia (HCC)   ILD (interstitial lung disease) (HCC)   Acute on chronic heart failure with preserved ejection fraction (HFpEF) (HCC)   Acute on chronic respiratory failure with hypoxia (HCC)  Right Foot gangrene , status post AKA: Severe PAD: Patient is status post right AKA. POD # 2. Right AKA appears to be healing well. ID states continue antibiotics 24 hours post amputation then stop. PT and OT recommended CIR.  Acute on chronic hypoxic respiratory failure due to IPF: Patient follows up at [facility] pulmonary clinic. At baseline he is on 5 L of supplemental oxygen now  requiring heated high flow. Continue to wean as tolerated. Hold CellCept, continue increased dose of prednisone . Continue bronchodilators, Brovana , Yupelri . Continued on diuresis.  Acute on chronic HFr EF: Hold diuresis, Appears euvolemic now. Continue telemonitoring Losartan , Jardiance, Toprol  on hold.  CAD status post CABG: Continue aspirin , Lipitor, gemfibrozil , metoprolol .   Losartan  is on hold.  GERD: Continue pantoprazole .  DM II: Continue Lantus  10 units daily Continue sliding scale.  Anemia of chronic disease: H&H remains stable.  No obvious visible bleeding.  Leukocytosis: Likely due to prednisone .  Sinus tachycardia with premature ventricular contractions: Patient heart rate jumped up to 150s, EKG shows ST tach with PVCs, RBBB. Metoprolol  resumed 25 mg daily.  Tele showed A.fib Cardio consulted.    DVT prophylaxis: Heparin  Code Status: Full code Family Communication: Wife at bedside Disposition Plan:    Status is: Inpatient Remains inpatient appropriate because: Patient admitted for ischemic and gangrenous right foot ,underwent right AKA POD # 2.  Patient is now awaiting CIR.    Consultants:  Vascular surgery Infectious diseases  Procedures: Status post right AKA Antimicrobials:  Anti-infectives (From admission, onward)    Start     Dose/Rate Route Frequency Ordered Stop   02/02/24 0900  sulfamethoxazole -trimethoprim  (BACTRIM  DS) 800-160 MG per tablet 1 tablet        1 tablet Oral Once per day on Monday Wednesday Friday 02/01/24 0832     01/30/24 1000  vancomycin  (VANCOREADY) IVPB 1500 mg/300 mL        1,500 mg 150 mL/hr over 120 Minutes Intravenous Every 24 hours 01/29/24 2001  01/29/24 2100  ceFEPIme  (MAXIPIME ) 2 g in sodium chloride  0.9 % 100 mL IVPB        2 g 200 mL/hr over 30 Minutes Intravenous Every 8 hours 01/29/24 2001        Subjective: Patient was seen and examined at bedside.  Overnight events noted. Patient seems much  improved.  He appears euvolemic.  Heart rate is now controlled. Status post right AKA POD #3.  Wound is healing well.  Objective: Vitals:   01/31/24 2300 02/01/24 0120 02/01/24 0300 02/01/24 0814  BP: 102/67  119/86 114/87  Pulse: 75  76 (!) 151  Resp: 20  20 20   Temp: 98.2 F (36.8 C)  (!) 97.4 F (36.3 C) 97.9 F (36.6 C)  TempSrc: Oral  Oral Oral  SpO2: 93% 95% 97% 95%  Weight:   71.3 kg   Height:        Intake/Output Summary (Last 24 hours) at 02/01/2024 1040 Last data filed at 01/31/2024 2353 Gross per 24 hour  Intake 477.15 ml  Output 750 ml  Net -272.85 ml   Filed Weights   01/30/24 0500 01/31/24 0427 02/01/24 0300  Weight: 89 kg 76.2 kg 71.3 kg    Examination:  General exam: Appears calm and comfortable, not in any acute distress. Respiratory system: Clear to auscultation. Respiratory effort normal.  RR 14 Cardiovascular system: S1 & S2 heard, RRR. No JVD, murmurs, rubs, gallops or clicks. No pedal edema. Gastrointestinal system: Abdomen is non distended, soft and non tender.  Normal bowel sounds heard. Central nervous system: Alert and oriented X 3. No focal neurological deficits. Extremities: Status post right AKA. POD # 2 Skin: No rashes, lesions or ulcers Psychiatry: Judgement and insight appear normal. Mood & affect appropriate.    Data Reviewed: I have personally reviewed following labs and imaging studies  CBC: Recent Labs  Lab 01/29/24 1849 01/30/24 0340 01/30/24 1541 01/31/24 0323 02/01/24 0336  WBC 15.2* 13.1* 15.5* 11.3* 14.7*  HGB 11.1* 11.0* 9.8* 10.2* 10.3*  HCT 37.8* 36.2* 34.2* 34.0* 35.4*  MCV 86.1 85.2 87.5 85.9 87.0  PLT 268 269 279 236 322   Basic Metabolic Panel: Recent Labs  Lab 01/29/24 1849 01/30/24 0340 01/31/24 0323 02/01/24 0336  NA 137 135 137 135  K 4.2 4.3 4.1 4.8  CL 100 102 104 101  CO2 24 23 21* 26  GLUCOSE 81 77 125* 128*  BUN 15 16 21 21   CREATININE 0.67 0.54* 0.54* 0.69  CALCIUM  8.9 8.9 8.7* 9.3  MG  1.7  --  1.9  --   PHOS 3.7  --   --   --    GFR: Estimated Creatinine Clearance: 64.1 mL/min (by C-G formula based on SCr of 0.69 mg/dL). Liver Function Tests: No results for input(s): AST, ALT, ALKPHOS, BILITOT, PROT, ALBUMIN  in the last 168 hours. No results for input(s): LIPASE, AMYLASE in the last 168 hours. No results for input(s): AMMONIA in the last 168 hours. Coagulation Profile: Recent Labs  Lab 01/29/24 1849 01/30/24 0340  INR 1.3* 1.3*   Cardiac Enzymes: No results for input(s): CKTOTAL, CKMB, CKMBINDEX, TROPONINI in the last 168 hours. BNP (last 3 results) No results for input(s): PROBNP in the last 8760 hours. HbA1C: Recent Labs    01/29/24 1849  HGBA1C 6.8*   CBG: Recent Labs  Lab 01/31/24 1856 01/31/24 2005 02/01/24 0027 02/01/24 0407 02/01/24 0813  GLUCAP 291* 294* 132* 141* 172*   Lipid Profile: No results for input(s): CHOL, HDL,  LDLCALC, TRIG, CHOLHDL, LDLDIRECT in the last 72 hours. Thyroid Function Tests: No results for input(s): TSH, T4TOTAL, FREET4, T3FREE, THYROIDAB in the last 72 hours. Anemia Panel: No results for input(s): VITAMINB12, FOLATE, FERRITIN, TIBC, IRON, RETICCTPCT in the last 72 hours. Sepsis Labs: No results for input(s): PROCALCITON, LATICACIDVEN in the last 168 hours.  Recent Results (from the past 240 hours)  MRSA Next Gen by PCR, Nasal     Status: None   Collection Time: 01/30/24 10:56 AM   Specimen: Nasal Mucosa; Nasal Swab  Result Value Ref Range Status   MRSA by PCR Next Gen NOT DETECTED NOT DETECTED Final    Comment: (NOTE) The GeneXpert MRSA Assay (FDA approved for NASAL specimens only), is one component of a comprehensive MRSA colonization surveillance program. It is not intended to diagnose MRSA infection nor to guide or monitor treatment for MRSA infections. Test performance is not FDA approved in patients less than 25 years old. Performed at  [facility], [address], [place], [place] [address]     Radiology Studies: DG Chest Port 1 View Result Date: 01/30/2024 EXAM: 1 VIEW(S) XRAY OF THE CHEST 01/30/2024 03:04:00 PM COMPARISON: None available. CLINICAL HISTORY: The patient has respiratory failure. ICD10: 33498 - Respiratory failure (HCC). FINDINGS: LINES, TUBES AND DEVICES: ETT in place with tip 3.5 cm above carina. LUNGS AND PLEURA: Chronic reticular interstitial opacities are again noted with a lower lung zone predominance. Patchy opacities within the left mid and left lower lung are noted, which may reflect chronic scarring, atelectasis, or airspace disease. Indeterminate without prior imaging for comparison. No pleural effusion. No pneumothorax. HEART AND MEDIASTINUM: Mild cardiomegaly with central vascular prominence. Sternotomy sutures and post CABG changes noted. Calcifications of aortic arch. BONES AND SOFT TISSUES: Osteopenia. No acute osseous abnormality. IMPRESSION: 1. Endotracheal tube in place with tip approximately 3.5 cm above the carina. 2. Chronic reticular interstitial opacities with lower lung zone predominance and superimposed patchy left mid and lower lung opacities. The patchy opacities are nonspecific and may reflect areas of chronic scarring, atelectasis, or superimposed infection. 3. Mild cardiomegaly with central vascular prominence, which may reflect mild pulmonary vascular congestion. Electronically signed by: Taylor Stroud MD 01/30/2024 05:04 PM EST RP Workstation: [id number]   Scheduled Meds:  acetaminophen   650 mg Oral TID   arformoterol   15 mcg Nebulization BID   ascorbic acid   500 mg Oral Q1400   aspirin   81 mg Oral Daily   atorvastatin   40 mg Oral Q lunch   Chlorhexidine  Gluconate Cloth  6 each Topical Daily   cholecalciferol   2,000 Units Oral Q1400   fenofibrate   54 mg Oral Daily   gabapentin   300 mg Oral BID   heparin   5,000 Units Subcutaneous Q8H   insulin  aspart  3-9 Units Subcutaneous  Q4H   insulin  glargine  10 Units Subcutaneous Daily   ipratropium  2 spray Each Nare TID   metoprolol  tartrate  25 mg Oral Daily   multivitamin with minerals  1 tablet Oral Daily   pantoprazole  (PROTONIX ) IV  40 mg Intravenous Daily   polyethylene glycol  17 g Oral Daily   predniSONE   40 mg Oral Q breakfast   revefenacin   175 mcg Nebulization Daily   senna  1 tablet Oral BID   sodium chloride  flush  3 mL Intravenous Q12H   [START ON 02/02/2024] sulfamethoxazole -trimethoprim   1 tablet Oral Once per day on Monday Wednesday Friday   traMADol   50 mg Oral Q6H  Continuous Infusions:  ceFEPime  (MAXIPIME ) IV 2 g (02/01/24 0511)   vancomycin  1,500 mg (02/01/24 0907)     LOS: 3 days    Time spent: 50 mins    Darcel Dawley, MD [facility]   If 7PM-7AM, please contact night-coverage  "

## 2024-02-01 NOTE — Inpatient Diabetes Management (Addendum)
 Inpatient Diabetes Program Recommendations  AACE/ADA: New Consensus Statement on Inpatient Glycemic Control (2015)  Target Ranges:  Prepandial:   less than 140 mg/dL      Peak postprandial:   less than 180 mg/dL (1-2 hours)      Critically ill patients:  140 - 180 mg/dL   Lab Results  Component Value Date   GLUCAP 129 (H) 02/01/2024   HGBA1C 6.8 (H) 01/29/2024    Review of Glycemic Control  Latest Reference Range & Units 02/01/24 00:27 02/01/24 04:07 02/01/24 08:13 02/01/24 12:16  Glucose-Capillary 70 - 99 mg/dL 867 (H) 858 (H) 827 (H) 129 (H)   Diabetes history: DM 2 Outpatient Diabetes medications:  Jardiance 10 mg daily Metformin  1000 mg bid  Current orders for Inpatient glycemic control:  Novolog  3-9 units q 4 hours Lantus  10 units daily Prednisone  40 mg daily Inpatient Diabetes Program Recommendations:    Please consider changing  Novolog  correction to sensitive tid with meals and HS scale and consider adding Novolog  meal coverage 3 units tid with meals while on steroids (hold if patient eats less than 50% or NPO).    Thanks,  Randall Bullocks, RN, BC-ADM Inpatient Diabetes Coordinator Pager [phone]  (8a-5p)

## 2024-02-02 ENCOUNTER — Other Ambulatory Visit (HOSPITAL_COMMUNITY): Payer: Self-pay

## 2024-02-02 ENCOUNTER — Telehealth (HOSPITAL_COMMUNITY): Payer: Self-pay | Admitting: Pharmacy Technician

## 2024-02-02 DIAGNOSIS — L089 Local infection of the skin and subcutaneous tissue, unspecified: Secondary | ICD-10-CM | POA: Diagnosis not present

## 2024-02-02 LAB — BASIC METABOLIC PANEL WITH GFR
Anion gap: 9 (ref 5–15)
BUN: 25 mg/dL — ABNORMAL HIGH (ref 8–23)
CO2: 24 mmol/L (ref 22–32)
Calcium: 9 mg/dL (ref 8.9–10.3)
Chloride: 99 mmol/L (ref 98–111)
Creatinine, Ser: 0.77 mg/dL (ref 0.61–1.24)
GFR, Estimated: >60 mL/min
Glucose, Bld: 142 mg/dL — ABNORMAL HIGH (ref 70–99)
Potassium: 4.6 mmol/L (ref 3.5–5.1)
Sodium: 132 mmol/L — ABNORMAL LOW (ref 135–145)

## 2024-02-02 LAB — CBC
HCT: 34.3 % — ABNORMAL LOW (ref 39.0–52.0)
Hemoglobin: 10.2 g/dL — ABNORMAL LOW (ref 13.0–17.0)
MCH: 25.6 pg — ABNORMAL LOW (ref 26.0–34.0)
MCHC: 29.7 g/dL — ABNORMAL LOW (ref 30.0–36.0)
MCV: 86.2 fL (ref 80.0–100.0)
Platelets: 313 K/uL (ref 150–400)
RBC: 3.98 MIL/uL — ABNORMAL LOW (ref 4.22–5.81)
RDW: 16.5 % — ABNORMAL HIGH (ref 11.5–15.5)
WBC: 13.5 K/uL — ABNORMAL HIGH (ref 4.0–10.5)
nRBC: 0 % (ref 0.0–0.2)

## 2024-02-02 LAB — HEPARIN LEVEL (UNFRACTIONATED)
Heparin Unfractionated: 0.29 [IU]/mL — ABNORMAL LOW (ref 0.30–0.70)
Heparin Unfractionated: 0.4 [IU]/mL (ref 0.30–0.70)

## 2024-02-02 LAB — GLUCOSE, CAPILLARY
Glucose-Capillary: 117 mg/dL — ABNORMAL HIGH (ref 70–99)
Glucose-Capillary: 169 mg/dL — ABNORMAL HIGH (ref 70–99)
Glucose-Capillary: 242 mg/dL — ABNORMAL HIGH (ref 70–99)
Glucose-Capillary: 170 mg/dL — ABNORMAL HIGH (ref 70–99)

## 2024-02-02 LAB — MAGNESIUM: Magnesium: 2.1 mg/dL (ref 1.7–2.4)

## 2024-02-02 LAB — PHOSPHORUS: Phosphorus: 2.1 mg/dL — ABNORMAL LOW (ref 2.5–4.6)

## 2024-02-02 MED ORDER — K PHOS MONO-SOD PHOS DI & MONO 155-852-130 MG PO TABS
250.0000 mg | ORAL_TABLET | Freq: Two times a day (BID) | ORAL | Status: DC
  Administered 2024-02-02 (×2): 250 mg via ORAL
  Filled 2024-02-02 (×3): qty 1

## 2024-02-02 MED ORDER — METOPROLOL SUCCINATE ER 25 MG PO TB24
25.0000 mg | ORAL_TABLET | Freq: Every day | ORAL | Status: DC
  Administered 2024-02-02 – 2024-02-06 (×5): 25 mg via ORAL
  Filled 2024-02-02 (×6): qty 1

## 2024-02-02 MED ORDER — LOSARTAN POTASSIUM 25 MG PO TABS
25.0000 mg | ORAL_TABLET | Freq: Every day | ORAL | Status: DC
  Administered 2024-02-02 – 2024-02-06 (×3): 25 mg via ORAL
  Filled 2024-02-02 (×6): qty 1

## 2024-02-02 MED ORDER — APIXABAN 5 MG PO TABS
5.0000 mg | ORAL_TABLET | Freq: Two times a day (BID) | ORAL | Status: DC
  Administered 2024-02-02 – 2024-02-14 (×25): 5 mg via ORAL
  Filled 2024-02-02 (×25): qty 1

## 2024-02-02 MED ORDER — FUROSEMIDE 10 MG/ML IJ SOLN
40.0000 mg | Freq: Once | INTRAMUSCULAR | Status: AC
  Administered 2024-02-02: 40 mg via INTRAVENOUS
  Filled 2024-02-02: qty 4

## 2024-02-02 NOTE — Progress Notes (Signed)
 Orthopedic Tech Progress Note Patient Details:  Reginald [name] 1936/01/03 [id number]  Patient ID: Reginald [name], male   DOB: [birth year], 89 y.o.   MRN: [id number] Call to [employer] for RLE AKA retention sock.    Karelyn Brisby OTR/L 02/02/2024, 2:28 PM

## 2024-02-02 NOTE — Progress Notes (Signed)
" °   02/02/24 1309  Assess: MEWS Score  BP 100/71  Pulse Rate (!) 110  Assess: MEWS Score  MEWS Temp 0  MEWS Systolic 1  MEWS Pulse 1  MEWS RR 0  MEWS LOC 0  MEWS Score 2  MEWS Score Color Yellow  Assess: if the MEWS score is Yellow or Red  Were vital signs accurate and taken at a resting state? Yes  Does the patient meet 2 or more of the SIRS criteria? Yes  Does the patient have a confirmed or suspected source of infection? Yes  MEWS guidelines implemented  Yes, yellow  Treat  MEWS Interventions Considered administering scheduled or prn medications/treatments as ordered  Take Vital Signs  Increase Vital Sign Frequency  Yellow: Q2hr x1, continue Q4hrs until patient remains green for 12hrs  Escalate  MEWS: Escalate Yellow: Discuss with charge nurse and consider notifying provider and/or RRT  Assess: SIRS CRITERIA  SIRS Temperature  0  SIRS Respirations  0  SIRS Pulse 1  SIRS WBC 1  SIRS Score Sum  2    "

## 2024-02-02 NOTE — Progress Notes (Signed)
 PHARMACY - ANTICOAGULATION CONSULT NOTE  Pharmacy Consult for heparin  Indication: atrial fibrillation  Labs: Recent Labs    01/30/24 0340 01/30/24 1541 01/31/24 0323 02/01/24 0336 02/02/24 0204  HGB 11.0*   < > 10.2* 10.3* 10.2*  HCT 36.2*   < > 34.0* 35.4* 34.3*  PLT 269   < > 236 322 313  APTT 46*  --   --   --   --   LABPROT 16.9*  --   --   --   --   INR 1.3*  --   --   --   --   HEPARINUNFRC  --   --   --   --  0.29*  CREATININE 0.54*  --  0.54* 0.69  --    < > = values in this interval not displayed.   Assessment: 89yo male slightly subtherapeutic on heparin  with initial dosing for Afib; no infusion issues or signs of bleeding per RN.  Goal of Therapy:  Heparin  level 0.3-0.7 units/ml   Plan:  Increase heparin  infusion slightly to 1100 units/hr. Check level in 8 hours.   Marvetta Dauphin, PharmD, BCPS 02/02/2024 3:02 AM

## 2024-02-02 NOTE — Progress Notes (Addendum)
 Physical Therapy Treatment Patient Details Name: Reginald [name] MRN: [id number] DOB: [birth year] Today's Date: 02/02/2024   History of Present Illness Pt is a 89 y.o. male who presented due to R foot pain. 1/13 s/p R AKA PMH: DM2, CAD s/p CABG, chronic hypoxic respiratory failure on 5L o2, HF with reduced EF 25-30%.   PT Comments  Pt received in supine with wife present and agreeable to PT session. Pt progressing well towards acute PT goals with less assistance required for mobility. Pt required ModA/MinA for bed mobility and MinA/CGA for seated balance. Pt initially had a posterior lean with improvements throughout the session. Unable to transfer to the recliner as it was broken, with pt able to work on lateral scoots towards [facility] with MinA +2 for safety. Also worked on dynamic seated balance with reaching activities and core activation exercise. Gave HEP at end of session with discussion on residual limb tapping and desensitization techniques. Pt continues to be an excellent candidate for >3hrs post acute rehab. Acute PT to follow.  97-98% SpO2 on 6L HR -80s at rest        143-148 BPM with mobility    If plan is discharge home, recommend the following: A lot of help with walking and/or transfers;A lot of help with bathing/dressing/bathroom;Assistance with cooking/housework;Direct supervision/assist for medications management;Direct supervision/assist for financial management;Assist for transportation;Help with stairs or ramp for entrance   Can travel by private vehicle      No  Equipment Recommendations  Wheelchair (measurements PT);Wheelchair cushion (measurements PT);Hospital bed;Hoyer lift    Recommendations for Other Services Rehab consult     Precautions / Restrictions Precautions Precautions: Fall Recall of Precautions/Restrictions: Intact Precaution/Restrictions Comments: watch HR Restrictions Weight Bearing Restrictions Per Provider Order: Yes RLE Weight Bearing Per Provider Order:  Non weight bearing     Mobility  Bed Mobility Overal bed mobility: Needs Assistance Bed Mobility: Supine to Sit, Sit to Supine    Supine to sit: Mod assist, HOB elevated, Used rails Sit to supine: Min assist, HOB elevated   General bed mobility comments: able to bring LE's off EOB with ModA to raise trunk. Cueing throughout for technique. MinA for return to supine for BLE management    Transfers Overall transfer level: Needs assistance Equipment used: None Transfers: Bed to chair/wheelchair/BSC     Lateral/Scoot Transfers: Min assist, +2 safety/equipment General transfer comment: MinA +2 for safety to laterally scoot towards HOB. Short rest breaks in between scoots 2/2 tachycardia and SOB.     Balance Overall balance assessment: Needs assistance Sitting-balance support: Bilateral upper extremity supported, Feet supported Sitting balance-Leahy Scale: Poor Sitting balance - Comments: Initially MinA due to posterior lean with progression to CGA/supervision with time Postural control: Posterior lean     Communication Communication Communication: Impaired Factors Affecting Communication: Hearing impaired  Cognition Arousal: Alert Behavior During Therapy: WFL for tasks assessed/performed   PT - Cognitive impairments: No apparent impairments    Following commands: Intact      Cueing Cueing Techniques: Verbal cues, Visual cues  Exercises Amputee Exercises Hip ABduction/ADduction: AROM, Right, 10 reps, Supine Straight Leg Raises: AROM, Right, 10 reps, Supine Other Exercises Other Exercises: Seated balance exercises: lateral trunk rotations, reaching anterior and across body for objects, and A/P modified sit-ups with no UE support (ModA posterior assist)    General Comments General comments (skin integrity, edema, etc.): Gave HEP: glute sets, sidelying hip ABD, prone hip ext, supine SLR, chair push-up, tapping education and desensitization techniques  Pertinent  Vitals/Pain Pain Assessment Pain Assessment: No/denies pain     PT Goals (current goals can now be found in the care plan section) Acute Rehab PT Goals PT Goal Formulation: With patient Time For Goal Achievement: 02/14/24 Potential to Achieve Goals: Good Progress towards PT goals: Progressing toward goals    Frequency    Min 2X/week           Co-evaluation   Reason for Co-Treatment: For patient/therapist safety;Complexity of the patient's impairments (multi-system involvement) PT goals addressed during session: Mobility/safety with mobility;Balance;Proper use of DME        AM-PAC PT 6 Clicks Mobility   Outcome Measure  Help needed turning from your back to your side while in a flat bed without using bedrails?: A Little Help needed moving from lying on your back to sitting on the side of a flat bed without using bedrails?: A Lot Help needed moving to and from a bed to a chair (including a wheelchair)?: A Lot Help needed standing up from a chair using your arms (e.g., wheelchair or bedside chair)?: Total Help needed to walk in hospital room?: Total Help needed climbing 3-5 steps with a railing? : Total 6 Click Score: 10    End of Session Equipment Utilized During Treatment: Oxygen Activity Tolerance: Patient tolerated treatment well Patient left: in bed;with call bell/phone within reach;with family/visitor present;with bed alarm set Nurse Communication: Mobility status PT Visit Diagnosis: Unsteadiness on feet (R26.81);Muscle weakness (generalized) (M62.81)     Time: 8592-8553 PT Time Calculation (min) (ACUTE ONLY): 39 min  Charges:    $Therapeutic Exercise: 8-22 mins $Therapeutic Activity: 8-22 mins PT General Charges $$ ACUTE PT VISIT: 1 Visit                    [name], PT, DPT Secure Chat Preferred  Rehab Office [phone]   [name] 02/02/2024, 3:19 PM

## 2024-02-02 NOTE — Progress Notes (Signed)
 Patient appears to be tolerating IV heparin  well. Hemoglobin is stable. Discussed with Baglia Corrina PA-C from Vascular surgery and the patient may transition from IV heparin  to Eliquis  today.  Signed,  Morse Clause, PA-C 02/02/2024, 11:31 AM

## 2024-02-02 NOTE — Progress Notes (Addendum)
 "  Progress Note  Patient Name: Reginald [name] Date of Encounter: 02/02/2024 [facility] Cardiologist: None   Interval Summary   Patient sitting in his bed comfortably during interview.  Denies any chest pain or shortness of breath.  Patient denies having any symptoms with his atrial fibrillation.  Addressed all questions and concerns that patient had.  Vital Signs Vitals:   02/02/24 0117 02/02/24 0300 02/02/24 0825 02/02/24 0842  BP:  108/74 133/78   Pulse:  92 77   Resp:  17 18   Temp:  97.9 F (36.6 C) 97.9 F (36.6 C)   TempSrc:  Oral Oral   SpO2: 97% 96% 100% 92%  Weight:  77.1 kg    Height:        Intake/Output Summary (Last 24 hours) at 02/02/2024 0938 Last data filed at 02/01/2024 2146 Gross per 24 hour  Intake 3 ml  Output 2350 ml  Net -2347 ml      02/02/2024    3:00 AM 02/01/2024    3:00 AM 01/31/2024    4:27 AM  Last 3 Weights  Weight (lbs) 169 lb 15.6 oz 157 lb 3 oz 167 lb 15.9 oz  Weight (kg) 77.1 kg 71.3 kg 76.2 kg      Telemetry/ECG  Atrial flutter with ventricular heart rates in the 80s to 100s and variable AV conduction- Personally Reviewed  Physical Exam  GEN: No acute distress.  Alert and orientated on room air Neck: No JVD Cardiac: Irregular rhythm, no murmurs, rubs, or gallops.  Respiratory: Diffuse wheezing heard throughout the lungs GI: Soft, nontender, non-distended  MS: No edema  Assessment & Plan   Reginald [name] is a 89 y.o. male with a hx of CAD s/p 3v CABG 2021, ICM (LVEF of 25%, severe RV dysfunction), PAD, HTN, DM, pulmonary HTN, ILD on [place] 5L baseline who is being seen for atrial flutter.  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 There are currently plans to discharge the patient to CIR. Management per primary  New onset atrial flutter Patient denies any prior history of atrial flutter or atrial fibrillation.  Patient is a poor  candidate for a TEE given that he has on 5 L nasal cannula at baseline.  Was recommended to be on anticoagulation for 3 weeks and doing a cardioversion.  Because of concerns for bleeding the patient was initially started on IV heparin .  Labs this morning showed stable anemia with hemoglobin of 10.2 Stop metoprolol  titrate Start metoprolol  succinate 25 mg daily Continue IV Heparin .  Patient appears to be tolerating heparin  well. Will discuss with vascular surgery. Patient lives in [place] but is planning to go to CIR at discharge.  Will be able to make determination.  Patient's preferred follow-up depends on which location they are at.  Will be able to make plans for follow-up appointment when we know how long patient will be in CIR.   Acute on chronic HFrEF Ischemic cardiomyopathy TTE on 02/01/2024 showed a reduced LVEF of 20 to 25%, global hypokinesis, G2 DD, mildly dilated left atrium, severely dilated right atrium, moderate MAC, and IVC with less than 50% respiratory variability. Patient previously had a reduced LVEF of 25-30% on 11/2023.  I's and O's are net out 2.2L since admission.  Patient's weight is documented as being down about 26 pounds since admission.  Patient appears euvolemic on exam. GDMT -prior to admission  was on Lasix  40 mg daily, Jardiance, losartan , and metoprolol  succinate. Start metoprolol  succinate 25 mg daily Restart home losartan  25 mg daily   Pulmonary hypertension RV failure Was felt to be WHO group 3.  As the patient has interstitial lung disease.  TTE on 01/22/2024 showed enlarged RV, severely reduced RV systolic function, and RV systolic pressure of 47.3 mmHg   CAD s/p CABG x3 in 2021 Hyperlipidemia Hypertriglyceridemia Continue atorvastatin  40 mg daily Continue fenofibrate  54 mg daily   Otherwise management per primary     For questions or updates, please contact [facility] Please consult [email or web address] for contact info under         Signed, Morse Clause, PA-C    Patient seen and examined, note reviewed with the signed Advanced Practice Provider. I personally reviewed laboratory data, imaging studies and relevant notes. I independently examined the patient and formulated the important aspects of the plan. I have personally discussed the plan with the patient and/or family. Comments or changes to the note/plan are indicated below.  Patient profile: This is an 89 year old male who follows with cardiology at [facility] with history of CAD s/p triple-vessel CABG in 2021, ischemic cardiomyopathy with RV dysfunction, PAD, hypertension, diabetes, pulm hypertension, interstitial pneumonia with autoimmune features who was admitted with right foot gangrene and is s/p AKA. Cardiology was consulted for new onset atrial flutter.   My Exam:  Physical Exam Vitals and nursing note reviewed.  Constitutional:      Appearance: Normal appearance.  HENT:     Head: Normocephalic and atraumatic.  Eyes:     Conjunctiva/sclera: Conjunctivae normal.  Neck:     Vascular: Hepatojugular reflux present.  Cardiovascular:     Rate and Rhythm: Normal rate. Rhythm irregular.  Pulmonary:     Effort: Pulmonary effort is normal.     Breath sounds: Rales present.  Musculoskeletal:        General: No swelling.  Skin:    Coloration: Skin is not jaundiced.  Neurological:     Mental Status: He is alert.       Assessment & Plan:  New onset atrial flutter-CHA2DS2-VASc: 6.  Would manage with rate control and Eliquis  5 mg twice daily for stroke prophylaxis.  Ideally would pursue cardioversion after 3 weeks of anticoagulation. Change metoprolol  tartrate to succinate for HF GDMT.  Acute on chronic HFrEF- known EF 20-25% and similar on echo 1/15 with Grade II diastolic dysfunction. Weight down 11 kg from admit, I/O net - 2L. On baseline 5L/min O2. Still appears volume up. Will give additional lasix  today. Advance GDMT as noted in APP note. Right foot gangrene  status post AKA with severe peripheral arterial disease, POD 3 Acute on chronic hypoxic respiratory failure, likely multifactorial: IPF and heart failure on 5 L at baseline, currently 6L/min CAD s/p CABG   Signed, Emeline Calender, DO [place]  [facility]  02/02/2024 11:54 AM     "

## 2024-02-02 NOTE — Progress Notes (Signed)
" °   02/02/24 0842  Oxygen Therapy/Pulse Ox  O2 Device (S)  HFNC  SpO2 92 %  O2 Therapy Oxygen humidified  O2 Flow Rate (L/min) (S)  6 L/min    PT was taken off of HHFNC and placed on 6 L salter [place]. Pt is comfortable with change and sating 92%. RT will monitor.  "

## 2024-02-02 NOTE — Progress Notes (Signed)
" °  Progress Note    02/02/2024 7:45 AM 3 Days Post-Op  Subjective:  no complaints   Vitals:   02/02/24 0117 02/02/24 0300  BP:  108/74  Pulse:  92  Resp:  17  Temp:  97.9 F (36.6 C)  SpO2: 97% 96%   Physical Exam: Cardiac:  regular Lungs:  on HF Spray Incisions:  Right AKA well appearing, viable skin edges. Dressings replaced Neurologic: alert and oriented   CBC    Component Value Date/Time   WBC 13.5 (H) 02/02/2024 0204   RBC 3.98 (L) 02/02/2024 0204   HGB 10.2 (L) 02/02/2024 0204   HCT 34.3 (L) 02/02/2024 0204   PLT 313 02/02/2024 0204   MCV 86.2 02/02/2024 0204   MCH 25.6 (L) 02/02/2024 0204   MCHC 29.7 (L) 02/02/2024 0204   RDW 16.5 (H) 02/02/2024 0204    BMET    Component Value Date/Time   NA 132 (L) 02/02/2024 0204   K 4.6 02/02/2024 0204   CL 99 02/02/2024 0204   CO2 24 02/02/2024 0204   GLUCOSE 142 (H) 02/02/2024 0204   BUN 25 (H) 02/02/2024 0204   CREATININE 0.77 02/02/2024 0204   CALCIUM  9.0 02/02/2024 0204   GFRNONAA >60 02/02/2024 0204   GFRAA >60 08/30/2016 1346    INR    Component Value Date/Time   INR 1.3 (H) 01/30/2024 0340     Intake/Output Summary (Last 24 hours) at 02/02/2024 0745 Last data filed at 02/01/2024 2146 Gross per 24 hour  Intake 3 ml  Output 2350 ml  Net -2347 ml     Assessment/Plan:  89 y.o. male is s/p Right AKA 3 Days Post-Op   Right AKA well appearing with viable skin edges Kerlix and ACE reapplied Will order stump sock Follow up in 4 weeks has been arranged for staple removal    Teretha Damme, PA-C [facility] [phone] 02/02/2024 7:45 AM "

## 2024-02-02 NOTE — Progress Notes (Signed)
 " PROGRESS NOTE    KNUTE MAZZUCA  FMW:[id number] DOB: 06-21-1935 DOA: 01/29/2024 PCP: System, Provider Not In   Brief Narrative:  This 89 years old male with history of type 2 diabetes, coronary artery disease status post CABG, interstitial lung disease with chronic hypoxic respiratory failure on 5 L of oxygen at baseline, chronic systolic heart failure with reduced ejection fraction with last known ejection fraction of 25 to 30% and severe peripheral artery disease with right foot gangrene who has recently completed antibiotics for right foot infection 3 weeks back presented to the hospital with worsening pain and redness of the right foot. Patient has noticed worsening discoloration of the toes and pain was constantly present even at rest.  Patient was admitted for further evaluation.  Vascular surgery Dr. Silver has evaluated the patient and recommended right AKA.  Postoperatively patient was moved to ICU for observation.  TRH pickup 02/01/2024.  Right AKA appears to be healing well.  Patient cleared from vascular surgery for CIR.  Assessment & Plan:   Principal Problem:   Foot infection Active Problems:   Gangrene of right foot (HCC)   DM II (diabetes mellitus, type II), controlled (HCC)   HFrEF (heart failure with reduced ejection fraction) (HCC)   On mechanically assisted ventilation (HCC)   Acute respiratory failure with hypoxia (HCC)   ILD (interstitial lung disease) (HCC)   Acute on chronic heart failure with preserved ejection fraction (HFpEF) (HCC)   Acute on chronic respiratory failure with hypoxia (HCC)  Right Foot gangrene , status post Right AKA: Severe PAD: Patient is status post right AKA. POD # 3. Right AKA appears to be healing well. ID states continue antibiotics 24 hours post amputation then stop. Antibiotics discontinued.  Follow-up in 4 weeks for staple removal. PT and OT recommended CIR.  Acute on chronic hypoxic respiratory failure due to IPF: Patient follows up at  [facility] pulmonary clinic. At baseline he is on 5 L of supplemental oxygen now requiring heated high flow. Continue to wean as tolerated.  He successfully weaned down to 6 L nasal cannula. Hold CellCept, continue increased dose of prednisone . Continue bronchodilators, Brovana , Yupelri . Continued on diuresis.  Acute on chronic HFr EF: Hold diuresis, Appears euvolemic now. Continue telemonitoring Resume metoprolol  and losartan . Cardiology is following  CAD status post CABG: Continue aspirin , Lipitor, gemfibrozil , metoprolol .   Resume losartan  25 mg daily.  GERD: Continue pantoprazole .  DM II: Continue Lantus  10 units daily Continue sliding scale.  Anemia of chronic disease: H&H remains stable.  No obvious visible bleeding.  Leukocytosis: Likely due to prednisone .  New onset atrial flutter: Patient heart rate jumped up to 150s, EKG shows new onset atrial flutter. Continue telemetry.  Heart rate is now well-controlled. Cardio consulted.  Continue metoprolol  succinate 25 mg daily Continue IV heparin , plan is to transition to Eliquis  today. Plan is to continue anticoagulation for 3 weeks and doing a cardioversion.   DVT prophylaxis: Heparin  Code Status: Full code Family Communication: Wife at bedside Disposition Plan:    Status is: Inpatient Remains inpatient appropriate because: Patient admitted for ischemic and gangrenous right foot , underwent right AKA POD # 3.  Patient developed new onset A-fib now heart rate controlled.  Started on anticoagulation. Patient is now awaiting CIR.    Consultants:  Vascular surgery Infectious diseases Cardiology  Procedures: Status post right AKA Antimicrobials:  Anti-infectives (From admission, onward)    Start     Dose/Rate Route Frequency Ordered Stop   02/02/24 0900  sulfamethoxazole -trimethoprim  (BACTRIM  DS) 800-160 MG per tablet 1 tablet        1 tablet Oral Once per day on Monday Wednesday Friday 02/01/24 0832     01/30/24  1000  vancomycin  (VANCOREADY) IVPB 1500 mg/300 mL  Status:  Discontinued        1,500 mg 150 mL/hr over 120 Minutes Intravenous Every 24 hours 01/29/24 2001 02/02/24 0736   01/29/24 2100  ceFEPIme  (MAXIPIME ) 2 g in sodium chloride  0.9 % 100 mL IVPB  Status:  Discontinued        2 g 200 mL/hr over 30 Minutes Intravenous Every 8 hours 01/29/24 2001 02/02/24 0736      Subjective: Patient was seen and examined at bedside.  Overnight events noted. Patient seems much improved.  He appears euvolemic.  Heart rate is now controlled. Status post right AKA POD # 3.  Wound is healing well.  Objective: Vitals:   02/02/24 0117 02/02/24 0300 02/02/24 0825 02/02/24 0842  BP:  108/74 133/78   Pulse:  92 77   Resp:  17 18   Temp:  97.9 F (36.6 C) 97.9 F (36.6 C)   TempSrc:  Oral Oral   SpO2: 97% 96% 100% 92%  Weight:  77.1 kg    Height:        Intake/Output Summary (Last 24 hours) at 02/02/2024 1141 Last data filed at 02/01/2024 2146 Gross per 24 hour  Intake 3 ml  Output 2350 ml  Net -2347 ml   Filed Weights   01/31/24 0427 02/01/24 0300 02/02/24 0300  Weight: 76.2 kg 71.3 kg 77.1 kg    Examination:  General exam: Appears calm and comfortable, not in any acute distress. Respiratory system: CTA Bilaterally. Respiratory effort normal.  RR 15 Cardiovascular system: S1 & S2 heard, Irregular rhythm, No JVD, murmurs, rubs, gallops or clicks.  Gastrointestinal system: Abdomen is non distended, soft and non tender.  Normal bowel sounds heard. Central nervous system: Alert and oriented X 3. No focal neurological deficits. Extremities: Status post right AKA. POD # 3 Skin: No rashes, lesions or ulcers Psychiatry: Judgement and insight appear normal. Mood & affect appropriate.    Data Reviewed: I have personally reviewed following labs and imaging studies  CBC: Recent Labs  Lab 01/30/24 0340 01/30/24 1541 01/31/24 0323 02/01/24 0336 02/02/24 0204  WBC 13.1* 15.5* 11.3* 14.7* 13.5*   HGB 11.0* 9.8* 10.2* 10.3* 10.2*  HCT 36.2* 34.2* 34.0* 35.4* 34.3*  MCV 85.2 87.5 85.9 87.0 86.2  PLT 269 279 236 322 313   Basic Metabolic Panel: Recent Labs  Lab 01/29/24 1849 01/30/24 0340 01/31/24 0323 02/01/24 0336 02/02/24 0204  NA 137 135 137 135 132*  K 4.2 4.3 4.1 4.8 4.6  CL 100 102 104 101 99  CO2 24 23 21* 26 24  GLUCOSE 81 77 125* 128* 142*  BUN 15 16 21 21  25*  CREATININE 0.67 0.54* 0.54* 0.69 0.77  CALCIUM  8.9 8.9 8.7* 9.3 9.0  MG 1.7  --  1.9  --  2.1  PHOS 3.7  --   --   --  2.1*   GFR: Estimated Creatinine Clearance: 64.1 mL/min (by C-G formula based on SCr of 0.77 mg/dL). Liver Function Tests: No results for input(s): AST, ALT, ALKPHOS, BILITOT, PROT, ALBUMIN  in the last 168 hours. No results for input(s): LIPASE, AMYLASE in the last 168 hours. No results for input(s): AMMONIA in the last 168 hours. Coagulation Profile: Recent Labs  Lab 01/29/24 1849 01/30/24 0340  INR 1.3* 1.3*   Cardiac Enzymes: No results for input(s): CKTOTAL, CKMB, CKMBINDEX, TROPONINI in the last 168 hours. BNP (last 3 results) No results for input(s): PROBNP in the last 8760 hours. HbA1C: No results for input(s): HGBA1C in the last 72 hours.  CBG: Recent Labs  Lab 02/01/24 1216 02/01/24 1704 02/01/24 2010 02/01/24 2349 02/02/24 0555  GLUCAP 129* 201* 147* 135* 117*   Lipid Profile: No results for input(s): CHOL, HDL, LDLCALC, TRIG, CHOLHDL, LDLDIRECT in the last 72 hours. Thyroid Function Tests: No results for input(s): TSH, T4TOTAL, FREET4, T3FREE, THYROIDAB in the last 72 hours. Anemia Panel: No results for input(s): VITAMINB12, FOLATE, FERRITIN, TIBC, IRON, RETICCTPCT in the last 72 hours. Sepsis Labs: No results for input(s): PROCALCITON, LATICACIDVEN in the last 168 hours.  Recent Results (from the past 240 hours)  MRSA Next Gen by PCR, Nasal     Status: None   Collection Time:  01/30/24 10:56 AM   Specimen: Nasal Mucosa; Nasal Swab  Result Value Ref Range Status   MRSA by PCR Next Gen NOT DETECTED NOT DETECTED Final    Comment: (NOTE) The GeneXpert MRSA Assay (FDA approved for NASAL specimens only), is one component of a comprehensive MRSA colonization surveillance program. It is not intended to diagnose MRSA infection nor to guide or monitor treatment for MRSA infections. Test performance is not FDA approved in patients less than 59 years old. Performed at [facility], [address] [place], [place] [address]     Radiology Studies: ECHOCARDIOGRAM COMPLETE Result Date: 02/01/2024    ECHOCARDIOGRAM REPORT   Patient Name:   Reginald [name] Date of Exam: 02/01/2024 Medical Rec #:  [id number]   Height:       68.0 in Accession #:    [id number]  Weight:       157.2 lb Date of Birth:  [birth year]    BSA:          1.845 m Patient Age:    88 years    BP:           104/89 mmHg Patient Gender: M           HR:           75 bpm. Exam Location:  Inpatient Procedure: 2D Echo, Cardiac Doppler, Color Doppler and Intracardiac            Opacification Agent (Both Spectral and Color Flow Doppler were            utilized during procedure). Indications:    CHF Acute Systolic I50.21  History:        Patient has no prior history of Echocardiogram examinations.                 CAD; Risk Factors:Hypertension and Diabetes.  Sonographer:    Tinnie Gosling RDCS Referring Phys: [phone] LINDSAY B ROBERTS IMPRESSIONS  1. Left ventricular ejection fraction, by estimation, is 20 to 25%. The left ventricle has severely decreased function. The left ventricle demonstrates global hypokinesis. There is moderate left ventricular hypertrophy. Left ventricular diastolic parameters are consistent with Grade II diastolic dysfunction (pseudonormalization). Elevated left atrial pressure.  2. Right ventricular systolic function is severely reduced. The right ventricular size is severely enlarged. There is moderately  elevated pulmonary artery systolic pressure.  3. Left atrial size was mildly dilated.  4. Right atrial size was severely dilated.  5. The mitral valve is normal in structure. Trivial mitral valve regurgitation. No evidence of mitral stenosis.  Moderate mitral annular calcification.  6. The aortic valve is tricuspid. Aortic valve regurgitation is not visualized. Aortic valve sclerosis/calcification is present, without any evidence of aortic stenosis.  7. The inferior vena cava is dilated in size with <50% respiratory variability, suggesting right atrial pressure of 15 mmHg. FINDINGS  Left Ventricle: Left ventricular ejection fraction, by estimation, is 20 to 25%. The left ventricle has severely decreased function. The left ventricle demonstrates global hypokinesis. Definity  contrast agent was given IV to delineate the left ventricular endocardial borders. The left ventricular internal cavity size was normal in size. There is moderate left ventricular hypertrophy. Left ventricular diastolic parameters are consistent with Grade II diastolic dysfunction (pseudonormalization).  Elevated left atrial pressure. Right Ventricle: The right ventricular size is severely enlarged. Right ventricular systolic function is severely reduced. There is moderately elevated pulmonary artery systolic pressure. The tricuspid regurgitant velocity is 2.84 m/s, and with an assumed right atrial pressure of 15 mmHg, the estimated right ventricular systolic pressure is 47.3 mmHg. Left Atrium: Left atrial size was mildly dilated. Right Atrium: Right atrial size was severely dilated. Pericardium: There is no evidence of pericardial effusion. Mitral Valve: The mitral valve is normal in structure. Moderate mitral annular calcification. Trivial mitral valve regurgitation. No evidence of mitral valve stenosis. Tricuspid Valve: The tricuspid valve is normal in structure. Tricuspid valve regurgitation is mild . No evidence of tricuspid stenosis. Aortic  Valve: The aortic valve is tricuspid. Aortic valve regurgitation is not visualized. Aortic valve sclerosis/calcification is present, without any evidence of aortic stenosis. Pulmonic Valve: The pulmonic valve was normal in structure. Pulmonic valve regurgitation is not visualized. No evidence of pulmonic stenosis. Aorta: The aortic root is normal in size and structure. Venous: The inferior vena cava is dilated in size with less than 50% respiratory variability, suggesting right atrial pressure of 15 mmHg. IAS/Shunts: No atrial level shunt detected by color flow Doppler.  LEFT VENTRICLE PLAX 2D LVIDd:         4.30 cm      Diastology LVIDs:         3.10 cm      LV e' medial:   3.81 cm/s LV PW:         1.50 cm      LV E/e' medial: 21.7 LV IVS:        1.50 cm LVOT diam:     2.36 cm LV SV:         43 LV SV Index:   23 LVOT Area:     4.37 cm  LV Volumes (MOD) LV vol d, MOD A4C: 177.0 ml LV vol s, MOD A4C: 89.9 ml LV SV MOD A4C:     177.0 ml RIGHT VENTRICLE            IVC RV S prime:     8.59 cm/s  IVC diam: 2.75 cm LEFT ATRIUM             Index        RIGHT ATRIUM           Index LA diam:        4.02 cm 2.18 cm/m   RA Area:     26.30 cm LA Vol (A2C):   49.6 ml 26.89 ml/m  RA Volume:   89.70 ml  48.62 ml/m LA Vol (A4C):   30.1 ml 16.32 ml/m LA Biplane Vol: 40.6 ml 22.01 ml/m  AORTIC VALVE LVOT Vmax:   54.30 cm/s LVOT Vmean:  37.100 cm/s LVOT  VTI:    0.098 m  AORTA Ao Root diam: 3.23 cm Ao Asc diam:  3.78 cm MITRAL VALVE               TRICUSPID VALVE MV Area (PHT): 6.22 cm    TR Peak grad:   32.3 mmHg MV Decel Time: 122 msec    TR Vmax:        284.00 cm/s MV E velocity: 82.80 cm/s MV A velocity: 49.30 cm/s  SHUNTS MV E/A ratio:  1.68        Systemic VTI:  0.10 m                            Systemic Diam: 2.36 cm Redell Shallow MD Electronically signed by Redell Shallow MD Signature Date/Time: 02/01/2024/2:57:26 PM    Final    Scheduled Meds:  acetaminophen   650 mg Oral TID   arformoterol   15 mcg Nebulization BID    ascorbic acid   500 mg Oral Q1400   aspirin   81 mg Oral Daily   atorvastatin   40 mg Oral Q lunch   Chlorhexidine  Gluconate Cloth  6 each Topical Daily   cholecalciferol   2,000 Units Oral Q1400   fenofibrate   54 mg Oral Daily   gabapentin   300 mg Oral BID   insulin  aspart  0-5 Units Subcutaneous QHS   insulin  aspart  0-9 Units Subcutaneous TID WC   insulin  aspart  3 Units Subcutaneous TID WC   insulin  glargine  10 Units Subcutaneous Daily   ipratropium  2 spray Each Nare TID   metoprolol  tartrate  25 mg Oral Daily   multivitamin with minerals  1 tablet Oral Daily   pantoprazole  (PROTONIX ) IV  40 mg Intravenous Daily   phosphorus  250 mg Oral BID   polyethylene glycol  17 g Oral Daily   predniSONE   40 mg Oral Q breakfast   revefenacin   175 mcg Nebulization Daily   senna  1 tablet Oral BID   sodium chloride  flush  3 mL Intravenous Q12H   sulfamethoxazole -trimethoprim   1 tablet Oral Once per day on Monday Wednesday Friday   traMADol   50 mg Oral Q6H   Continuous Infusions:  heparin  1,100 Units/hr (02/02/24 0411)     LOS: 4 days    Time spent: 50 mins    Darcel Dawley, MD [facility]   If 7PM-7AM, please contact night-coverage  "

## 2024-02-02 NOTE — Discharge Instructions (Addendum)
 [facility]  Discharge instructions  Lower Extremity Amputation  Please refer to the following instruction for your post-procedure care. Your surgeon or physician assistant will discuss any changes with you.  Activity  You are encouraged to walk as much as you can. You can slowly return to normal activities during the month after your surgery. Avoid strenuous activity and heavy lifting until your doctor tells you it's OK. Avoid activities such as vacuuming or swinging a golf club. Do not drive until your doctor give the OK and you are no longer taking prescription pain medications. It is also normal to have difficulty with sleep habits, eating and bowel movement after surgery. These will go away with time.  Bathing/Showering  Shower daily after you go home. Do not soak in a bathtub, hot tub, or swim until the incision heals completely.  Incision Care  Clean your incision with mild soap and water. Shower every day. Pat the area dry with a clean towel. You do not need a bandage unless otherwise instructed. Do not apply any ointments or creams to your incision. If you have open wounds you will be instructed how to care for them or a visiting nurse may be arranged for you. If you have staples or sutures along your incision they will be removed at your post-op appointment. You may have skin glue on your incision. Do not peel it off. It will come off on its own in about one week.  Diet  Resume your normal diet. There are no special food restrictions following this procedure. A low fat/ low cholesterol diet is recommended for all patients with vascular disease. In order to heal from your surgery, it is CRITICAL to get adequate nutrition. Your body requires vitamins, minerals, and protein. Vegetables are the best source of vitamins and minerals. Vegetables also provide the perfect balance of protein. Processed food has little nutritional value, so try to avoid  this.  Medications  Resume taking all your medications unless your doctor or physician assistant tells you not to. If your incision is causing pain, you may take over-the-counter pain relievers such as acetaminophen  (Tylenol ). If you were prescribed a stronger pain medication, please aware these medication can cause nausea and constipation. Prevent nausea by taking the medication with a snack or meal. Avoid constipation by drinking plenty of fluids and eating foods with high amount of fiber, such as fruits, vegetables, and grains. Take Colace 100 mg (an over-the-counter stool softener) twice a day as needed for constipation.  Do not take Tylenol  if you are taking prescription pain medications.  Follow Up  Our office will schedule a follow up appointment 4 weeks following discharge.  Please call us  immediately for any of the following conditions  Increase pain, redness, warmth, or drainage (pus) from your incision site(s) Fever of 101 degree or higher The swelling in your leg with the amputation suddenly worsens and becomes more painful than when you were in the hospital  Leg swelling is common after amputation surgery.  The swelling should improve over a few months following surgery. To improve the swelling, you may elevate your legs above the level of your heart while you are sitting or resting. Your surgeon or physician assistant may ask you to apply an ACE wrap or wear compression (TED) stockings to help to reduce swelling.  Reduce your risk of vascular disease  Stop smoking. If you would like help call [facility] at 1-800-QUIT-NOW (251 848 9739) or [place] at ([phone])194-4876.  Manage  your cholesterol Maintain a desired weight Control your diabetes weight Control your diabetes Keep your blood pressure down  If you have any questions, please call the office at  [phone]  _________________________________________________________________________________________________________________________________________________________________________________________________________________________________________________________________________________________________________________________________  Information on my medicine - ELIQUIS  (apixaban )  This medication education was reviewed with me or my healthcare representative as part of my discharge preparation.   Why was Eliquis  prescribed for you? Eliquis  was prescribed for you to reduce the risk of a blood clot forming that can cause a stroke if you have a medical condition called atrial fibrillation (a type of irregular heartbeat).  What do You need to know about Eliquis  ? Take your Eliquis  TWICE DAILY - one tablet in the morning and one tablet in the evening with or without food. If you have difficulty swallowing the tablet whole please discuss with your pharmacist how to take the medication safely.  Take Eliquis  exactly as prescribed by your doctor and DO NOT stop taking Eliquis  without talking to the doctor who prescribed the medication.  Stopping may increase your risk of developing a stroke.  Refill your prescription before you run out.  After discharge, you should have regular check-up appointments with your healthcare provider that is prescribing your Eliquis .  In the future your dose may need to be changed if your kidney function or weight changes by a significant amount or as you get older.  What do you do if you miss a dose? If you miss a dose, take it as soon as you remember on the same day and resume taking twice daily.  Do not take more than one dose of ELIQUIS  at the same time to make up a missed dose.  Important Safety Information A possible side effect of Eliquis  is bleeding. You should call your healthcare provider right away if you experience any of the following: Bleeding from  an injury or your nose that does not stop. Unusual colored urine (red or dark brown) or unusual colored stools (red or black). Unusual bruising for unknown reasons. A serious fall or if you hit your head (even if there is no bleeding).  Some medicines may interact with Eliquis  and might increase your risk of bleeding or clotting while on Eliquis . To help avoid this, consult your healthcare provider or pharmacist prior to using any new prescription or non-prescription medications, including herbals, vitamins, non-steroidal anti-inflammatory drugs (NSAIDs) and supplements.  This website has more information on Eliquis  (apixaban ): [email or web address] .com/eliquis dena

## 2024-02-02 NOTE — Progress Notes (Signed)
 Heart Failure Navigator Progress Note  Assessed for Heart & Vascular TOC clinic readiness.  Patient does not meet criteria due to established with [facility] cardiology.   Navigator available for reassessment of patient.   Duwaine Plant, PharmD, BCPS Heart Failure Stewardship Pharmacist Phone [phone]

## 2024-02-02 NOTE — Progress Notes (Signed)
 PHARMACY - ANTICOAGULATION CONSULT NOTE  Pharmacy Consult for heparin  transition to apixaban  Indication: atrial fibrillation  Labs: Recent Labs    01/31/24 0323 02/01/24 0336 02/02/24 0204  HGB 10.2* 10.3* 10.2*  HCT 34.0* 35.4* 34.3*  PLT [phone]  HEPARINUNFRC  --   --  0.29*  CREATININE 0.54* 0.69 0.77   Assessment: 89yo male slightly subtherapeutic on heparin  with initial dosing for Afib; no infusion issues or signs of bleeding per RN.    Plan:  DC heparin  infusion Start apixaban  5 mg po bid Monitor for bleeding  Thank you   Benedetta Heath BS, PharmD, BCPS Clinical Pharmacist 02/02/2024 7:12 AM  Contact: [phone] after 3 PM

## 2024-02-02 NOTE — Telephone Encounter (Signed)
 Patient Product/process Development Scientist completed.    The patient is insured through [employer]. Patient has Medicare and is not eligible for a copay card, but may be able to apply for patient assistance or Medicare RX Payment Plan (Patient Must reach out to their plan, if eligible for payment plan), if available.    Ran test claim for Eliquis  5 mg and the current 30 day co-pay is $35.00.   This test claim was processed through [facility]- copay amounts may vary at other pharmacies due to pharmacy/plan contracts, or as the patient moves through the different stages of their insurance plan.     Reyes Sharps, CPHT Pharmacy Technician Patient Advocate Specialist Lead [facility] Patient Advocate Team Direct Number: [phone]  Fax: [phone]

## 2024-02-02 NOTE — Progress Notes (Signed)
 Occupational Therapy Treatment Patient Details Name: Reginald [name] MRN: [id number] DOB: [birth year] Today's Date: 02/02/2024   History of present illness Pt is a 89 y.o. male who presented due to R foot pain. 1/13 s/p R AKA PMH: DM2, CAD s/p CABG, chronic hypoxic respiratory failure on 5L o2, HF with reduced EF 25-30%.   OT comments  Focus of session on bed mobility, sitting balance with reaching challenges and simulating lateral transfers along EOB. Pt need min to mod assist for bed mobility and +2 min safety for lateral scooting. Pt completes grooming with set up and close guarding at EOB. Pt has weaned off HHFNC, now on 6L with SpO2 of 97%, HR to 148 bpm with activity. Pt cued to take rest breaks, HR returned to 80s once returned to supine. Patient will benefit from intensive inpatient follow-up therapy, >3 hours/day. Plan is for pt to function at a w/c level.       If plan is discharge home, recommend the following:  Two people to help with walking and/or transfers;A lot of help with bathing/dressing/bathroom;Assistance with cooking/housework;Direct supervision/assist for medications management;Assist for transportation;Help with stairs or ramp for entrance   Equipment Recommendations  Other (comment) (defer to next venue)    Recommendations for Other Services      Precautions / Restrictions Precautions Precautions: Fall Recall of Precautions/Restrictions: Intact Precaution/Restrictions Comments: watch HR/O2, 5L baseline Restrictions Weight Bearing Restrictions Per Provider Order: Yes RLE Weight Bearing Per Provider Order: Non weight bearing       Mobility Bed Mobility Overal bed mobility: Needs Assistance Bed Mobility: Supine to Sit, Sit to Supine     Supine to sit: Mod assist, HOB elevated, Used rails Sit to supine: Min assist, HOB elevated   General bed mobility comments: able to bring LE's off EOB with ModA to raise trunk. Cueing throughout for technique. MinA for return to  supine for BLE management    Transfers Overall transfer level: Needs assistance                Lateral/Scoot Transfers: Min assist, +2 safety/equipment General transfer comment: simulated along EOB     Balance Overall balance assessment: Needs assistance   Sitting balance-Leahy Scale: Poor Sitting balance - Comments: progressed from min to CGA Postural control: Posterior lean                                 ADL either performed or assessed with clinical judgement   ADL Overall ADL's : Needs assistance/impaired     Grooming: Minimal assistance;Sitting;Wash/dry hands;Wash/dry face Grooming Details (indicate cue type and reason): close guard for sitting balance, set up of two washcloths                                    Extremity/Trunk Assessment              Vision       Perception     Praxis     Communication Communication Communication: Impaired Factors Affecting Communication: Hearing impaired   Cognition Arousal: Alert Behavior During Therapy: WFL for tasks assessed/performed Cognition: No apparent impairments                               Following commands: Intact        Cueing  Cueing Techniques: Verbal cues, Visual cues  Exercises      Shoulder Instructions       General Comments Gave HEP: glute sets, sidelying hip ABD, prone hip ext, supine SLR, chair push-up, tapping education and desensitization techniques    Pertinent Vitals/ Pain       Pain Assessment Pain Assessment: No/denies pain  Home Living Family/patient expects to be discharged to:: Private residence                                        Prior Functioning/Environment              Frequency  Min 2X/week        Progress Toward Goals  OT Goals(current goals can now be found in the care plan section)  Progress towards OT goals: Progressing toward goals  Acute Rehab OT Goals OT Goal Formulation:  With patient/family Time For Goal Achievement: 02/14/24 Potential to Achieve Goals: Good  Plan      Co-evaluation    PT/OT/SLP Co-Evaluation/Treatment: Yes Reason for Co-Treatment: For patient/therapist safety;Complexity of the patient's impairments (multi-system involvement) PT goals addressed during session: Mobility/safety with mobility;Balance;Proper use of DME OT goals addressed during session: ADL's and self-care;Proper use of Adaptive equipment and DME      AM-PAC OT 6 Clicks Daily Activity     Outcome Measure   Help from another person eating meals?: A Little Help from another person taking care of personal grooming?: A Little Help from another person toileting, which includes using toliet, bedpan, or urinal?: A Lot Help from another person bathing (including washing, rinsing, drying)?: A Lot Help from another person to put on and taking off regular upper body clothing?: A Little Help from another person to put on and taking off regular lower body clothing?: A Lot 6 Click Score: 15    End of Session Equipment Utilized During Treatment: Oxygen (6L)  OT Visit Diagnosis: Muscle weakness (generalized) (M62.81);Other (comment) (decreased activity tolerance)   Activity Tolerance Patient tolerated treatment well   Patient Left in bed;with call bell/phone within reach;with bed alarm set;with family/visitor present   Nurse Communication          Time: 8591-8551 OT Time Calculation (min): 40 min  Charges: OT General Charges $OT Visit: 1 Visit OT Treatments $Self Care/Home Management : 8-22 mins  Mliss HERO, OTR/L Acute Rehabilitation Services Office: [phone]   Kennth Mliss Helling 02/02/2024, 3:29 PM

## 2024-02-02 NOTE — Progress Notes (Signed)
" ° °  Inpatient Rehabilitation Admissions Coordinator   I will place rehab consult for full assessment of candidacy for CIR admit.  Heron Leavell, RN, MSN Rehab Admissions Coordinator [phone] 02/02/2024 5:17 PM  "

## 2024-02-03 DIAGNOSIS — L089 Local infection of the skin and subcutaneous tissue, unspecified: Secondary | ICD-10-CM | POA: Diagnosis not present

## 2024-02-03 LAB — HEPARIN LEVEL (UNFRACTIONATED): Heparin Unfractionated: >1.1 [IU]/mL — ABNORMAL HIGH (ref 0.30–0.70)

## 2024-02-03 LAB — BASIC METABOLIC PANEL WITH GFR
Anion gap: 7 (ref 5–15)
BUN: 33 mg/dL — ABNORMAL HIGH (ref 8–23)
CO2: 28 mmol/L (ref 22–32)
Calcium: 9.4 mg/dL (ref 8.9–10.3)
Chloride: 96 mmol/L — ABNORMAL LOW (ref 98–111)
Creatinine, Ser: 0.73 mg/dL (ref 0.61–1.24)
GFR, Estimated: >60 mL/min
Glucose, Bld: 141 mg/dL — ABNORMAL HIGH (ref 70–99)
Potassium: 5.2 mmol/L — ABNORMAL HIGH (ref 3.5–5.1)
Sodium: 131 mmol/L — ABNORMAL LOW (ref 135–145)

## 2024-02-03 LAB — CBC
HCT: 34 % — ABNORMAL LOW (ref 39.0–52.0)
Hemoglobin: 10.1 g/dL — ABNORMAL LOW (ref 13.0–17.0)
MCH: 25.4 pg — ABNORMAL LOW (ref 26.0–34.0)
MCHC: 29.7 g/dL — ABNORMAL LOW (ref 30.0–36.0)
MCV: 85.4 fL (ref 80.0–100.0)
Platelets: 329 K/uL (ref 150–400)
RBC: 3.98 MIL/uL — ABNORMAL LOW (ref 4.22–5.81)
RDW: 16.6 % — ABNORMAL HIGH (ref 11.5–15.5)
WBC: 13.5 K/uL — ABNORMAL HIGH (ref 4.0–10.5)
nRBC: 0.5 % — ABNORMAL HIGH (ref 0.0–0.2)

## 2024-02-03 LAB — GLUCOSE, CAPILLARY
Glucose-Capillary: 114 mg/dL — ABNORMAL HIGH (ref 70–99)
Glucose-Capillary: 131 mg/dL — ABNORMAL HIGH (ref 70–99)
Glucose-Capillary: 91 mg/dL (ref 70–99)
Glucose-Capillary: 125 mg/dL — ABNORMAL HIGH (ref 70–99)
Glucose-Capillary: 125 mg/dL — ABNORMAL HIGH (ref 70–99)

## 2024-02-03 LAB — MAGNESIUM: Magnesium: 2 mg/dL (ref 1.7–2.4)

## 2024-02-03 LAB — PHOSPHORUS: Phosphorus: 2.4 mg/dL — ABNORMAL LOW (ref 2.5–4.6)

## 2024-02-03 MED ORDER — FUROSEMIDE 10 MG/ML IJ SOLN
80.0000 mg | Freq: Two times a day (BID) | INTRAMUSCULAR | Status: DC
  Filled 2024-02-03: qty 8

## 2024-02-03 MED ORDER — FUROSEMIDE 40 MG PO TABS: 40.0000 mg | ORAL_TABLET | Freq: Every day | ORAL | Status: DC

## 2024-02-03 MED ORDER — SODIUM PHOSPHATES 45 MMOLE/15ML IV SOLN
15.0000 mmol | Freq: Once | INTRAVENOUS | Status: AC
  Administered 2024-02-03: 15 mmol via INTRAVENOUS
  Filled 2024-02-03: qty 5

## 2024-02-03 NOTE — Progress Notes (Signed)
 " PROGRESS NOTE    Reginald [name]  FMW:[id number] DOB: Jun 16, 1935 DOA: 01/29/2024 PCP: System, Provider Not In   Brief Narrative:  Reginald [name] is a  89 year old male with history of type 2 diabetes, coronary artery disease status post CABG, interstitial lung disease with chronic hypoxic respiratory failure on 5 L of oxygen at baseline, chronic systolic heart failure with reduced ejection fraction with last known ejection fraction of 25 to 30% and severe peripheral artery disease with right foot gangrene who has recently completed antibiotics for right foot infection 3 weeks back presented to the hospital with worsening pain and redness of the right foot. Patient noticed worsening discoloration of the toes and pain was constantly present even at rest.  Patient was admitted for further evaluation.  Vascular surgery Dr. Silver has evaluated the patient and recommended right AKA.  Postoperatively patient was moved to ICU for observation.  TRH pickup 02/01/2024.  Right AKA appears to be healing well.  Patient cleared from vascular surgery for CIR.  Assessment & Plan:   Principal Problem:   Foot infection Active Problems:   Gangrene of right foot (HCC)   DM II (diabetes mellitus, type II), controlled (HCC)   HFrEF (heart failure with reduced ejection fraction) (HCC)   On mechanically assisted ventilation (HCC)   Acute respiratory failure with hypoxia (HCC)   ILD (interstitial lung disease) (HCC)   Acute on chronic heart failure with preserved ejection fraction (HFpEF) (HCC)   Acute on chronic respiratory failure with hypoxia (HCC)  Right Foot gangrene , status post Right AKA: Severe PAD: Patient is status post right AKA. POD # 3. Right AKA appears to be healing well. ID states continue antibiotics 24 hours post amputation then stop. Antibiotics discontinued.  Follow-up in 4 weeks for staple removal. PT and OT recommended CIR.  Acute on chronic hypoxic respiratory failure due to IPF: Patient  follows up at [facility] pulmonary clinic. At baseline he is on 5 L of supplemental oxygen now requiring heated high flow. Continue to wean as tolerated.  He successfully weaned down to 6 L nasal cannula. Hold CellCept, continue increased dose of prednisone . Continue bronchodilators, Brovana , Yupelri . Continued on diuresis.  Acute on chronic HFr EF: Hold diuresis, Appears euvolemic now. Continue telemonitoring Resume metoprolol  and losartan . Cardiology is following  CAD status post CABG: Continue aspirin , Lipitor, gemfibrozil , metoprolol .   Resumed losartan  25 mg daily.  GERD: Continue pantoprazole .  DM II: Continue Lantus  10 units daily Continue sliding scale.  Anemia of chronic disease: H&H remains stable.  No obvious visible bleeding.  Leukocytosis: Likely due to prednisone .  New onset atrial flutter: Patient heart rate jumped up to 150s, EKG shows new onset atrial flutter. Continue telemetry.  Heart rate is now well-controlled. Cardio consulted.  Continue metoprolol  succinate 25 mg daily Continue Eliquis . Plan is to continue anticoagulation for 3 weeks and doing a cardioversion.  Hypophosphatemia - Will replete as needed.   DVT prophylaxis: Systemic anticoagulation with Eliquis . Code Status: Full code Family Communication: n/a Disposition Plan:    Status is: Inpatient Remains inpatient appropriate because: Patient admitted for ischemic and gangrenous right foot , underwent right AKA POD # 3.  Patient developed new onset A-fib now heart rate controlled.  Started on anticoagulation. Patient is now awaiting CIR.    Consultants:  Vascular surgery Infectious diseases Cardiology  Procedures: Status post right AKA Antimicrobials:  Anti-infectives (From admission, onward)    Start     Dose/Rate Route Frequency Ordered Stop  02/02/24 0900  sulfamethoxazole -trimethoprim  (BACTRIM  DS) 800-160 MG per tablet 1 tablet        1 tablet Oral Once per day on Monday Wednesday  Friday 02/01/24 0832     01/30/24 1000  vancomycin  (VANCOREADY) IVPB 1500 mg/300 mL  Status:  Discontinued        1,500 mg 150 mL/hr over 120 Minutes Intravenous Every 24 hours 01/29/24 2001 02/02/24 0736   01/29/24 2100  ceFEPIme  (MAXIPIME ) 2 g in sodium chloride  0.9 % 100 mL IVPB  Status:  Discontinued        2 g 200 mL/hr over 30 Minutes Intravenous Every 8 hours 01/29/24 2001 02/02/24 0736      Subjective: Patient was seen and examined at bedside.   No new complaints.  States he is doing okay.  Objective: Vitals:   02/03/24 0738 02/03/24 0837 02/03/24 0930 02/03/24 1034  BP: 101/66  (!) 101/58 107/71  Pulse: 74 73 74 74  Resp: 17 18  18   Temp: 97.6 F (36.4 C)   97.9 F (36.6 C)  TempSrc: Oral   Oral  SpO2: 93% 96%  100%  Weight:      Height:        Intake/Output Summary (Last 24 hours) at 02/03/2024 1436 Last data filed at 02/03/2024 0800 Gross per 24 hour  Intake 445.66 ml  Output 3000 ml  Net -2554.34 ml   Filed Weights   02/01/24 0300 02/02/24 0300 02/03/24 0500  Weight: 71.3 kg 77.1 kg 77.1 kg    General: Alert, oriented X3  Eyes: Pupils equal, reactive  Oral cavity: moist mucous membranes  Head: Atraumatic, normocephalic  Neck: supple  Chest: clear to auscultation. No crackles, no wheezes  CVS: S1,S2 RRR. No murmurs  Abd: No distention, soft, non-tender. No masses palpable  Extr: R AKA MSK: No joint deformities or swelling  Neurological: Grossly intact.    Data Reviewed:   CBC: Recent Labs  Lab 01/30/24 1541 01/31/24 0323 02/01/24 0336 02/02/24 0204 02/03/24 0305  WBC 15.5* 11.3* 14.7* 13.5* 13.5*  HGB 9.8* 10.2* 10.3* 10.2* 10.1*  HCT 34.2* 34.0* 35.4* 34.3* 34.0*  MCV 87.5 85.9 87.0 86.2 85.4  PLT 279 236 322 313 329   Basic Metabolic Panel: Recent Labs  Lab 01/29/24 1849 01/30/24 0340 01/31/24 0323 02/01/24 0336 02/02/24 0204 02/03/24 0305  NA 137 135 137 135 132* 131*  K 4.2 4.3 4.1 4.8 4.6 5.2*  CL 100 102 104 101 99 96*   CO2 24 23 21* 26 24 28   GLUCOSE 81 77 125* 128* 142* 141*  BUN 15 16 21 21  25* 33*  CREATININE 0.67 0.54* 0.54* 0.69 0.77 0.73  CALCIUM  8.9 8.9 8.7* 9.3 9.0 9.4  MG 1.7  --  1.9  --  2.1 2.0  PHOS 3.7  --   --   --  2.1* 2.4*   GFR: Estimated Creatinine Clearance: 64.1 mL/min (by C-G formula based on SCr of 0.73 mg/dL). Liver Function Tests: No results for input(s): AST, ALT, ALKPHOS, BILITOT, PROT, ALBUMIN  in the last 168 hours. No results for input(s): LIPASE, AMYLASE in the last 168 hours. No results for input(s): AMMONIA in the last 168 hours. Coagulation Profile: Recent Labs  Lab 01/29/24 1849 01/30/24 0340  INR 1.3* 1.3*   Cardiac Enzymes: No results for input(s): CKTOTAL, CKMB, CKMBINDEX, TROPONINI in the last 168 hours. BNP (last 3 results) No results for input(s): PROBNP in the last 8760 hours. HbA1C: No results for input(s): HGBA1C in the  last 72 hours.  CBG: Recent Labs  Lab 02/02/24 1651 02/02/24 2057 02/03/24 0615 02/03/24 1034 02/03/24 1217  GLUCAP 242* 170* 114* 131* 91   Lipid Profile: No results for input(s): CHOL, HDL, LDLCALC, TRIG, CHOLHDL, LDLDIRECT in the last 72 hours. Thyroid Function Tests: No results for input(s): TSH, T4TOTAL, FREET4, T3FREE, THYROIDAB in the last 72 hours. Anemia Panel: No results for input(s): VITAMINB12, FOLATE, FERRITIN, TIBC, IRON, RETICCTPCT in the last 72 hours. Sepsis Labs: No results for input(s): PROCALCITON, LATICACIDVEN in the last 168 hours.  Recent Results (from the past 240 hours)  MRSA Next Gen by PCR, Nasal     Status: None   Collection Time: 01/30/24 10:56 AM   Specimen: Nasal Mucosa; Nasal Swab  Result Value Ref Range Status   MRSA by PCR Next Gen NOT DETECTED NOT DETECTED Final    Comment: (NOTE) The GeneXpert MRSA Assay (FDA approved for NASAL specimens only), is one component of a comprehensive MRSA colonization  surveillance program. It is not intended to diagnose MRSA infection nor to guide or monitor treatment for MRSA infections. Test performance is not FDA approved in patients less than 45 years old. Performed at [facility], [address], [place], [place] [address]     Radiology Studies: ECHOCARDIOGRAM COMPLETE Result Date: 02/01/2024    ECHOCARDIOGRAM REPORT   Patient Name:   Reginald [name] Date of Exam: 02/01/2024 Medical Rec #:  [id number]   Height:       68.0 in Accession #:    [id number]  Weight:       157.2 lb Date of Birth:  03-21-1935    BSA:          1.845 m Patient Age:    88 years    BP:           104/89 mmHg Patient Gender: M           HR:           75 bpm. Exam Location:  Inpatient Procedure: 2D Echo, Cardiac Doppler, Color Doppler and Intracardiac            Opacification Agent (Both Spectral and Color Flow Doppler were            utilized during procedure). Indications:    CHF Acute Systolic I50.21  History:        Patient has no prior history of Echocardiogram examinations.                 CAD; Risk Factors:Hypertension and Diabetes.  Sonographer:    Tinnie Gosling RDCS Referring Phys: [phone] LINDSAY B ROBERTS IMPRESSIONS  1. Left ventricular ejection fraction, by estimation, is 20 to 25%. The left ventricle has severely decreased function. The left ventricle demonstrates global hypokinesis. There is moderate left ventricular hypertrophy. Left ventricular diastolic parameters are consistent with Grade II diastolic dysfunction (pseudonormalization). Elevated left atrial pressure.  2. Right ventricular systolic function is severely reduced. The right ventricular size is severely enlarged. There is moderately elevated pulmonary artery systolic pressure.  3. Left atrial size was mildly dilated.  4. Right atrial size was severely dilated.  5. The mitral valve is normal in structure. Trivial mitral valve regurgitation. No evidence of mitral stenosis. Moderate mitral annular calcification.  6. The  aortic valve is tricuspid. Aortic valve regurgitation is not visualized. Aortic valve sclerosis/calcification is present, without any evidence of aortic stenosis.  7. The inferior vena cava is dilated in size with <50% respiratory variability,  suggesting right atrial pressure of 15 mmHg. FINDINGS  Left Ventricle: Left ventricular ejection fraction, by estimation, is 20 to 25%. The left ventricle has severely decreased function. The left ventricle demonstrates global hypokinesis. Definity  contrast agent was given IV to delineate the left ventricular endocardial borders. The left ventricular internal cavity size was normal in size. There is moderate left ventricular hypertrophy. Left ventricular diastolic parameters are consistent with Grade II diastolic dysfunction (pseudonormalization).  Elevated left atrial pressure. Right Ventricle: The right ventricular size is severely enlarged. Right ventricular systolic function is severely reduced. There is moderately elevated pulmonary artery systolic pressure. The tricuspid regurgitant velocity is 2.84 m/s, and with an assumed right atrial pressure of 15 mmHg, the estimated right ventricular systolic pressure is 47.3 mmHg. Left Atrium: Left atrial size was mildly dilated. Right Atrium: Right atrial size was severely dilated. Pericardium: There is no evidence of pericardial effusion. Mitral Valve: The mitral valve is normal in structure. Moderate mitral annular calcification. Trivial mitral valve regurgitation. No evidence of mitral valve stenosis. Tricuspid Valve: The tricuspid valve is normal in structure. Tricuspid valve regurgitation is mild . No evidence of tricuspid stenosis. Aortic Valve: The aortic valve is tricuspid. Aortic valve regurgitation is not visualized. Aortic valve sclerosis/calcification is present, without any evidence of aortic stenosis. Pulmonic Valve: The pulmonic valve was normal in structure. Pulmonic valve regurgitation is not visualized. No  evidence of pulmonic stenosis. Aorta: The aortic root is normal in size and structure. Venous: The inferior vena cava is dilated in size with less than 50% respiratory variability, suggesting right atrial pressure of 15 mmHg. IAS/Shunts: No atrial level shunt detected by color flow Doppler.  LEFT VENTRICLE PLAX 2D LVIDd:         4.30 cm      Diastology LVIDs:         3.10 cm      LV e' medial:   3.81 cm/s LV PW:         1.50 cm      LV E/e' medial: 21.7 LV IVS:        1.50 cm LVOT diam:     2.36 cm LV SV:         43 LV SV Index:   23 LVOT Area:     4.37 cm  LV Volumes (MOD) LV vol d, MOD A4C: 177.0 ml LV vol s, MOD A4C: 89.9 ml LV SV MOD A4C:     177.0 ml RIGHT VENTRICLE            IVC RV S prime:     8.59 cm/s  IVC diam: 2.75 cm LEFT ATRIUM             Index        RIGHT ATRIUM           Index LA diam:        4.02 cm 2.18 cm/m   RA Area:     26.30 cm LA Vol (A2C):   49.6 ml 26.89 ml/m  RA Volume:   89.70 ml  48.62 ml/m LA Vol (A4C):   30.1 ml 16.32 ml/m LA Biplane Vol: 40.6 ml 22.01 ml/m  AORTIC VALVE LVOT Vmax:   54.30 cm/s LVOT Vmean:  37.100 cm/s LVOT VTI:    0.098 m  AORTA Ao Root diam: 3.23 cm Ao Asc diam:  3.78 cm MITRAL VALVE               TRICUSPID VALVE MV Area (PHT): 6.22 cm  TR Peak grad:   32.3 mmHg MV Decel Time: 122 msec    TR Vmax:        284.00 cm/s MV E velocity: 82.80 cm/s MV A velocity: 49.30 cm/s  SHUNTS MV E/A ratio:  1.68        Systemic VTI:  0.10 m                            Systemic Diam: 2.36 cm Redell Shallow MD Electronically signed by Redell Shallow MD Signature Date/Time: 02/01/2024/2:57:26 PM    Final    Scheduled Meds:  acetaminophen   650 mg Oral TID   apixaban   5 mg Oral BID   arformoterol   15 mcg Nebulization BID   ascorbic acid   500 mg Oral Q1400   aspirin   81 mg Oral Daily   atorvastatin   40 mg Oral Q lunch   Chlorhexidine  Gluconate Cloth  6 each Topical Daily   cholecalciferol   2,000 Units Oral Q1400   fenofibrate   54 mg Oral Daily   [START ON 02/04/2024]  furosemide   40 mg Oral Daily   gabapentin   300 mg Oral BID   insulin  aspart  0-5 Units Subcutaneous QHS   insulin  aspart  0-9 Units Subcutaneous TID WC   insulin  aspart  3 Units Subcutaneous TID WC   insulin  glargine  10 Units Subcutaneous Daily   ipratropium  2 spray Each Nare TID   losartan   25 mg Oral Daily   metoprolol  succinate  25 mg Oral Daily   multivitamin with minerals  1 tablet Oral Daily   pantoprazole  (PROTONIX ) IV  40 mg Intravenous Daily   polyethylene glycol  17 g Oral Daily   predniSONE   40 mg Oral Q breakfast   revefenacin   175 mcg Nebulization Daily   senna  1 tablet Oral BID   sodium chloride  flush  3 mL Intravenous Q12H   sulfamethoxazole -trimethoprim   1 tablet Oral Once per day on Monday Wednesday Friday   traMADol   50 mg Oral Q6H   Continuous Infusions:  sodium PHOSPHATE  IVPB (in mmol) 15 mmol (02/03/24 1031)     LOS: 5 days    MDALA-GAUSI, Chavela Justiniano AGATHA, MD [facility]   If 7PM-7AM, please contact night-coverage  "

## 2024-02-03 NOTE — Progress Notes (Signed)
 Inpatient Rehab Admissions:  Inpatient Rehab Consult received.  I met with patient at the bedside for rehabilitation assessment and to discuss goals and expectations of an inpatient rehab admission.  Discussed average length of stay and discharge home after completion of CIR. Pt acknowledged understanding and is interested in pursuing CIR. Pt gave permission to contact wife, Alisa. Called [facility]ail to verify dispo. No one answered. Left a message; awaiting return call. Will continue to follow.  Signed: Tinnie Yvone Cohens, MS, CCC-SLP Admissions Coordinator [phone]

## 2024-02-03 NOTE — Plan of Care (Signed)
" °  Problem: Education: Goal: Knowledge of General Education information will improve Description: Including pain rating scale, medication(s)/side effects and non-pharmacologic comfort measures Outcome: Progressing   Problem: Health Behavior/Discharge Planning: Goal: Ability to manage health-related needs will improve Outcome: Progressing   Problem: Clinical Measurements: Goal: Ability to maintain clinical measurements within normal limits will improve Outcome: Progressing Goal: Will remain free from infection Outcome: Progressing Goal: Diagnostic test results will improve Outcome: Progressing   Problem: Nutrition: Goal: Adequate nutrition will be maintained Outcome: Progressing   Problem: Coping: Goal: Level of anxiety will decrease Outcome: Progressing   Problem: Elimination: Goal: Will not experience complications related to bowel motility Outcome: Progressing Goal: Will not experience complications related to urinary retention Outcome: Progressing   Problem: Pain Managment: Goal: General experience of comfort will improve and/or be controlled Outcome: Progressing   Problem: Safety: Goal: Ability to remain free from injury will improve Outcome: Progressing   Problem: Skin Integrity: Goal: Risk for impaired skin integrity will decrease Outcome: Progressing   Problem: Fluid Volume: Goal: Ability to maintain a balanced intake and output will improve Outcome: Progressing   Problem: Health Behavior/Discharge Planning: Goal: Ability to identify and utilize available resources and services will improve Outcome: Progressing   Problem: Metabolic: Goal: Ability to maintain appropriate glucose levels will improve Outcome: Progressing   Problem: Nutritional: Goal: Maintenance of adequate nutrition will improve Outcome: Progressing   Problem: Skin Integrity: Goal: Risk for impaired skin integrity will decrease Outcome: Progressing   Problem: Tissue Perfusion: Goal:  Adequacy of tissue perfusion will improve Outcome: Progressing   Problem: Respiratory: Goal: Ability to maintain a clear airway and adequate ventilation will improve Outcome: Progressing   Problem: Role Relationship: Goal: Method of communication will improve Outcome: Progressing   "

## 2024-02-03 NOTE — Progress Notes (Signed)
 "   Progress Note  Patient Name: Reginald [name] Date of Encounter: 02/03/2024  Primary Cardiologist: None  Subjective   Short of breath at rest.  Inpatient Medications    Scheduled Meds:  acetaminophen   650 mg Oral TID   apixaban   5 mg Oral BID   arformoterol   15 mcg Nebulization BID   ascorbic acid   500 mg Oral Q1400   aspirin   81 mg Oral Daily   atorvastatin   40 mg Oral Q lunch   Chlorhexidine  Gluconate Cloth  6 each Topical Daily   cholecalciferol   2,000 Units Oral Q1400   fenofibrate   54 mg Oral Daily   furosemide   80 mg Intravenous BID   gabapentin   300 mg Oral BID   insulin  aspart  0-5 Units Subcutaneous QHS   insulin  aspart  0-9 Units Subcutaneous TID WC   insulin  aspart  3 Units Subcutaneous TID WC   insulin  glargine  10 Units Subcutaneous Daily   ipratropium  2 spray Each Nare TID   losartan   25 mg Oral Daily   metoprolol  succinate  25 mg Oral Daily   multivitamin with minerals  1 tablet Oral Daily   pantoprazole  (PROTONIX ) IV  40 mg Intravenous Daily   polyethylene glycol  17 g Oral Daily   predniSONE   40 mg Oral Q breakfast   revefenacin   175 mcg Nebulization Daily   senna  1 tablet Oral BID   sodium chloride  flush  3 mL Intravenous Q12H   sulfamethoxazole -trimethoprim   1 tablet Oral Once per day on Monday Wednesday Friday   traMADol   50 mg Oral Q6H   Continuous Infusions:  sodium PHOSPHATE  IVPB (in mmol) 15 mmol (02/03/24 1031)   PRN Meds: HYDROmorphone  (DILAUDID ) injection, LORazepam , metoprolol  tartrate, [DISCONTINUED] ondansetron  **OR** ondansetron  (ZOFRAN ) IV, mouth rinse, senna-docusate, sodium chloride  flush   Vital Signs    Vitals:   02/03/24 0500 02/03/24 0738 02/03/24 0837 02/03/24 0930  BP:  101/66  (!) 101/58  Pulse:  74 73 74  Resp:  17 18   Temp:  97.6 F (36.4 C)    TempSrc:  Oral    SpO2:  93% 96%   Weight: 77.1 kg     Height:        Intake/Output Summary (Last 24 hours) at 02/03/2024 1033 Last data filed at 02/03/2024 0745 Gross  per 24 hour  Intake 205.66 ml  Output 3000 ml  Net -2794.34 ml   Filed Weights   02/01/24 0300 02/02/24 0300 02/03/24 0500  Weight: 71.3 kg 77.1 kg 77.1 kg    Telemetry     Personally reviewed.  In atrial flutter, rates controlled.  ECG    Not performed today.  Physical Exam   GEN: in acute distress.   Neck: JVD elevated Cardiac: RRR, no murmur, rub, or gallop.  Respiratory: Nonlabored. Clear to auscultation bilaterally. GI: Soft, nontender, bowel sounds present. MS: No edema; No deformity. Neuro:  Nonfocal. Psych: Alert and oriented x 3. Normal affect.  Labs    Chemistry Recent Labs  Lab 02/01/24 0336 02/02/24 0204 02/03/24 0305  NA 135 132* 131*  K 4.8 4.6 5.2*  CL 101 99 96*  CO2 26 24 28   GLUCOSE 128* 142* 141*  BUN 21 25* 33*  CREATININE 0.69 0.77 0.73  CALCIUM  9.3 9.0 9.4  GFRNONAA >60 >60 >60  ANIONGAP 8 9 7      Hematology Recent Labs  Lab 02/01/24 0336 02/02/24 0204 02/03/24 0305  WBC 14.7* 13.5* 13.5*  RBC  4.07* 3.98* 3.98*  HGB 10.3* 10.2* 10.1*  HCT 35.4* 34.3* 34.0*  MCV 87.0 86.2 85.4  MCH 25.3* 25.6* 25.4*  MCHC 29.1* 29.7* 29.7*  RDW 16.4* 16.5* 16.6*  PLT 322 313 329    Cardiac EnzymesNo results for input(s): TROPONINIHS in the last 720 hours.  BNPNo results for input(s): BNP, PROBNP in the last 168 hours.   DDimerNo results for input(s): DDIMER in the last 168 hours.   Assessment & Plan   Acute on chronic hypoxic respiratory failure (ADHF/IPF), 5L O2 baseline Acute on chronic systolic and diastolic heart failure - In acute respiratory distress, tachypneic, SOB at rest and JVD elevated. - Grossly volume overloaded.  Start IV Lasix  80 mg twice daily.  Normal serum creatinine. - Continue metoprolol  succinate 25 mg once daily. - Continue losartan  25 mg once daily. - Start spironolactone 25 mg once daily. - Echocardiogram this admission showed LVEF 20 to 25%, G2 DD, RV function severely reduced, RV severely enlarged,  moderate pulmonary hypertension, no valvular heart disease and CVP 15 mmHg. - Outpatient ischemia evaluation if functional at baseline.  Has prior history of CABG.  Atrial flutter, new onset - Rate controlled. - Telemetry reviewed, in atrial flutter, HR 70-80s. - Continue metoprolol  succinate 25 mg once daily. - Continue Eliquis  5 mg twice daily. - Outpatient DCCV after 3 weeks of uninterrupted systemic AC.  RLE CLI s/p AKA - Vascular surgery following.  CAD s/p CABG - Not on aspirin  due to Eliquis  use, continue atorvastatin  40 mg nightly.  30 minutes spent in reviewing prior medical records, reports, more than 3 labs, discussion and documentation.  Signed, Diannah SHAUNNA Maywood, MD  02/03/2024, 10:33 AM    "

## 2024-02-04 DIAGNOSIS — I4892 Unspecified atrial flutter: Secondary | ICD-10-CM

## 2024-02-04 DIAGNOSIS — I5043 Acute on chronic combined systolic (congestive) and diastolic (congestive) heart failure: Secondary | ICD-10-CM | POA: Diagnosis not present

## 2024-02-04 DIAGNOSIS — I739 Peripheral vascular disease, unspecified: Secondary | ICD-10-CM | POA: Diagnosis not present

## 2024-02-04 LAB — COMPREHENSIVE METABOLIC PANEL WITH GFR
ALT: 23 U/L (ref 0–44)
AST: 20 U/L (ref 15–41)
Albumin: 3.4 g/dL — ABNORMAL LOW (ref 3.5–5.0)
Alkaline Phosphatase: 106 U/L (ref 38–126)
Anion gap: 10 (ref 5–15)
BUN: 32 mg/dL — ABNORMAL HIGH (ref 8–23)
CO2: 25 mmol/L (ref 22–32)
Calcium: 9.8 mg/dL (ref 8.9–10.3)
Chloride: 97 mmol/L — ABNORMAL LOW (ref 98–111)
Creatinine, Ser: 0.63 mg/dL (ref 0.61–1.24)
GFR, Estimated: >60 mL/min
Glucose, Bld: 148 mg/dL — ABNORMAL HIGH (ref 70–99)
Potassium: 5.3 mmol/L — ABNORMAL HIGH (ref 3.5–5.1)
Sodium: 131 mmol/L — ABNORMAL LOW (ref 135–145)
Total Bilirubin: 0.3 mg/dL (ref 0.0–1.2)
Total Protein: 6.1 g/dL — ABNORMAL LOW (ref 6.5–8.1)

## 2024-02-04 LAB — CBC
HCT: 35 % — ABNORMAL LOW (ref 39.0–52.0)
Hemoglobin: 10.5 g/dL — ABNORMAL LOW (ref 13.0–17.0)
MCH: 25.3 pg — ABNORMAL LOW (ref 26.0–34.0)
MCHC: 30 g/dL (ref 30.0–36.0)
MCV: 84.3 fL (ref 80.0–100.0)
Platelets: 395 K/uL (ref 150–400)
RBC: 4.15 MIL/uL — ABNORMAL LOW (ref 4.22–5.81)
RDW: 16.7 % — ABNORMAL HIGH (ref 11.5–15.5)
WBC: 13.3 K/uL — ABNORMAL HIGH (ref 4.0–10.5)
nRBC: 0.5 % — ABNORMAL HIGH (ref 0.0–0.2)

## 2024-02-04 LAB — GLUCOSE, CAPILLARY
Glucose-Capillary: 125 mg/dL — ABNORMAL HIGH (ref 70–99)
Glucose-Capillary: 135 mg/dL — ABNORMAL HIGH (ref 70–99)
Glucose-Capillary: 154 mg/dL — ABNORMAL HIGH (ref 70–99)
Glucose-Capillary: 135 mg/dL — ABNORMAL HIGH (ref 70–99)

## 2024-02-04 LAB — PHOSPHORUS: Phosphorus: 3.1 mg/dL (ref 2.5–4.6)

## 2024-02-04 MED ORDER — FUROSEMIDE 10 MG/ML IJ SOLN
40.0000 mg | Freq: Four times a day (QID) | INTRAMUSCULAR | Status: DC
  Administered 2024-02-04 – 2024-02-06 (×8): 40 mg via INTRAVENOUS
  Filled 2024-02-04 (×8): qty 4

## 2024-02-04 NOTE — Progress Notes (Addendum)
 "   Progress Note  Patient Name: Reginald [name] Date of Encounter: 02/04/2024  Primary Cardiologist: None  Subjective   Short of breath at rest.  Inpatient Medications    Scheduled Meds:  acetaminophen   650 mg Oral TID   apixaban   5 mg Oral BID   arformoterol   15 mcg Nebulization BID   ascorbic acid   500 mg Oral Q1400   aspirin   81 mg Oral Daily   atorvastatin   40 mg Oral Q lunch   Chlorhexidine  Gluconate Cloth  6 each Topical Daily   cholecalciferol   2,000 Units Oral Q1400   fenofibrate   54 mg Oral Daily   furosemide   40 mg Intravenous Q6H   gabapentin   300 mg Oral BID   insulin  aspart  0-5 Units Subcutaneous QHS   insulin  aspart  0-9 Units Subcutaneous TID WC   insulin  aspart  3 Units Subcutaneous TID WC   insulin  glargine  10 Units Subcutaneous Daily   ipratropium  2 spray Each Nare TID   losartan   25 mg Oral Daily   metoprolol  succinate  25 mg Oral Daily   multivitamin with minerals  1 tablet Oral Daily   pantoprazole  (PROTONIX ) IV  40 mg Intravenous Daily   polyethylene glycol  17 g Oral Daily   predniSONE   40 mg Oral Q breakfast   revefenacin   175 mcg Nebulization Daily   senna  1 tablet Oral BID   sodium chloride  flush  3 mL Intravenous Q12H   sulfamethoxazole -trimethoprim   1 tablet Oral Once per day on Monday Wednesday Friday   traMADol   50 mg Oral Q6H   Continuous Infusions:   PRN Meds: HYDROmorphone  (DILAUDID ) injection, LORazepam , metoprolol  tartrate, [DISCONTINUED] ondansetron  **OR** ondansetron  (ZOFRAN ) IV, mouth rinse, senna-docusate, sodium chloride  flush   Vital Signs    Vitals:   02/04/24 0300 02/04/24 0400 02/04/24 0700 02/04/24 0947  BP: 106/72 112/83 115/74 113/70  Pulse: 71 73 73 73  Resp: 20 19 20    Temp: 97.7 F (36.5 C) 97.8 F (36.6 C) 97.7 F (36.5 C)   TempSrc: Oral Oral Oral   SpO2: 99% 95% 94%   Weight: 75.5 kg     Height:        Intake/Output Summary (Last 24 hours) at 02/04/2024 0954 Last data filed at 02/04/2024 0949 Gross  per 24 hour  Intake 258 ml  Output 750 ml  Net -492 ml   Filed Weights   02/02/24 0300 02/03/24 0500 02/04/24 0300  Weight: 77.1 kg 77.1 kg 75.5 kg    Telemetry     Personally reviewed.  In atrial flutter, rates controlled.  ECG    Not performed today.  Physical Exam   GEN: in acute distress.   Neck: JVD elevated Cardiac: RRR, no murmur, rub, or gallop.  Respiratory: Nonlabored. Clear to auscultation bilaterally. GI: Soft, nontender, bowel sounds present. MS: No edema; No deformity. Neuro:  Nonfocal. Psych: Alert and oriented x 3. Normal affect.  Labs    Chemistry Recent Labs  Lab 02/02/24 0204 02/03/24 0305 02/04/24 0855  NA 132* 131* 131*  K 4.6 5.2* 5.3*  CL 99 96* 97*  CO2 24 28 25   GLUCOSE 142* 141* 148*  BUN 25* 33* 32*  CREATININE 0.77 0.73 0.63  CALCIUM  9.0 9.4 9.8  PROT  --   --  6.1*  ALBUMIN   --   --  3.4*  AST  --   --  20  ALT  --   --  23  ALKPHOS  --   --  106  BILITOT  --   --  0.3  GFRNONAA >60 >60 >60  ANIONGAP 9 7 10      Hematology Recent Labs  Lab 02/02/24 0204 02/03/24 0305 02/04/24 0237  WBC 13.5* 13.5* 13.3*  RBC 3.98* 3.98* 4.15*  HGB 10.2* 10.1* 10.5*  HCT 34.3* 34.0* 35.0*  MCV 86.2 85.4 84.3  MCH 25.6* 25.4* 25.3*  MCHC 29.7* 29.7* 30.0  RDW 16.5* 16.6* 16.7*  PLT 313 329 395    Cardiac EnzymesNo results for input(s): TROPONINIHS in the last 720 hours.  BNPNo results for input(s): BNP, PROBNP in the last 168 hours.   DDimerNo results for input(s): DDIMER in the last 168 hours.   Assessment & Plan   Acute on chronic hypoxic respiratory failure (ADHF/IPF), 5L O2 baseline Acute on chronic systolic and diastolic heart failure - Volume overloaded.  Tachypneic and SOB at rest.  He reports that he is back to his baseline.  IV Lasix  discontinued yesterday due to softer blood pressures.  Resume IV Lasix  40 mg Q6h due to soft BP.  Normal serum creatinine. - Continue metoprolol  succinate 25 mg once daily. -  Continue losartan  25 mg once daily. - Hold spironolactone 25 mg once daily due to mild hyperkalemia. - Echocardiogram this admission showed LVEF 20 to 25%, G2 DD, RV function severely reduced, RV severely enlarged, moderate pulmonary hypertension, no valvular heart disease and CVP 15 mmHg. - Outpatient ischemia evaluation if functional at baseline.  Has prior history of CABG.  Atrial flutter, new onset - Rate controlled. - Telemetry reviewed, in atrial flutter, HR 70-80s. - Continue metoprolol  succinate 25 mg once daily. - Continue Eliquis  5 mg twice daily. - Outpatient DCCV after 3 weeks of uninterrupted systemic AC.  RLE CLI s/p AKA - Vascular surgery following.  CAD s/p CABG - Not on aspirin  due to Eliquis  use, continue atorvastatin  40 mg nightly.  30 minutes spent in reviewing prior medical records, reports, more than 3 labs, discussion and documentation.  Signed, Diannah SHAUNNA Maywood, MD  02/04/2024, 9:54 AM    "

## 2024-02-04 NOTE — PMR Pre-admission (Signed)
 PMR Admission Coordinator Pre-Admission Assessment  Patient: Reginald [name] is an 89 y.o., male MRN: [id number] DOB: 1935-06-12 Height: 5' 8 (172.7 cm) Weight: 75.5 kg  Insurance Information HMO:     PPO:      PCP:      IPA:      80/20: yes     OTHER:  PRIMARY: Medicare Part A and B      Policy#: [id number]      Subscriber: patient CM Name:       Phone#:      Fax#:  Pre-Cert#:       Employer:  Benefits:  Phone #: verified eligibility on [employer] on 02/04/24     Name:  Eff. Date: Part A and B effective 02/17/02     Deduct: $1,736      Out of Pocket Max: NA      Life Max: NA CIR: 100% coverage      SNF: 100% coverage for days 1-20, 80% coverage for days 21-100 Outpatient: 80% coverage     Co-Pay: 20% Home Health: 100% coverage      Co-Pay:  DME: 80% coverage     Co-Pay: 20% Providers: pt's choice SECONDARY: BCBS/Federal EMP PPO      Policy#: [id number]     Phone#: [phone]  Financial Counselor:       Phone#:   The Data Collection Information Summary for patients in Inpatient Rehabilitation Facilities with attached Privacy Act Statement-Health Care Records was provided and verbally reviewed with: Patient  Emergency Contact Information Contact Information     Name Relation Home Work Mobile   Musial,Gail Spouse [phone]  [phone]      Other Contacts     Name Relation Home Work Mobile   Royster,Michael Son [phone]         Current Medical History  Patient Admitting Diagnosis: Right AKA History of Present Illness: Pt is an 89  year old male with medical hx significant for: DM II, CAD s/p CABG, interstitial lung disease with chronic hypoxic respiratory failure on 5 L of oxygen at baseline, chronic systolic heart failure with reduced EF with last known EF of 25-30%, severe peripheral artery disease, right foot gangrene. Pt with recent right foot infection; on antibiotics x3 weeks. Pt originally presented to [facility] d/t worsening pain and redness of right foot. X-ray  negative for acute abnormality. Given IV vancomycin  and Zosyn. Pt transferred to [facility] on 01/29/24 for further treatment. Pt on nonrebreather mask then transitioned to HFNC. Vascular Surgery consulted. Noted pt with critical limb ischemia of right LE with severe tissue loss involving entirety of forefoot. Recommended AKA. Pt underwent right AKA by Dr. Pearline on 01/30/24. Cardiology consulted to assess new onset atrial flutter with intermittent episodes of RVR. Recommend rate control management and Eliquis ; pursue cardioversion after 3 weeks of anticoagulation. Therapy evaluations completed and CIR recommended d/t pt's deficits in functional mobility.    Patient's medical record from [facility] has been reviewed by the rehabilitation admission coordinator and physician.  Past Medical History  Past Medical History:  Diagnosis Date   Arthritis    Complication of anesthesia    2014 BP DROPPED S/P BACK SURGERY REC. IV FLUIDS   Diabetes mellitus without complication (HCC)    Disc displacement, lumbar    Full dentures    GERD (gastroesophageal reflux disease)    Hearing deficit    does not wear H/A   History of kidney stones    Hypertension  Kidney stone    Neuromuscular disorder (HCC)    Pneumonia    Umbilical hernia    Wears glasses     Has the patient had major surgery during 100 days prior to admission? Yes  Family History   family history includes Aneurysm in his father; Aplastic anemia in his sister; Heart disease in his father and another family member; Stroke in his mother.  Current Medications Current Medications[1]  Patients Current Diet:  Diet Order             Diet Heart Room service appropriate? No; Fluid consistency: Thin  Diet effective now                   Precautions / Restrictions Precautions Precautions: Fall Precaution/Restrictions Comments: watch HR/O2, 5L baseline Restrictions Weight Bearing Restrictions Per Provider Order:  Yes RLE Weight Bearing Per Provider Order: Non weight bearing (sgx AKA)   Has the patient had 2 or more falls or a fall with injury in the past year? Yes  Prior Activity Level Limited Community (1-2x/wk): gets out of house ~2-3 days/week  Prior Functional Level Self Care: Did the patient need help bathing, dressing, using the toilet or eating? Independent  Indoor Mobility: Did the patient need assistance with walking from room to room (with or without device)? Independent  Stairs: Did the patient need assistance with internal or external stairs (with or without device)? Independent  Functional Cognition: Did the patient need help planning regular tasks such as shopping or remembering to take medications? Independent  Patient Information Are you of Hispanic, Latino/a,or Spanish origin?: A. No, not of Hispanic, Latino/a, or Spanish origin What is your race?: A. White Do you need or want an interpreter to communicate with a doctor or health care staff?: 0. No  Patient's Response To:  Health Literacy and Transportation Is the patient able to respond to health literacy and transportation needs?: Yes Health Literacy - How often do you need to have someone help you when you read instructions, pamphlets, or other written material from your doctor or pharmacy?: Never In the past 12 months, has lack of transportation kept you from medical appointments or from getting medications?: No In the past 12 months, has lack of transportation kept you from meetings, work, or from getting things needed for daily living?: No  Home Assistive Devices / Equipment Home Equipment: Agricultural Consultant (2 wheels), Transport chair, Wheelchair - manual, Grab bars - toilet  Prior Device Use: Indicate devices/aids used by the patient prior to current illness, exacerbation or injury? Walker  Current Functional Level Cognition  Orientation Level: Oriented X4    Extremity Assessment (includes Sensation/Coordination)   Upper Extremity Assessment: Generalized weakness  Lower Extremity Assessment: RLE deficits/detail RLE Deficits / Details: new AKA, ACE wrap on stump clean and dry. able to demo good ROM against gravity    ADLs  Overall ADL's : Needs assistance/impaired Eating/Feeding: Minimal assistance, Bed level Eating/Feeding Details (indicate cue type and reason): opening packets and cutting food Grooming: Minimal assistance, Sitting, Wash/dry hands, Wash/dry face Grooming Details (indicate cue type and reason): close guard for sitting balance, set up of two washcloths Upper Body Bathing: Maximal assistance, Bed level Lower Body Bathing: Total assistance, Bed level Upper Body Dressing : Maximal assistance, Bed level Lower Body Dressing: Total assistance, Bed level General ADL Comments: bed level this session due to desaturation    Mobility  Overal bed mobility: Needs Assistance Bed Mobility: Supine to Sit, Sit to Supine Rolling: Min assist  Supine to sit: Mod assist, HOB elevated, Used rails Sit to supine: Min assist, HOB elevated General bed mobility comments: able to bring LE's off EOB with ModA to raise trunk. Cueing throughout for technique. MinA for return to supine for BLE management    Transfers  Overall transfer level: Needs assistance Equipment used: None Transfers: Bed to chair/wheelchair/BSC Bed to/from chair/wheelchair/BSC transfer type:: Lateral/scoot transfer  Lateral/Scoot Transfers: Min assist, +2 safety/equipment General transfer comment: simulated along EOB    Ambulation / Gait / Stairs / Engineer, Drilling / Balance Dynamic Sitting Balance Sitting balance - Comments: progressed from min to CGA Balance Overall balance assessment: Needs assistance Sitting-balance support: Bilateral upper extremity supported, Feet supported Sitting balance-Leahy Scale: Poor Sitting balance - Comments: progressed from min to CGA Postural control: Posterior lean    Special  considerations/life events  Oxygen none, Bladder incontinence, External Urinary Catheter, Skin Ecchymosis: arm/lower, bilateral; Erythema/Redness: buttocks/bilateral; Surgical Incision: leg/right, and Diabetic management Novolog  3 units 3x daily with meals; Novolog  0-5 units daily at bedtime; Novolog  0-9 units 3x daily with meals; Lantus  10 units daily   Previous Home Environment (from acute therapy documentation) Living Arrangements: Spouse/significant other  Lives With: Spouse Available Help at Discharge: Family, Available 24 hours/day Type of Home: House Home Layout: Able to live on main level with bedroom/bathroom Home Access: Level entry Bathroom Shower/Tub: Sponge bathes at baseline (spouse helps) Bathroom Toilet: Handicapped height Bathroom Accessibility: Yes How Accessible: Accessible via walker Home Care Services: No Additional Comments: uses walker in the house  Discharge Living Setting Plans for Discharge Living Setting: Patient's home Type of Home at Discharge: House Discharge Home Layout: Able to live on main level with bedroom/bathroom Discharge Home Access: Level entry Discharge Bathroom Shower/Tub: Tub/shower unit (takes sponge baths) Discharge Bathroom Toilet: Handicapped height Discharge Bathroom Accessibility: Yes How Accessible: Accessible via walker Does the patient have any problems obtaining your medications?: No  Social/Family/Support Systems Anticipated Caregiver: Chip Canepa, wife Anticipated Caregiver's Contact Information: [phone] Caregiver Availability: 24/7 Discharge Plan Discussed with Primary Caregiver: Yes Is Caregiver In Agreement with Plan?: Yes Does Caregiver/Family have Issues with Lodging/Transportation while Pt is in Rehab?: No  Goals Patient/Family Goal for Rehab: PT/OT Min-mod A Expected length of stay:18-21 days  Pt/Family Agrees to Admission and willing to participate: Yes Program Orientation Provided & Reviewed with Pt/Caregiver  Including Roles  & Responsibilities: Yes  Decrease burden of Care through IP rehab admission: NA  Possible need for SNF placement upon discharge: Not anticipated  Patient Condition: I have reviewed medical records from [facility], spoken with CM, and patient and spouse. I met with patient at the bedside and discussed via phone for inpatient rehabilitation assessment.  Patient will benefit from ongoing PT and OT, can actively participate in 3 hours of therapy a day 5 days of the week, and can make measurable gains during the admission.  Patient will also benefit from the coordinated team approach during an Inpatient Acute Rehabilitation admission.  The patient will receive intensive therapy as well as Rehabilitation physician, nursing, social worker, and care management interventions.  Due to bladder management, safety, skin/wound care, disease management, medication administration, pain management, and patient education the patient requires 24 hour a day rehabilitation nursing.  The patient is currently Max A with mobility and basic ADLs.  Discharge setting and therapy post discharge at home with home health is anticipated.  Patient has agreed to participate in the Acute Inpatient Rehabilitation Program and will  admit today.  Preadmission Screen Completed By:  Tinnie SHAUNNA Yvone Delayne, 02/04/2024 10:15 AM with updates by Leita Kleine, MS, CCC-SLP  ______________________________________________________________________   Discussed status with Dr. Urbano on 02/14/24 at 900 and received approval for admission today.  Admission Coordinator:  Tinnie SHAUNNA Yvone Delayne, CCC-SLP, time 1011/Date 02/14/24   Assessment/Plan: Diagnosis: AKA Does the need for close, 24 hr/day Medical supervision in concert with the patient's rehab needs make it unreasonable for this patient to be served in a less intensive setting? Yes Co-Morbidities requiring supervision/potential complications: CAD, GERD, DM2, anemia of  chronic disease, afib, diabetes, leukocytosis Due to bladder management, bowel management, safety, skin/wound care, disease management, medication administration, pain management, and patient education, does the patient require 24 hr/day rehab nursing? Yes Does the patient require coordinated care of a physician, rehab nurse, PT, OT, and SLP to address physical and functional deficits in the context of the above medical diagnosis(es)? Yes Addressing deficits in the following areas: balance, endurance, locomotion, strength, transferring, bowel/bladder control, bathing, dressing, feeding, grooming, toileting, cognition, and psychosocial support Can the patient actively participate in an intensive therapy program of at least 3 hrs of therapy 5 days a week? Yes The potential for patient to make measurable gains while on inpatient rehab is excellent Anticipated functional outcomes upon discharge from inpatient rehab: min assist and mod assist PT, min assist and mod assist OT, n/a SLP Estimated rehab length of stay to reach the above functional goals is: 18-21 Anticipated discharge destination: Home 10. Overall Rehab/Functional Prognosis: good   MD Signature: Murray Urbano     [1]  Current Facility-Administered Medications:    acetaminophen  (TYLENOL ) tablet 650 mg, 650 mg, Oral, TID, Babcock, Peter E, NP, 650 mg at 02/04/24 0947   apixaban  (ELIQUIS ) tablet 5 mg, 5 mg, Oral, BID, Reome, Earle J, RPH, 5 mg at 02/04/24 9051   arformoterol  (BROVANA ) nebulizer solution 15 mcg, 15 mcg, Nebulization, BID, Babcock, Peter E, NP, 15 mcg at 02/03/24 2027   ascorbic acid  (VITAMIN C ) tablet 500 mg, 500 mg, Oral, Q1400, Babcock, Peter E, NP, 500 mg at 02/03/24 1416   aspirin  chewable tablet 81 mg, 81 mg, Oral, Daily, Babcock, Peter E, NP, 81 mg at 02/04/24 0946   atorvastatin  (LIPITOR) tablet 40 mg, 40 mg, Oral, Q lunch, Babcock, Peter E, NP, 40 mg at 02/03/24 1224   Chlorhexidine  Gluconate Cloth 2 % PADS 6  each, 6 each, Topical, Daily, Jenna Maude BRAVO, NP, 6 each at 02/04/24 0948   cholecalciferol  (VITAMIN D3) 25 MCG (1000 UNIT) tablet 2,000 Units, 2,000 Units, Oral, Q1400, Babcock, Peter E, NP, 2,000 Units at 02/03/24 1416   fenofibrate  tablet 54 mg, 54 mg, Oral, Daily, Babcock, Peter E, NP, 54 mg at 02/04/24 0947   furosemide  (LASIX ) injection 40 mg, 40 mg, Intravenous, Q6H, Mallipeddi, Vishnu P, MD, 40 mg at 02/04/24 1005   gabapentin  (NEURONTIN ) capsule 300 mg, 300 mg, Oral, BID, Babcock, Peter E, NP, 300 mg at 02/04/24 9052   HYDROmorphone  (DILAUDID ) injection 0.5 mg, 0.5 mg, Intravenous, Q2H PRN, Babcock, Peter E, NP, 0.5 mg at 02/01/24 2139   insulin  aspart (novoLOG ) injection 0-5 Units, 0-5 Units, Subcutaneous, QHS, Khatri, Pardeep, MD   insulin  aspart (novoLOG ) injection 0-9 Units, 0-9 Units, Subcutaneous, TID WC, Leotis Bogus, MD, 1 Units at 02/04/24 0558   insulin  aspart (novoLOG ) injection 3 Units, 3 Units, Subcutaneous, TID WC, Leotis Bogus, MD, 3 Units at 02/04/24 0744   insulin  glargine (LANTUS ) injection 10 Units, 10 Units, Subcutaneous,  Daily, Gretta Leita SQUIBB, DO, 10 Units at 02/04/24 0948   ipratropium (ATROVENT ) 0.06 % nasal spray 2 spray, 2 spray, Each Nare, TID, Jenna Maude BRAVO, NP, 2 spray at 02/04/24 9050   LORazepam  (ATIVAN ) tablet 0.5 mg, 0.5 mg, Oral, Q6H PRN, Babcock, Peter E, NP   losartan  (COZAAR ) tablet 25 mg, 25 mg, Oral, Daily, Adams, Zane, PA-C, 25 mg at 02/04/24 9051   metoprolol  succinate (TOPROL -XL) 24 hr tablet 25 mg, 25 mg, Oral, Daily, Adams, Zane, PA-C, 25 mg at 02/04/24 0947   metoprolol  tartrate (LOPRESSOR ) injection 2.5 mg, 2.5 mg, Intravenous, Q6H PRN, Leotis Bogus, MD   multivitamin with minerals tablet 1 tablet, 1 tablet, Oral, Daily, Babcock, Peter E, NP, 1 tablet at 02/04/24 9052   [DISCONTINUED] ondansetron  (ZOFRAN ) tablet 4 mg, 4 mg, Oral, Q6H PRN **OR** ondansetron  (ZOFRAN ) injection 4 mg, 4 mg, Intravenous, Q6H PRN, Babcock, Peter E, NP, 4  mg at 02/01/24 1522   Oral care mouth rinse, 15 mL, Mouth Rinse, PRN, Jenna Maude BRAVO, NP   pantoprazole  (PROTONIX ) injection 40 mg, 40 mg, Intravenous, Daily, Babcock, Peter E, NP, 40 mg at 02/04/24 0946   polyethylene glycol (MIRALAX  / GLYCOLAX ) packet 17 g, 17 g, Oral, Daily, Jenna Maude BRAVO, NP, 17 g at 02/04/24 9046   predniSONE  (DELTASONE ) tablet 40 mg, 40 mg, Oral, Q breakfast, Gretta Leita P, DO, 40 mg at 02/04/24 0744   revefenacin  (YUPELRI ) nebulizer solution 175 mcg, 175 mcg, Nebulization, Daily, Babcock, Peter E, NP, 175 mcg at 02/03/24 9162   senna (SENOKOT) tablet 8.6 mg, 1 tablet, Oral, BID, Jenna Maude BRAVO, NP, 8.6 mg at 02/04/24 9050   senna-docusate (Senokot-S) tablet 1 tablet, 1 tablet, Oral, QHS PRN, Jenna Maude BRAVO, NP, 1 tablet at 02/03/24 2117   sodium chloride  flush (NS) 0.9 % injection 3 mL, 3 mL, Intravenous, Q12H, Jenna Maude BRAVO, NP, 3 mL at 02/04/24 0949   sodium chloride  flush (NS) 0.9 % injection 3 mL, 3 mL, Intravenous, PRN, Babcock, Peter E, NP   sulfamethoxazole -trimethoprim  (BACTRIM  DS) 800-160 MG per tablet 1 tablet, 1 tablet, Oral, Once per day on Monday Wednesday Friday, Gretta Leita SQUIBB, DO, 1 tablet at 02/02/24 9170   traMADol  (ULTRAM ) tablet 50 mg, 50 mg, Oral, Q6H, Babcock, Peter E, NP, 50 mg at 02/04/24 [id number]

## 2024-02-04 NOTE — Progress Notes (Signed)
 Inpatient Rehab Admissions Coordinator:  Attempted to contact pt's wife, Alisa. No answer. Left a message; awaiting return call. Will continue to follow.   Tinnie Yvone Cohens, MS, CCC-SLP Admissions Coordinator [phone]

## 2024-02-04 NOTE — Progress Notes (Signed)
 " PROGRESS NOTE    Reginald [name]  FMW:[id number] DOB: [birth year] DOA: 01/29/2024 PCP: System, Provider Not In   Brief Narrative:  Reginald [name] is a  89 year old male with history of type 2 diabetes, coronary artery disease status post CABG, interstitial lung disease with chronic hypoxic respiratory failure on 5 L of oxygen at baseline, chronic systolic heart failure with reduced ejection fraction with last known ejection fraction of 25 to 30% and severe peripheral artery disease with right foot gangrene who has recently completed antibiotics for right foot infection 3 weeks back presented to the hospital with worsening pain and redness of the right foot. Patient noticed worsening discoloration of the toes and pain was constantly present even at rest.  Patient was admitted for further evaluation.  Vascular surgery Dr. Silver has evaluated the patient and recommended right AKA.  Postoperatively patient was moved to ICU for observation.  TRH pickup 02/01/2024.  Right AKA appears to be healing well.  Patient cleared from vascular surgery for CIR.  Assessment & Plan:   Principal Problem:   Foot infection Active Problems:   Gangrene of right foot (HCC)   DM II (diabetes mellitus, type II), controlled (HCC)   HFrEF (heart failure with reduced ejection fraction) (HCC)   On mechanically assisted ventilation (HCC)   Acute respiratory failure with hypoxia (HCC)   ILD (interstitial lung disease) (HCC)   Acute on chronic heart failure with preserved ejection fraction (HFpEF) (HCC)   Acute on chronic respiratory failure with hypoxia (HCC)  Right Foot gangrene , status post Right AKA: Severe PAD: Patient is status post right AKA on 01/30/2024 Right AKA appears to be healing well. ID states continue antibiotics 24 hours post amputation then stop. Antibiotics discontinued.  Follow-up in 4 weeks for staple removal. PT and OT recommended CIR.  Acute on chronic hypoxic respiratory failure due to IPF: Patient  follows up at [facility] pulmonary clinic. At baseline he is on 5 L of supplemental oxygen now requiring heated high flow. Continue to wean as tolerated.  He successfully weaned down to 6 L nasal cannula. Hold CellCept, continue increased dose of prednisone . Continue bronchodilators, Brovana , Yupelri . Continued on diuresis.  Acute on chronic HFr EF: Cardiology consulted. - Continue metoprolol , losartan . - Diuresing with IV Lasix  per cardiology.  CAD status post CABG: Continue aspirin , Lipitor, gemfibrozil , metoprolol .   Resumed losartan  25 mg daily.  GERD: Continue pantoprazole .  DM II: Continue Lantus  10 units daily Continue sliding scale.  Anemia of chronic disease: H&H remains stable.  No obvious visible bleeding.  Leukocytosis: Likely due to prednisone .  New onset atrial flutter: Patient heart rate jumped up to 150s, EKG shows new onset atrial flutter. Continue telemetry.  Heart rate is now well-controlled. Cardio consulted.  Continue metoprolol  succinate 25 mg daily Continue Eliquis . Plan is to continue anticoagulation for 3 weeks and doing a cardioversion.  Hypophosphatemia - Will replete as needed.   DVT prophylaxis: Systemic anticoagulation with Eliquis . Code Status: Full code Family Communication: n/a Disposition Plan:    Status is: Inpatient Remains inpatient appropriate because: Patient admitted for ischemic and gangrenous right foot , underwent right AKA POD # 3.  Patient developed new onset A-fib now heart rate controlled.  Started on anticoagulation. Patient is now awaiting CIR.    Consultants:  Vascular surgery Infectious diseases Cardiology  Procedures: Status post right AKA Antimicrobials:  Anti-infectives (From admission, onward)    Start     Dose/Rate Route Frequency Ordered Stop  02/02/24 0900  sulfamethoxazole -trimethoprim  (BACTRIM  DS) 800-160 MG per tablet 1 tablet        1 tablet Oral Once per day on Monday Wednesday Friday 02/01/24 0832      01/30/24 1000  vancomycin  (VANCOREADY) IVPB 1500 mg/300 mL  Status:  Discontinued        1,500 mg 150 mL/hr over 120 Minutes Intravenous Every 24 hours 01/29/24 2001 02/02/24 0736   01/29/24 2100  ceFEPIme  (MAXIPIME ) 2 g in sodium chloride  0.9 % 100 mL IVPB  Status:  Discontinued        2 g 200 mL/hr over 30 Minutes Intravenous Every 8 hours 01/29/24 2001 02/02/24 0736      Subjective: Patient complains of diarrhea since earlier today.  Objective: Vitals:   02/04/24 0700 02/04/24 0800 02/04/24 0947 02/04/24 1134  BP: 115/74 112/77 113/70 103/66  Pulse: 73 70 73 74  Resp: 20 20  19   Temp: 97.7 F (36.5 C)   97.6 F (36.4 C)  TempSrc: Oral   Oral  SpO2: 94% 91%  (!) 89%  Weight:      Height:        Intake/Output Summary (Last 24 hours) at 02/04/2024 1343 Last data filed at 02/04/2024 0949 Gross per 24 hour  Intake 258 ml  Output 750 ml  Net -492 ml   Filed Weights   02/02/24 0300 02/03/24 0500 02/04/24 0300  Weight: 77.1 kg 77.1 kg 75.5 kg    General: Alert, oriented X3  Eyes: Pupils equal, reactive  Oral cavity: moist mucous membranes  Head: Atraumatic, normocephalic  Neck: supple  Chest: Dry crackles bilaterally CVS: S1,S2 RRR. No murmurs  Abd: No distention, soft, non-tender. No masses palpable  Extr: R AKA, pedal edema left lower extremity MSK: No joint deformities or swelling  Neurological: Grossly intact.    Data Reviewed:   CBC: Recent Labs  Lab 01/31/24 0323 02/01/24 0336 02/02/24 0204 02/03/24 0305 02/04/24 0237  WBC 11.3* 14.7* 13.5* 13.5* 13.3*  HGB 10.2* 10.3* 10.2* 10.1* 10.5*  HCT 34.0* 35.4* 34.3* 34.0* 35.0*  MCV 85.9 87.0 86.2 85.4 84.3  PLT 236 322 313 329 395   Basic Metabolic Panel: Recent Labs  Lab 01/29/24 1849 01/30/24 0340 01/31/24 0323 02/01/24 0336 02/02/24 0204 02/03/24 0305 02/04/24 0855  NA 137   < > 137 135 132* 131* 131*  K 4.2   < > 4.1 4.8 4.6 5.2* 5.3*  CL 100   < > 104 101 99 96* 97*  CO2 24   < > 21* 26  24 28 25   GLUCOSE 81   < > 125* 128* 142* 141* 148*  BUN 15   < > 21 21 25* 33* 32*  CREATININE 0.67   < > 0.54* 0.69 0.77 0.73 0.63  CALCIUM  8.9   < > 8.7* 9.3 9.0 9.4 9.8  MG 1.7  --  1.9  --  2.1 2.0  --   PHOS 3.7  --   --   --  2.1* 2.4* 3.1   < > = values in this interval not displayed.   GFR: Estimated Creatinine Clearance: 64.1 mL/min (by C-G formula based on SCr of 0.63 mg/dL). Liver Function Tests: Recent Labs  Lab 02/04/24 0855  AST 20  ALT 23  ALKPHOS 106  BILITOT 0.3  PROT 6.1*  ALBUMIN  3.4*   No results for input(s): LIPASE, AMYLASE in the last 168 hours. No results for input(s): AMMONIA in the last 168 hours. Coagulation Profile: Recent Labs  Lab 01/29/24 1849 01/30/24 0340  INR 1.3* 1.3*   Cardiac Enzymes: No results for input(s): CKTOTAL, CKMB, CKMBINDEX, TROPONINI in the last 168 hours. BNP (last 3 results) No results for input(s): PROBNP in the last 8760 hours. HbA1C: No results for input(s): HGBA1C in the last 72 hours.  CBG: Recent Labs  Lab 02/03/24 1217 02/03/24 1610 02/03/24 2053 02/04/24 0505 02/04/24 1133  GLUCAP 91 125* 125* 125* 135*   Lipid Profile: No results for input(s): CHOL, HDL, LDLCALC, TRIG, CHOLHDL, LDLDIRECT in the last 72 hours. Thyroid Function Tests: No results for input(s): TSH, T4TOTAL, FREET4, T3FREE, THYROIDAB in the last 72 hours. Anemia Panel: No results for input(s): VITAMINB12, FOLATE, FERRITIN, TIBC, IRON, RETICCTPCT in the last 72 hours. Sepsis Labs: No results for input(s): PROCALCITON, LATICACIDVEN in the last 168 hours.  Recent Results (from the past 240 hours)  MRSA Next Gen by PCR, Nasal     Status: None   Collection Time: 01/30/24 10:56 AM   Specimen: Nasal Mucosa; Nasal Swab  Result Value Ref Range Status   MRSA by PCR Next Gen NOT DETECTED NOT DETECTED Final    Comment: (NOTE) The GeneXpert MRSA Assay (FDA approved for NASAL specimens  only), is one component of a comprehensive MRSA colonization surveillance program. It is not intended to diagnose MRSA infection nor to guide or monitor treatment for MRSA infections. Test performance is not FDA approved in patients less than 8 years old. Performed at [facility], [address] [place], [place] [address]     Radiology Studies: No results found.  Scheduled Meds:  acetaminophen   650 mg Oral TID   apixaban   5 mg Oral BID   arformoterol   15 mcg Nebulization BID   ascorbic acid   500 mg Oral Q1400   aspirin   81 mg Oral Daily   atorvastatin   40 mg Oral Q lunch   Chlorhexidine  Gluconate Cloth  6 each Topical Daily   cholecalciferol   2,000 Units Oral Q1400   fenofibrate   54 mg Oral Daily   furosemide   40 mg Intravenous Q6H   gabapentin   300 mg Oral BID   insulin  aspart  0-5 Units Subcutaneous QHS   insulin  aspart  0-9 Units Subcutaneous TID WC   insulin  aspart  3 Units Subcutaneous TID WC   insulin  glargine  10 Units Subcutaneous Daily   ipratropium  2 spray Each Nare TID   losartan   25 mg Oral Daily   metoprolol  succinate  25 mg Oral Daily   multivitamin with minerals  1 tablet Oral Daily   pantoprazole  (PROTONIX ) IV  40 mg Intravenous Daily   polyethylene glycol  17 g Oral Daily   predniSONE   40 mg Oral Q breakfast   revefenacin   175 mcg Nebulization Daily   senna  1 tablet Oral BID   sodium chloride  flush  3 mL Intravenous Q12H   sulfamethoxazole -trimethoprim   1 tablet Oral Once per day on Monday Wednesday Friday   traMADol   50 mg Oral Q6H   Continuous Infusions:     LOS: 6 days    MDALA-GAUSI, Zariya Minner AGATHA, MD [facility]   If 7PM-7AM, please contact night-coverage  "

## 2024-02-05 DIAGNOSIS — I4892 Unspecified atrial flutter: Secondary | ICD-10-CM

## 2024-02-05 DIAGNOSIS — I483 Typical atrial flutter: Secondary | ICD-10-CM

## 2024-02-05 DIAGNOSIS — L089 Local infection of the skin and subcutaneous tissue, unspecified: Secondary | ICD-10-CM | POA: Diagnosis not present

## 2024-02-05 DIAGNOSIS — I5020 Unspecified systolic (congestive) heart failure: Secondary | ICD-10-CM

## 2024-02-05 LAB — BASIC METABOLIC PANEL WITH GFR
Anion gap: 10 (ref 5–15)
BUN: 37 mg/dL — ABNORMAL HIGH (ref 8–23)
CO2: 32 mmol/L (ref 22–32)
Calcium: 9.4 mg/dL (ref 8.9–10.3)
Chloride: 92 mmol/L — ABNORMAL LOW (ref 98–111)
Creatinine, Ser: 0.73 mg/dL (ref 0.61–1.24)
GFR, Estimated: >60 mL/min
Glucose, Bld: 112 mg/dL — ABNORMAL HIGH (ref 70–99)
Potassium: 4.2 mmol/L (ref 3.5–5.1)
Sodium: 134 mmol/L — ABNORMAL LOW (ref 135–145)

## 2024-02-05 LAB — GLUCOSE, CAPILLARY
Glucose-Capillary: 104 mg/dL — ABNORMAL HIGH (ref 70–99)
Glucose-Capillary: 132 mg/dL — ABNORMAL HIGH (ref 70–99)
Glucose-Capillary: 216 mg/dL — ABNORMAL HIGH (ref 70–99)
Glucose-Capillary: 240 mg/dL — ABNORMAL HIGH (ref 70–99)

## 2024-02-05 LAB — CBC
HCT: 35.9 % — ABNORMAL LOW (ref 39.0–52.0)
Hemoglobin: 11 g/dL — ABNORMAL LOW (ref 13.0–17.0)
MCH: 25.3 pg — ABNORMAL LOW (ref 26.0–34.0)
MCHC: 30.6 g/dL (ref 30.0–36.0)
MCV: 82.7 fL (ref 80.0–100.0)
Platelets: 407 K/uL — ABNORMAL HIGH (ref 150–400)
RBC: 4.34 MIL/uL (ref 4.22–5.81)
RDW: 16.8 % — ABNORMAL HIGH (ref 11.5–15.5)
WBC: 14 K/uL — ABNORMAL HIGH (ref 4.0–10.5)
nRBC: 0.6 % — ABNORMAL HIGH (ref 0.0–0.2)

## 2024-02-05 MED ORDER — PANTOPRAZOLE SODIUM 40 MG PO TBEC
40.0000 mg | DELAYED_RELEASE_TABLET | Freq: Every day | ORAL | Status: DC
  Administered 2024-02-05 – 2024-02-14 (×10): 40 mg via ORAL
  Filled 2024-02-05 (×10): qty 1

## 2024-02-05 NOTE — Progress Notes (Signed)
 Physical Therapy Treatment Patient Details Name: Reginald [name] MRN: [id number] DOB: [birth year] Today's Date: 02/05/2024   History of Present Illness Pt is a 89 y.o. male who presented due to R foot pain. 1/13 s/p R AKA PMH: DM2, CAD s/p CABG, chronic hypoxic respiratory failure on 5L o2, HF with reduced EF 25-30%.    PT Comments  The pt was agreeable to session with focus on seated balance and lateral scoot transfers. The pt continues to present with deficits in sitting balance, endurance, posture. The pt required modA to maintain static sitting due to strong posterior and L lateral lean. Pt with poor awareness and needed mod cues to correct. Attempted seated reaching tasks and pt needing min-modA, cues for posture and upright trunk positioning. Pt then attempted lateral scoot with minimal hip clearance and modA to complete small lateral movements. Will continue to follow and progress OOB mobility as tolerated. Pt is showing improvement in respiratory status this session, tolerating 4L O2 at rest, but needing 5-6L with mobility to maintain SpO2 >90%.    If plan is discharge home, recommend the following: A lot of help with walking and/or transfers;A lot of help with bathing/dressing/bathroom;Assistance with cooking/housework;Direct supervision/assist for medications management;Direct supervision/assist for financial management;Assist for transportation;Help with stairs or ramp for entrance   Can travel by private vehicle        Equipment Recommendations  Wheelchair (measurements PT);Wheelchair cushion (measurements PT);Hospital bed;Hoyer lift    Recommendations for Other Services Rehab consult     Precautions / Restrictions Precautions Precautions: Fall Recall of Precautions/Restrictions: Intact Precaution/Restrictions Comments: watch HR, on 4L O2 Restrictions Weight Bearing Restrictions Per Provider Order: Yes RLE Weight Bearing Per Provider Order: Non weight bearing     Mobility  Bed  Mobility Overal bed mobility: Needs Assistance Bed Mobility: Supine to Sit, Sit to Supine     Supine to sit: Mod assist, HOB elevated, Used rails Sit to supine: Min assist, HOB elevated   General bed mobility comments: able to bring LE's off EOB with ModA to raise trunk. Cueing throughout for technique. MinA for return to supine for BLE management, modA of 2 to reposition in supine    Transfers Overall transfer level: Needs assistance Equipment used: None Transfers: Bed to chair/wheelchair/BSC            Lateral/Scoot Transfers: Max assist General transfer comment: maxA with limited hip clearance or lateral movement, assist at trunk due to posterior and L lateral lean as pt fatigued    Ambulation/Gait                   Stairs             Wheelchair Mobility     Tilt Bed    Modified Rankin (Stroke Patients Only)       Balance Overall balance assessment: Needs assistance Sitting-balance support: Bilateral upper extremity supported, Feet supported Sitting balance-Leahy Scale: Poor Sitting balance - Comments: modA at times due to L and posterior lean, minA with UE reaching tasks but back to modA with pt fatigue Postural control: Posterior lean, Left lateral lean                                  Communication Communication Communication: Impaired Factors Affecting Communication: Hearing impaired  Cognition Arousal: Alert Behavior During Therapy: WFL for tasks assessed/performed   PT - Cognitive impairments: Awareness, Attention, Initiation, Problem solving, Safety/Judgement  PT - Cognition Comments: pt following instructions with slightly increased time, leaning to L with poor ability to correct without significant cues. Following commands: Intact      Cueing Cueing Techniques: Verbal cues, Visual cues  Exercises Other Exercises Other Exercises: seated reaches x5 with each UE, minA to maintain  balance Other Exercises: chair push up x5 with modA to correct posterior LOB    General Comments General comments (skin integrity, edema, etc.): SpO2 on 4L at rest with SpO2 90-94%. After transition to EOB, SpO2 to low of 84%, recovered with PLB and seated rest to recover to 88%, then bumped to 5L to reach 90s. upon return to supine to boost in bed, SpO2 to low of 79% on 5L, quickly improved to 94% on 6L with bed upright. then tolerated return to 4L with VSS      Pertinent Vitals/Pain Pain Assessment Pain Assessment: No/denies pain    Home Living                          Prior Function            PT Goals (current goals can now be found in the care plan section) Acute Rehab PT Goals Patient Stated Goal: to return home Progress towards PT goals: Progressing toward goals    Frequency    Min 2X/week      PT Plan      Co-evaluation              AM-PAC PT 6 Clicks Mobility   Outcome Measure  Help needed turning from your back to your side while in a flat bed without using bedrails?: A Little Help needed moving from lying on your back to sitting on the side of a flat bed without using bedrails?: A Lot Help needed moving to and from a bed to a chair (including a wheelchair)?: A Lot Help needed standing up from a chair using your arms (e.g., wheelchair or bedside chair)?: Total Help needed to walk in hospital room?: Total Help needed climbing 3-5 steps with a railing? : Total 6 Click Score: 10    End of Session Equipment Utilized During Treatment: Oxygen Activity Tolerance: Patient tolerated treatment well Patient left: in bed;with call bell/phone within reach;with family/visitor present;with bed alarm set Nurse Communication: Mobility status PT Visit Diagnosis: Unsteadiness on feet (R26.81);Muscle weakness (generalized) (M62.81)     Time: 8778-8742 PT Time Calculation (min) (ACUTE ONLY): 36 min  Charges:    $Therapeutic Exercise: 8-22  mins $Therapeutic Activity: 8-22 mins PT General Charges $$ ACUTE PT VISIT: 1 Visit                     Izetta Call, PT, DPT   Acute Rehabilitation Department Office [phone] Secure Chat Communication Preferred   Izetta JULIANNA Call 02/05/2024, 1:59 PM

## 2024-02-05 NOTE — Progress Notes (Signed)
 "  Rounding Note   Patient Name: Reginald [name] Date of Encounter: 02/05/2024  [facility] Cardiologist: None   Subjective  - No acute events overnight - The patient has no complaints today denying chest pain and SOB  Scheduled Meds:  acetaminophen   650 mg Oral TID   apixaban   5 mg Oral BID   arformoterol   15 mcg Nebulization BID   ascorbic acid   500 mg Oral Q1400   atorvastatin   40 mg Oral Q lunch   Chlorhexidine  Gluconate Cloth  6 each Topical Daily   cholecalciferol   2,000 Units Oral Q1400   fenofibrate   54 mg Oral Daily   furosemide   40 mg Intravenous Q6H   gabapentin   300 mg Oral BID   insulin  aspart  0-5 Units Subcutaneous QHS   insulin  aspart  0-9 Units Subcutaneous TID WC   insulin  aspart  3 Units Subcutaneous TID WC   insulin  glargine  10 Units Subcutaneous Daily   ipratropium  2 spray Each Nare TID   losartan   25 mg Oral Daily   metoprolol  succinate  25 mg Oral Daily   multivitamin with minerals  1 tablet Oral Daily   pantoprazole   40 mg Oral Daily   polyethylene glycol  17 g Oral Daily   predniSONE   40 mg Oral Q breakfast   revefenacin   175 mcg Nebulization Daily   senna  1 tablet Oral BID   sodium chloride  flush  3 mL Intravenous Q12H   sulfamethoxazole -trimethoprim   1 tablet Oral Once per day on Monday Wednesday Friday   traMADol   50 mg Oral Q6H   Continuous Infusions:  PRN Meds: HYDROmorphone  (DILAUDID ) injection, LORazepam , metoprolol  tartrate, [DISCONTINUED] ondansetron  **OR** ondansetron  (ZOFRAN ) IV, mouth rinse, senna-docusate, sodium chloride  flush   Vital Signs  Vitals:   02/05/24 0000 02/05/24 0300 02/05/24 0403 02/05/24 0903  BP: 114/79 108/82 105/65 (!) 94/58  Pulse: 70 74 76 77  Resp: 17 16 20    Temp:   (!) 97.4 F (36.3 C) 98 F (36.7 C)  TempSrc:   Oral Oral  SpO2: 97% 99% 97% 93%  Weight:   77.2 kg   Height:        Intake/Output Summary (Last 24 hours) at 02/05/2024 1133 Last data filed at 02/05/2024 0949 Gross per 24 hour   Intake 717 ml  Output 6300 ml  Net -5583 ml      02/05/2024    4:03 AM 02/04/2024    3:00 AM 02/03/2024    5:00 AM  Last 3 Weights  Weight (lbs) 170 lb 3.1 oz 166 lb 7.2 oz 169 lb 15.6 oz  Weight (kg) 77.2 kg 75.5 kg 77.1 kg      Telemetry Atrial flutter, PVCs- Personally Reviewed  ECG  No new ECG  Physical Exam  GEN: No acute distress.   Neck: No JVD Cardiac: Regular rate with regularly irregular rhythm, no murmurs, rubs, or gallops.  Respiratory: Faint bibasilar rales, no wheezes or rhonchi GI: Soft, nontender, non-distended  MS: No edema; R BKA Neuro:  Nonfocal  Psych: Normal affect   Labs High Sensitivity Troponin:  No results for input(s): TROPONINIHS in the last 720 hours. No results for input(s): TRNPT in the last 720 hours.     Chemistry Recent Labs  Lab 01/31/24 0323 02/01/24 0336 02/02/24 0204 02/03/24 0305 02/04/24 0855 02/05/24 0304  NA 137   < > 132* 131* 131* 134*  K 4.1   < > 4.6 5.2* 5.3* 4.2  CL 104   < >  99 96* 97* 92*  CO2 21*   < > 24 28 25  32  GLUCOSE 125*   < > 142* 141* 148* 112*  BUN 21   < > 25* 33* 32* 37*  CREATININE 0.54*   < > 0.77 0.73 0.63 0.73  CALCIUM  8.7*   < > 9.0 9.4 9.8 9.4  MG 1.9  --  2.1 2.0  --   --   PROT  --   --   --   --  6.1*  --   ALBUMIN   --   --   --   --  3.4*  --   AST  --   --   --   --  20  --   ALT  --   --   --   --  23  --   ALKPHOS  --   --   --   --  106  --   BILITOT  --   --   --   --  0.3  --   GFRNONAA >60   < > >60 >60 >60 >60  ANIONGAP 11   < > 9 7 10 10    < > = values in this interval not displayed.    Lipids No results for input(s): CHOL, TRIG, HDL, LABVLDL, LDLCALC, CHOLHDL in the last 168 hours.  Hematology Recent Labs  Lab 02/03/24 0305 02/04/24 0237 02/05/24 0304  WBC 13.5* 13.3* 14.0*  RBC 3.98* 4.15* 4.34  HGB 10.1* 10.5* 11.0*  HCT 34.0* 35.0* 35.9*  MCV 85.4 84.3 82.7  MCH 25.4* 25.3* 25.3*  MCHC 29.7* 30.0 30.6  RDW 16.6* 16.7* 16.8*  PLT 329 395 407*    Thyroid No results for input(s): TSH, FREET4 in the last 168 hours.  BNPNo results for input(s): BNP, PROBNP in the last 168 hours.  DDimer No results for input(s): DDIMER in the last 168 hours.   Radiology  No results found.  Cardiac Studies  TTE 02/01/24:  IMPRESSIONS     1. Left ventricular ejection fraction, by estimation, is 20 to 25%. The  left ventricle has severely decreased function. The left ventricle  demonstrates global hypokinesis. There is moderate left ventricular  hypertrophy. Left ventricular diastolic  parameters are consistent with Grade II diastolic dysfunction  (pseudonormalization). Elevated left atrial pressure.   2. Right ventricular systolic function is severely reduced. The right  ventricular size is severely enlarged. There is moderately elevated  pulmonary artery systolic pressure.   3. Left atrial size was mildly dilated.   4. Right atrial size was severely dilated.   5. The mitral valve is normal in structure. Trivial mitral valve  regurgitation. No evidence of mitral stenosis. Moderate mitral annular  calcification.   6. The aortic valve is tricuspid. Aortic valve regurgitation is not  visualized. Aortic valve sclerosis/calcification is present, without any  evidence of aortic stenosis.   7. The inferior vena cava is dilated in size with <50% respiratory  variability, suggesting right atrial pressure of 15 mmHg.   Patient Profile   Reginald [name] is a 89 y.o. male with a hx of CAD s/p 3v CABG 2021, ICM (LVEF of 25%, severe RV dysfunction), PAD, HTN, DM, pulmonary HTN, ILD on [place] 5L baseline who is being seen for atrial flutter.  The patient was previously followed by cardiology at [facility]   Assessment & Plan   #New Onset Atrial Flutter - Patient developed new onset atrial flutter in the setting of a recent  BKA surgery. - Initially he was anticoagulated with heparin  but is now transition to Eliquis  and tolerating fine. - He is not a great  candidate for TEE given his chronic hypoxic respiratory failure and risk of respiratory compromise during a TEE. - He is currently rate controlled and asymptomatic. - Will plan for 3 weeks of uninterrupted anticoagulation with consideration of a DCCV at that time if still in atrial flutter. Continue Eliquis  5 mg twice daily Continue metoprolol  succinate 25 mg daily  #Acute on Chronic HFrEF Exacerbation - Patient with known ICM with an LVEF of 20 to 25%. - The patient has been moderately volume overloaded in the setting of his recent surgery and atrial flutter. - I think the patient is approaching euvolemia so we will hopefully get a transition to oral diuretic regimen soon. - He is currently asymptomatic. Continue IV Lasix  40 mg every 6 hours Continue metoprolol  succinate 25 mg daily Continue losartan  25 mg daily His blood pressures have remained soft which has prohibited the uptitration of GDMT.  Will likely only be to tolerate a beta-blocker and ARB at this point. Strict I's and O's Daily weights  #Severe RV Systolic Dysfunction #Pulmonary HTN #Acute on Chronic HRF - Echo gram demonstrates a severely dilated and severely dysfunctional RV in the setting of known pulmonary hypertension. - Diuresing and initiating GDMT as described above.  #CAD s/p CABG #HLD Stopping aspirin  since patient is now on Eliquis  Continue atorvastatin  40 mg daily Continue fenofibrate      For questions or updates, please contact [facility] Please consult [email or web address] for contact info under       Signed, Georganna Archer, MD  02/05/2024, 11:33 AM    "

## 2024-02-05 NOTE — Plan of Care (Signed)
   Problem: Education: Goal: Knowledge of General Education information will improve Description: Including pain rating scale, medication(s)/side effects and non-pharmacologic comfort measures Outcome: Progressing   Problem: Clinical Measurements: Goal: Ability to maintain clinical measurements within normal limits will improve Outcome: Progressing

## 2024-02-05 NOTE — Progress Notes (Signed)
 " PROGRESS NOTE    Reginald [name]  FMW:[id number] DOB: July 13, 1935 DOA: 01/29/2024 PCP: System, Provider Not In   Brief Narrative:  This 89 year old male with history of type 2 diabetes, coronary artery disease status post CABG, interstitial lung disease with chronic hypoxic respiratory failure on 5 L of oxygen at baseline, chronic systolic heart failure with reduced ejection fraction with last known ejection fraction of 25 to 30% and severe peripheral artery disease with right foot gangrene who has recently completed antibiotics for right foot infection 3 weeks back presented to the hospital with worsening pain and redness of the right foot. Patient noticed worsening discoloration of the toes and pain was constantly present even at rest. Patient was admitted for further evaluation. Vascular surgery Dr. Silver has evaluated the patient and recommended right AKA. Postoperatively patient was moved to ICU for observation. TRH pickup 02/01/2024. Right AKA appears to be healing well. Patient cleared from vascular surgery for CIR.   Assessment & Plan:   Principal Problem:   Foot infection Active Problems:   Gangrene of right foot (HCC)   DM II (diabetes mellitus, type II), controlled (HCC)   HFrEF (heart failure with reduced ejection fraction) (HCC)   On mechanically assisted ventilation (HCC)   Acute respiratory failure with hypoxia (HCC)   ILD (interstitial lung disease) (HCC)   Acute on chronic heart failure with preserved ejection fraction (HFpEF) (HCC)   Acute on chronic respiratory failure with hypoxia (HCC)  Right Foot gangrene , status post Right AKA: Severe PAD: Patient is status post right AKA on 01/30/2024 Right AKA appears to be healing well. Antibiotics discontinued.  Follow-up in 4 weeks for staple removal. PT and OT recommended CIR.   Acute on chronic hypoxic respiratory failure due to IPF: Patient follows up at [facility] pulmonary clinic. At baseline he is on 5 L of supplemental oxygen  now requiring heated high flow. Continue to wean as tolerated.  He successfully weaned down to 5 L nasal cannula. Hold CellCept, continue increased dose of prednisone . Continue bronchodilators, Brovana , Yupelri . Continued on diuresis.   Acute on chronic HFr EF: Cardiology consulted. Continue metoprolol , losartan . Hold Spironolactone  daily due to hyperkalemia.  Diuresing with IV Lasix  per cardiology. Continue Lasix  40 mg every 6 hours. Echocardiogram this admission showed LVEF 20 to 25%, G2 DD RV function severely reduced. Needs outpatient ischemic evaluation if functional at baseline.   CAD status post CABG: Continue aspirin , Lipitor, gemfibrozil , metoprolol .   Resumed losartan  25 mg daily.   GERD: Continue pantoprazole .   DM II: Continue Lantus  10 units daily Continue sliding scale.   Anemia of chronic disease: H&H remains stable.  No obvious visible bleeding.   Leukocytosis: Likely due to prednisone .   New onset atrial flutter: Patient heart rate jumped up to 150s, EKG shows new onset atrial flutter. Continue telemetry.  Heart rate is now well-controlled. Cardio consulted.  Continue metoprolol  succinate 25 mg daily Continue Eliquis . Plan is to continue anticoagulation for 3 weeks and doing a cardioversion.   Hypophosphatemia - Will replete as needed.   DVT prophylaxis: Eliquis  Code Status: Full code Family Communication: No family at bed side. Disposition Plan:     Status is: Inpatient Remains inpatient appropriate because: Admitted for ischemic and gangrenous right foot , underwent right AKA POD # 6.  Patient developed new onset A-fib now heart rate controlled.  Started on anticoagulation. Patient is now awaiting CIR.    Consultants:  Cardiology  Procedures: Echo Antimicrobials:  Anti-infectives (  From admission, onward)    Start     Dose/Rate Route Frequency Ordered Stop   02/02/24 0900  sulfamethoxazole -trimethoprim  (BACTRIM  DS) 800-160 MG per tablet 1  tablet        1 tablet Oral Once per day on Monday Wednesday Friday 02/01/24 0832     01/30/24 1000  vancomycin  (VANCOREADY) IVPB 1500 mg/300 mL  Status:  Discontinued        1,500 mg 150 mL/hr over 120 Minutes Intravenous Every 24 hours 01/29/24 2001 02/02/24 0736   01/29/24 2100  ceFEPIme  (MAXIPIME ) 2 g in sodium chloride  0.9 % 100 mL IVPB  Status:  Discontinued        2 g 200 mL/hr over 30 Minutes Intravenous Every 8 hours 01/29/24 2001 02/02/24 0736      Subjective: Patient was seen and examined at bedside.  Overnight events noted. Patient reports feeling much improved.Weaned down to 5 L of supplemental oxygen which is his baseline. Heart rate is controlled.  Objective: Vitals:   02/05/24 0000 02/05/24 0300 02/05/24 0403 02/05/24 0903  BP: 114/79 108/82 105/65 (!) 94/58  Pulse: 70 74 76 77  Resp: 17 16 20    Temp:   (!) 97.4 F (36.3 C) 98 F (36.7 C)  TempSrc:   Oral Oral  SpO2: 97% 99% 97% 93%  Weight:   77.2 kg   Height:        Intake/Output Summary (Last 24 hours) at 02/05/2024 1131 Last data filed at 02/05/2024 0949 Gross per 24 hour  Intake 717 ml  Output 6300 ml  Net -5583 ml   Filed Weights   02/03/24 0500 02/04/24 0300 02/05/24 0403  Weight: 77.1 kg 75.5 kg 77.2 kg    Examination:  General exam: Appears calm and comfortable, not in any acute distress. Respiratory system: CTA Bilaterally. Respiratory effort normal.  RR 16 Cardiovascular system: S1 & S2 heard, RRR. No JVD, murmurs, rubs, gallops or clicks.  Gastrointestinal system: Abdomen is non distended, soft and non tender. Normal bowel sounds heard. Central nervous system: Alert and oriented x 3. No focal neurological deficits. Extremities: No edema, no cyanosis, no clubbing. Skin: No rashes, lesions or ulcers Psychiatry: Judgement and insight appear normal. Mood & affect appropriate.    Data Reviewed: I have personally reviewed following labs and imaging studies  CBC: Recent Labs  Lab  02/01/24 0336 02/02/24 0204 02/03/24 0305 02/04/24 0237 02/05/24 0304  WBC 14.7* 13.5* 13.5* 13.3* 14.0*  HGB 10.3* 10.2* 10.1* 10.5* 11.0*  HCT 35.4* 34.3* 34.0* 35.0* 35.9*  MCV 87.0 86.2 85.4 84.3 82.7  PLT 322 313 329 395 407*   Basic Metabolic Panel: Recent Labs  Lab 01/29/24 1849 01/30/24 0340 01/31/24 0323 02/01/24 0336 02/02/24 0204 02/03/24 0305 02/04/24 0855 02/05/24 0304  NA 137   < > 137 135 132* 131* 131* 134*  K 4.2   < > 4.1 4.8 4.6 5.2* 5.3* 4.2  CL 100   < > 104 101 99 96* 97* 92*  CO2 24   < > 21* 26 24 28 25  32  GLUCOSE 81   < > 125* 128* 142* 141* 148* 112*  BUN 15   < > 21 21 25* 33* 32* 37*  CREATININE 0.67   < > 0.54* 0.69 0.77 0.73 0.63 0.73  CALCIUM  8.9   < > 8.7* 9.3 9.0 9.4 9.8 9.4  MG 1.7  --  1.9  --  2.1 2.0  --   --   PHOS 3.7  --   --   --  2.1* 2.4* 3.1  --    < > = values in this interval not displayed.   GFR: Estimated Creatinine Clearance: 64.1 mL/min (by C-G formula based on SCr of 0.73 mg/dL). Liver Function Tests: Recent Labs  Lab 02/04/24 0855  AST 20  ALT 23  ALKPHOS 106  BILITOT 0.3  PROT 6.1*  ALBUMIN  3.4*   No results for input(s): LIPASE, AMYLASE in the last 168 hours. No results for input(s): AMMONIA in the last 168 hours. Coagulation Profile: Recent Labs  Lab 01/29/24 1849 01/30/24 0340  INR 1.3* 1.3*   Cardiac Enzymes: No results for input(s): CKTOTAL, CKMB, CKMBINDEX, TROPONINI in the last 168 hours. BNP (last 3 results) No results for input(s): PROBNP in the last 8760 hours. HbA1C: No results for input(s): HGBA1C in the last 72 hours. CBG: Recent Labs  Lab 02/04/24 0505 02/04/24 1133 02/04/24 1750 02/04/24 2106 02/05/24 0518  GLUCAP 125* 135* 154* 135* 104*   Lipid Profile: No results for input(s): CHOL, HDL, LDLCALC, TRIG, CHOLHDL, LDLDIRECT in the last 72 hours. Thyroid Function Tests: No results for input(s): TSH, T4TOTAL, FREET4, T3FREE, THYROIDAB  in the last 72 hours. Anemia Panel: No results for input(s): VITAMINB12, FOLATE, FERRITIN, TIBC, IRON, RETICCTPCT in the last 72 hours. Sepsis Labs: No results for input(s): PROCALCITON, LATICACIDVEN in the last 168 hours.  Recent Results (from the past 240 hours)  MRSA Next Gen by PCR, Nasal     Status: None   Collection Time: 01/30/24 10:56 AM   Specimen: Nasal Mucosa; Nasal Swab  Result Value Ref Range Status   MRSA by PCR Next Gen NOT DETECTED NOT DETECTED Final    Comment: (NOTE) The GeneXpert MRSA Assay (FDA approved for NASAL specimens only), is one component of a comprehensive MRSA colonization surveillance program. It is not intended to diagnose MRSA infection nor to guide or monitor treatment for MRSA infections. Test performance is not FDA approved in patients less than 69 years old. Performed at [facility], [address], [place], [place] [address]     Radiology Studies: No results found.  Scheduled Meds:  acetaminophen   650 mg Oral TID   apixaban   5 mg Oral BID   arformoterol   15 mcg Nebulization BID   ascorbic acid   500 mg Oral Q1400   atorvastatin   40 mg Oral Q lunch   Chlorhexidine  Gluconate Cloth  6 each Topical Daily   cholecalciferol   2,000 Units Oral Q1400   fenofibrate   54 mg Oral Daily   furosemide   40 mg Intravenous Q6H   gabapentin   300 mg Oral BID   insulin  aspart  0-5 Units Subcutaneous QHS   insulin  aspart  0-9 Units Subcutaneous TID WC   insulin  aspart  3 Units Subcutaneous TID WC   insulin  glargine  10 Units Subcutaneous Daily   ipratropium  2 spray Each Nare TID   losartan   25 mg Oral Daily   metoprolol  succinate  25 mg Oral Daily   multivitamin with minerals  1 tablet Oral Daily   pantoprazole   40 mg Oral Daily   polyethylene glycol  17 g Oral Daily   predniSONE   40 mg Oral Q breakfast   revefenacin   175 mcg Nebulization Daily   senna  1 tablet Oral BID   sodium chloride  flush  3 mL Intravenous Q12H    sulfamethoxazole -trimethoprim   1 tablet Oral Once per day on Monday Wednesday Friday   traMADol   50 mg Oral Q6H   Continuous Infusions:  LOS: 7 days    Time spent: 50 mins    Darcel Dawley, MD [facility]   If 7PM-7AM, please contact night-coverage  "

## 2024-02-06 DIAGNOSIS — L089 Local infection of the skin and subcutaneous tissue, unspecified: Secondary | ICD-10-CM | POA: Diagnosis not present

## 2024-02-06 DIAGNOSIS — I5033 Acute on chronic diastolic (congestive) heart failure: Secondary | ICD-10-CM

## 2024-02-06 LAB — CBC
HCT: 38.9 % — ABNORMAL LOW (ref 39.0–52.0)
Hemoglobin: 11.8 g/dL — ABNORMAL LOW (ref 13.0–17.0)
MCH: 25.5 pg — ABNORMAL LOW (ref 26.0–34.0)
MCHC: 30.3 g/dL (ref 30.0–36.0)
MCV: 84.2 fL (ref 80.0–100.0)
Platelets: 444 K/uL — ABNORMAL HIGH (ref 150–400)
RBC: 4.62 MIL/uL (ref 4.22–5.81)
RDW: 16.9 % — ABNORMAL HIGH (ref 11.5–15.5)
WBC: 20.2 K/uL — ABNORMAL HIGH (ref 4.0–10.5)
nRBC: 0.7 % — ABNORMAL HIGH (ref 0.0–0.2)

## 2024-02-06 LAB — BASIC METABOLIC PANEL WITH GFR
Anion gap: 8 (ref 5–15)
BUN: 35 mg/dL — ABNORMAL HIGH (ref 8–23)
CO2: 37 mmol/L — ABNORMAL HIGH (ref 22–32)
Calcium: 9.6 mg/dL (ref 8.9–10.3)
Chloride: 89 mmol/L — ABNORMAL LOW (ref 98–111)
Creatinine, Ser: 0.92 mg/dL (ref 0.61–1.24)
GFR, Estimated: >60 mL/min
Glucose, Bld: 88 mg/dL (ref 70–99)
Potassium: 4.1 mmol/L (ref 3.5–5.1)
Sodium: 134 mmol/L — ABNORMAL LOW (ref 135–145)

## 2024-02-06 LAB — GLUCOSE, CAPILLARY
Glucose-Capillary: 112 mg/dL — ABNORMAL HIGH (ref 70–99)
Glucose-Capillary: 140 mg/dL — ABNORMAL HIGH (ref 70–99)
Glucose-Capillary: 167 mg/dL — ABNORMAL HIGH (ref 70–99)

## 2024-02-06 MED ORDER — TORSEMIDE 20 MG PO TABS: 20.0000 mg | ORAL_TABLET | Freq: Every day | ORAL | Status: DC

## 2024-02-06 MED ORDER — TORSEMIDE 20 MG PO TABS
40.0000 mg | ORAL_TABLET | Freq: Every day | ORAL | Status: DC
  Administered 2024-02-06: 40 mg via ORAL
  Filled 2024-02-06: qty 2

## 2024-02-06 NOTE — Progress Notes (Signed)
 "  Rounding Note   Patient Name: Reginald [name] Date of Encounter: 02/06/2024  [facility] Cardiologist: None   Subjective  - Patient states that he did not sleep well last night but has no acute complaints. - Feels like his breathing is improving  Scheduled Meds:  acetaminophen   650 mg Oral TID   apixaban   5 mg Oral BID   arformoterol   15 mcg Nebulization BID   ascorbic acid   500 mg Oral Q1400   atorvastatin   40 mg Oral Q lunch   cholecalciferol   2,000 Units Oral Q1400   fenofibrate   54 mg Oral Daily   gabapentin   300 mg Oral BID   insulin  aspart  0-5 Units Subcutaneous QHS   insulin  aspart  0-9 Units Subcutaneous TID WC   insulin  aspart  3 Units Subcutaneous TID WC   insulin  glargine  10 Units Subcutaneous Daily   ipratropium  2 spray Each Nare TID   losartan   25 mg Oral Daily   metoprolol  succinate  25 mg Oral Daily   multivitamin with minerals  1 tablet Oral Daily   pantoprazole   40 mg Oral Daily   polyethylene glycol  17 g Oral Daily   predniSONE   40 mg Oral Q breakfast   revefenacin   175 mcg Nebulization Daily   senna  1 tablet Oral BID   sodium chloride  flush  3 mL Intravenous Q12H   sulfamethoxazole -trimethoprim   1 tablet Oral Once per day on Monday Wednesday Friday   torsemide   40 mg Oral Daily   traMADol   50 mg Oral Q6H   Continuous Infusions:  PRN Meds: HYDROmorphone  (DILAUDID ) injection, LORazepam , metoprolol  tartrate, [DISCONTINUED] ondansetron  **OR** ondansetron  (ZOFRAN ) IV, mouth rinse, senna-docusate, sodium chloride  flush   Vital Signs  Vitals:   02/05/24 2331 02/06/24 0333 02/06/24 0753 02/06/24 0839  BP: 115/71 102/62  102/81  Pulse: 73 75 72 80  Resp: 18 18 20 17   Temp: 97.7 F (36.5 C) 97.7 F (36.5 C)  98.5 F (36.9 C)  TempSrc: Oral Oral  Oral  SpO2: 93% 93% 90% 94%  Weight:  68.2 kg    Height:        Intake/Output Summary (Last 24 hours) at 02/06/2024 1109 Last data filed at 02/06/2024 0342 Gross per 24 hour  Intake 240 ml   Output 2750 ml  Net -2510 ml      02/06/2024    3:33 AM 02/05/2024    4:03 AM 02/04/2024    3:00 AM  Last 3 Weights  Weight (lbs) 150 lb 5.7 oz 170 lb 3.1 oz 166 lb 7.2 oz  Weight (kg) 68.2 kg 77.2 kg 75.5 kg      Telemetry Atrial flutter, PVCs- Personally Reviewed  ECG  No new ECG  Physical Exam  GEN: No acute distress.   Neck: No JVD Cardiac: Regular rate with regularly irregular rhythm, no murmurs, rubs, or gallops.  Respiratory: Dense Rales heard bilaterally, no wheezes or rhonchi GI: Soft, nontender, non-distended  MS: No edema; R BKA Neuro:  Nonfocal  Psych: Normal affect   Labs High Sensitivity Troponin:  No results for input(s): TROPONINIHS in the last 720 hours. No results for input(s): TRNPT in the last 720 hours.     Chemistry Recent Labs  Lab 01/31/24 0323 02/01/24 0336 02/02/24 0204 02/03/24 0305 02/04/24 0855 02/05/24 0304 02/06/24 0312  NA 137   < > 132* 131* 131* 134* 134*  K 4.1   < > 4.6 5.2* 5.3* 4.2 4.1  CL 104   < > 99 96* 97* 92* 89*  CO2 21*   < > 24 28 25  32 37*  GLUCOSE 125*   < > 142* 141* 148* 112* 88  BUN 21   < > 25* 33* 32* 37* 35*  CREATININE 0.54*   < > 0.77 0.73 0.63 0.73 0.92  CALCIUM  8.7*   < > 9.0 9.4 9.8 9.4 9.6  MG 1.9  --  2.1 2.0  --   --   --   PROT  --   --   --   --  6.1*  --   --   ALBUMIN   --   --   --   --  3.4*  --   --   AST  --   --   --   --  20  --   --   ALT  --   --   --   --  23  --   --   ALKPHOS  --   --   --   --  106  --   --   BILITOT  --   --   --   --  0.3  --   --   GFRNONAA >60   < > >60 >60 >60 >60 >60  ANIONGAP 11   < > 9 7 10 10 8    < > = values in this interval not displayed.    Lipids No results for input(s): CHOL, TRIG, HDL, LABVLDL, LDLCALC, CHOLHDL in the last 168 hours.  Hematology Recent Labs  Lab 02/04/24 0237 02/05/24 0304 02/06/24 0312  WBC 13.3* 14.0* 20.2*  RBC 4.15* 4.34 4.62  HGB 10.5* 11.0* 11.8*  HCT 35.0* 35.9* 38.9*  MCV 84.3 82.7 84.2  MCH 25.3*  25.3* 25.5*  MCHC 30.0 30.6 30.3  RDW 16.7* 16.8* 16.9*  PLT 395 407* 444*   Thyroid No results for input(s): TSH, FREET4 in the last 168 hours.  BNPNo results for input(s): BNP, PROBNP in the last 168 hours.  DDimer No results for input(s): DDIMER in the last 168 hours.   Radiology  No results found.  Cardiac Studies  TTE 02/01/24:  IMPRESSIONS     1. Left ventricular ejection fraction, by estimation, is 20 to 25%. The  left ventricle has severely decreased function. The left ventricle  demonstrates global hypokinesis. There is moderate left ventricular  hypertrophy. Left ventricular diastolic  parameters are consistent with Grade II diastolic dysfunction  (pseudonormalization). Elevated left atrial pressure.   2. Right ventricular systolic function is severely reduced. The right  ventricular size is severely enlarged. There is moderately elevated  pulmonary artery systolic pressure.   3. Left atrial size was mildly dilated.   4. Right atrial size was severely dilated.   5. The mitral valve is normal in structure. Trivial mitral valve  regurgitation. No evidence of mitral stenosis. Moderate mitral annular  calcification.   6. The aortic valve is tricuspid. Aortic valve regurgitation is not  visualized. Aortic valve sclerosis/calcification is present, without any  evidence of aortic stenosis.   7. The inferior vena cava is dilated in size with <50% respiratory  variability, suggesting right atrial pressure of 15 mmHg.   Patient Profile   Reginald [name] is a 89 y.o. male with a hx of CAD s/p 3v CABG 2021, ICM (LVEF of 25%, severe RV dysfunction), PAD, HTN, DM, pulmonary HTN, ILD on [place] 5L baseline who is being seen for atrial  flutter.  The patient was previously followed by [facility]ology at [facility]   Assessment & Plan   #New Onset Atrial Flutter - Patient developed new onset atrial flutter in the setting of a recent BKA surgery. - Initially he was anticoagulated with  heparin  but is now transition to Eliquis  and tolerating fine. - He is not a great candidate for TEE given his chronic hypoxic respiratory failure and risk of respiratory compromise during a TEE. - He is currently rate controlled and asymptomatic. - Will plan for 3 weeks of uninterrupted anticoagulation with consideration of a DCCV at that time if still in atrial flutter. Continue Eliquis  5 mg twice daily Continue metoprolol  succinate 25 mg daily  #Acute on Chronic HFrEF Exacerbation - Patient with known ICM with an LVEF of 20 to 25%. - The patient has been moderately volume overloaded in the setting of his recent surgery and atrial flutter. - I think the patient is now euvolemic. - He is currently asymptomatic. Stop IV Lasix  Transition to torsemide  40 mg p.o. daily Continue metoprolol  succinate 25 mg daily Continue losartan  25 mg daily His blood pressures have remained soft which has prohibited the uptitration of GDMT.  Will likely only be to tolerate a beta-blocker and ARB at this point. Strict I's and O's Daily weights  #Severe RV Systolic Dysfunction #Pulmonary HTN #Acute on Chronic HRF - Echo gram demonstrates a severely dilated and severely dysfunctional RV in the setting of known pulmonary hypertension. - Diuresing and initiating GDMT as described above.  #CAD s/p CABG #HLD Stopping aspirin  since patient is now on Eliquis  Continue atorvastatin  40 mg daily Continue fenofibrate      For questions or updates, please contact [facility] Please consult [email or web address] for contact info under       Signed, Georganna Archer, MD  02/06/2024, 11:09 AM    "

## 2024-02-06 NOTE — Progress Notes (Signed)
 Inpatient Rehab Admissions Coordinator:    CIR following. Discussed plan for support with Pt.'s wife. She will be his primary source of support and can provide min-mod assist at d/c. She also states that they have LTC insurance that will send someone to their house. I encouraged her to reach out to that company to discuss having caregivers for Pt. Post-CIR (beginning in about 2 weeks). I do not have a bed for Pt. Today but will continue to follow for potential admit pending bed availability.  Leita Kleine, MS, CCC-SLP Rehab Admissions Coordinator  [phone] (celll) [phone] (office)

## 2024-02-06 NOTE — Care Management Important Message (Signed)
 Important Message  Patient Details  Name: Reginald [name] MRN: [id number] Date of Birth: 06-Jun-1935   Important Message Given:  Yes - Medicare IM     Vonzell Arrie Sharps 02/06/2024, 8:17 AM

## 2024-02-06 NOTE — Progress Notes (Signed)
 Occupational Therapy Treatment Patient Details Name: Reginald [name] MRN: [id number] DOB: 06/26/1935 Today's Date: 02/06/2024   History of present illness Pt is a 89 y.o. male who presented due to R foot pain. 1/13 s/p R AKA PMH: DM2, CAD s/p CABG, chronic hypoxic respiratory failure on 5L o2, HF with reduced EF 25-30%.   OT comments  Patient received in supine and asking to use BSC due to unable to have a BM on bedpan. Patient was mod assist to get to EOB and min assist for sitting balance. Drop arm BSC not available and patient performed squat pivot transfer to [facility] with max assist. Patient performed several sit to stands with max assist from [facility] for toilet hygiene but required increased time to complete BM and multiple stands to complete cleaning. Patient was max assist to return to EOB and to supine due to fatigue. Patient will benefit from intensive inpatient follow-up therapy, >3 hours/day. Acute OT to continue to follow to address established goals to facilitate DC to next venue of care.        If plan is discharge home, recommend the following:  Two people to help with walking and/or transfers;A lot of help with bathing/dressing/bathroom;Assistance with cooking/housework;Direct supervision/assist for medications management;Assist for transportation;Help with stairs or ramp for entrance   Equipment Recommendations  Other (comment) (defer to next venue)    Recommendations for Other Services      Precautions / Restrictions Precautions Precautions: Fall Recall of Precautions/Restrictions: Intact Precaution/Restrictions Comments: watch HR, on 4L O2 Restrictions Weight Bearing Restrictions Per Provider Order: Yes RLE Weight Bearing Per Provider Order: Non weight bearing       Mobility Bed Mobility Overal bed mobility: Needs Assistance Bed Mobility: Supine to Sit, Sit to Supine     Supine to sit: Mod assist, HOB elevated, Used rails Sit to supine: Max assist, HOB elevated   General  bed mobility comments: max assist to return to supine due to fatigue    Transfers Overall transfer level: Needs assistance Equipment used: None Transfers: Sit to/from Stand, Bed to chair/wheelchair/BSC Sit to Stand: Max assist   Squat pivot transfers: Max assist       General transfer comment: max assist to come to partial stand and pivot to [facility] and back to EOB     Balance Overall balance assessment: Needs assistance Sitting-balance support: Bilateral upper extremity supported, Feet supported Sitting balance-Leahy Scale: Poor Sitting balance - Comments: min to mod assist for sitting on EOB Postural control: Posterior lean Standing balance support: Bilateral upper extremity supported Standing balance-Leahy Scale: Zero Standing balance comment: for BSC transfer                           ADL either performed or assessed with clinical judgement   ADL Overall ADL's : Needs assistance/impaired     Grooming: Wash/dry hands;Wash/dry face;Set up;Sitting                   Toilet Transfer: Maximal assistance;Squat-pivot;BSC/3in1 Toilet Transfer Details (indicate cue type and reason): patient unable to come to complete stand and required max assist to stand and pivot Toileting- Clothing Manipulation and Hygiene: Total assistance;+2 for physical assistance;Sit to/from stand Toileting - Clothing Manipulation Details (indicate cue type and reason): assistance of one to stand and assistance of another for hygiene       General ADL Comments: Patient uanble to have BM on bedpan and patient asked to use [facility]  Extremity/Trunk Assessment              Occupational Psychologist Communication: Impaired Factors Affecting Communication: Hearing impaired   Cognition Arousal: Alert Behavior During Therapy: WFL for tasks assessed/performed Cognition: No apparent impairments                                Following commands: Intact        Cueing   Cueing Techniques: Verbal cues, Visual cues  Exercises      Shoulder Instructions       General Comments at end of session BP 104/65 (78), HR 84 SpO2 95% on 4 liters    Pertinent Vitals/ Pain       Pain Assessment Pain Assessment: No/denies pain  Home Living                                          Prior Functioning/Environment              Frequency  Min 2X/week        Progress Toward Goals  OT Goals(current goals can now be found in the care plan section)  Progress towards OT goals: Progressing toward goals  ADL Goals Pt Will Perform Grooming: with set-up;sitting Pt Will Perform Upper Body Bathing: with set-up;sitting Additional ADL Goal #1: Pt to complete bed mobility with Modified [place] in prep for EOB ADLs Additional ADL Goal #2: Pt to demonstrate scoot/lateral transfers during ADLs/functional tasks with CGA  Plan      Co-evaluation                 AM-PAC OT 6 Clicks Daily Activity     Outcome Measure   Help from another person eating meals?: A Little Help from another person taking care of personal grooming?: A Little Help from another person toileting, which includes using toliet, bedpan, or urinal?: A Lot Help from another person bathing (including washing, rinsing, drying)?: A Lot Help from another person to put on and taking off regular upper body clothing?: A Little Help from another person to put on and taking off regular lower body clothing?: A Lot 6 Click Score: 15    End of Session Equipment Utilized During Treatment: Oxygen (4 liters)  OT Visit Diagnosis: Muscle weakness (generalized) (M62.81);Other (comment) (decreased activity tolerance)   Activity Tolerance Patient tolerated treatment well   Patient Left in bed;with call bell/phone within reach;with bed alarm set;with family/visitor present   Nurse Communication Mobility status;Precautions;Need for  lift equipment        Time: 8864-8783 OT Time Calculation (min): 41 min  Charges: OT General Charges $OT Visit: 1 Visit OT Treatments $Self Care/Home Management : 38-52 mins  Dick Laine, OTA Acute Rehabilitation Services  Office [phone]   Jeb LITTIE Laine 02/06/2024, 2:05 PM

## 2024-02-06 NOTE — Progress Notes (Signed)
 " PROGRESS NOTE    Reginald [name]  FMW:[id number] DOB: 1935-05-04 DOA: 01/29/2024 PCP: System, Provider Not In   Brief Narrative:  This 89 year old male with history of type 2 diabetes, coronary artery disease status post CABG, interstitial lung disease with chronic hypoxic respiratory failure on 5 L of oxygen at baseline, chronic systolic heart failure with reduced ejection fraction with last known ejection fraction of 25 to 30% and severe peripheral artery disease with right foot gangrene who has recently completed antibiotics for right foot infection 3 weeks back presented to the hospital with worsening pain and redness of the right foot. Patient noticed worsening discoloration of the toes and pain was constantly present even at rest. Patient was admitted for further evaluation. Vascular surgery Dr. Silver has evaluated the patient and recommended right AKA. Postoperatively patient was moved to ICU for observation. TRH pickup 02/01/2024. Right AKA appears to be healing well. Patient cleared from vascular surgery for CIR.   Assessment & Plan:   Principal Problem:   Foot infection Active Problems:   Gangrene of right foot (HCC)   DM II (diabetes mellitus, type II), controlled (HCC)   HFrEF (heart failure with reduced ejection fraction) (HCC)   On mechanically assisted ventilation (HCC)   Acute respiratory failure with hypoxia (HCC)   ILD (interstitial lung disease) (HCC)   Acute on chronic heart failure with preserved ejection fraction (HFpEF) (HCC)   Acute on chronic respiratory failure with hypoxia (HCC)   Typical atrial flutter (HCC)  Right Foot gangrene , status post Right AKA: Severe PAD: Patient is status post right AKA on 01/30/2024. Right AKA appears to be healing well. Antibiotics discontinued.  Follow-up in 4 weeks for staple removal. PT and OT recommended CIR.   Acute on chronic hypoxic respiratory failure due to IPF: Patient follows up at [facility] pulmonary clinic. At baseline he is  on 5 L of supplemental oxygen now requiring heated high flow. Continue to wean as tolerated.  He successfully weaned down to 5 L nasal cannula. Hold CellCept, continue increased dose of prednisone . Continue bronchodilators, Brovana , Yupelri . Continued on diuresis.   Acute on chronic HFr EF: Cardiology consulted. Continue metoprolol , losartan . Hold Spironolactone  daily due to hyperkalemia.  Diuresing with IV Lasix  per cardiology. Continued Lasix  40 mg every 6 hours. Echocardiogram this admission showed LVEF 20 to 25%, G2 DD RV function severely reduced. He appears euvolemic now.  IV Lasix  discontinued and started on torsemide  40 mg daily Needs outpatient ischemic evaluation if functional at baseline.   CAD status post CABG: Continue aspirin , Lipitor, gemfibrozil , metoprolol .   Resumed losartan  25 mg daily.   GERD: Continue pantoprazole .   DM II: Continue Lantus  10 units daily Continue sliding scale.   Anemia of chronic disease: H&H remains stable.  No obvious visible bleeding.   Leukocytosis: Likely due to prednisone .   New onset atrial flutter: Patient heart rate jumped up to 150s, EKG shows new onset atrial flutter. Continue telemetry.  Heart rate is now well-controlled. Cardio consulted.  Continue metoprolol  succinate 25 mg daily Continue Eliquis . Plan is to continue anticoagulation uninterrupted for 3 weeks and doing a cardioversion.   Hypophosphatemia - Will replete as needed.   DVT prophylaxis: Eliquis  Code Status: Full code Family Communication: No family at bed side. Disposition Plan:     Status is: Inpatient Remains inpatient appropriate because: Admitted for ischemic and gangrenous right foot , underwent right AKA POD # 6.  Patient developed new onset A-fib now heart rate  controlled.  Started on anticoagulation. Patient is now awaiting CIR.    Consultants:  Cardiology  Procedures: Echo Antimicrobials:  Anti-infectives (From admission, onward)     Start     Dose/Rate Route Frequency Ordered Stop   02/02/24 0900  sulfamethoxazole -trimethoprim  (BACTRIM  DS) 800-160 MG per tablet 1 tablet        1 tablet Oral Once per day on Monday Wednesday Friday 02/01/24 0832     01/30/24 1000  vancomycin  (VANCOREADY) IVPB 1500 mg/300 mL  Status:  Discontinued        1,500 mg 150 mL/hr over 120 Minutes Intravenous Every 24 hours 01/29/24 2001 02/02/24 0736   01/29/24 2100  ceFEPIme  (MAXIPIME ) 2 g in sodium chloride  0.9 % 100 mL IVPB  Status:  Discontinued        2 g 200 mL/hr over 30 Minutes Intravenous Every 8 hours 01/29/24 2001 02/02/24 0736      Subjective: Patient was seen and examined at bedside.  Overnight events noted. Patient reports feeling much improved. Weaned down to 5 L of supplemental oxygen which is his baseline. Heart rate is controlled.  Objective: Vitals:   02/05/24 2331 02/06/24 0333 02/06/24 0753 02/06/24 0839  BP: 115/71 102/62  102/81  Pulse: 73 75 72 80  Resp: 18 18 20 17   Temp: 97.7 F (36.5 C) 97.7 F (36.5 C)  98.5 F (36.9 C)  TempSrc: Oral Oral  Oral  SpO2: 93% 93% 90% 94%  Weight:  68.2 kg    Height:        Intake/Output Summary (Last 24 hours) at 02/06/2024 1148 Last data filed at 02/06/2024 0342 Gross per 24 hour  Intake 240 ml  Output 2750 ml  Net -2510 ml   Filed Weights   02/04/24 0300 02/05/24 0403 02/06/24 0333  Weight: 75.5 kg 77.2 kg 68.2 kg    Examination:  General exam: Appears calm and comfortable, not in any acute distress. Respiratory system: CTA Bilaterally. Respiratory effort normal.  RR 15 Cardiovascular system: S1 & S2 heard, RRR. No JVD, murmurs, rubs, gallops or clicks.  Gastrointestinal system: Abdomen is non distended, soft and non tender. Normal bowel sounds heard. Central nervous system: Alert and oriented x 3. No focal neurological deficits. Extremities: No edema, no cyanosis, no clubbing. Skin: No rashes, lesions or ulcers Psychiatry: Judgement and insight appear normal.  Mood & affect appropriate.    Data Reviewed: I have personally reviewed following labs and imaging studies  CBC: Recent Labs  Lab 02/02/24 0204 02/03/24 0305 02/04/24 0237 02/05/24 0304 02/06/24 0312  WBC 13.5* 13.5* 13.3* 14.0* 20.2*  HGB 10.2* 10.1* 10.5* 11.0* 11.8*  HCT 34.3* 34.0* 35.0* 35.9* 38.9*  MCV 86.2 85.4 84.3 82.7 84.2  PLT 313 329 395 407* 444*   Basic Metabolic Panel: Recent Labs  Lab 01/31/24 0323 02/01/24 0336 02/02/24 0204 02/03/24 0305 02/04/24 0855 02/05/24 0304 02/06/24 0312  NA 137   < > 132* 131* 131* 134* 134*  K 4.1   < > 4.6 5.2* 5.3* 4.2 4.1  CL 104   < > 99 96* 97* 92* 89*  CO2 21*   < > 24 28 25  32 37*  GLUCOSE 125*   < > 142* 141* 148* 112* 88  BUN 21   < > 25* 33* 32* 37* 35*  CREATININE 0.54*   < > 0.77 0.73 0.63 0.73 0.92  CALCIUM  8.7*   < > 9.0 9.4 9.8 9.4 9.6  MG 1.9  --  2.1 2.0  --   --   --  PHOS  --   --  2.1* 2.4* 3.1  --   --    < > = values in this interval not displayed.   GFR: Estimated Creatinine Clearance: 55.6 mL/min (by C-G formula based on SCr of 0.92 mg/dL). Liver Function Tests: Recent Labs  Lab 02/04/24 0855  AST 20  ALT 23  ALKPHOS 106  BILITOT 0.3  PROT 6.1*  ALBUMIN  3.4*   No results for input(s): LIPASE, AMYLASE in the last 168 hours. No results for input(s): AMMONIA in the last 168 hours. Coagulation Profile: No results for input(s): INR, PROTIME in the last 168 hours.  Cardiac Enzymes: No results for input(s): CKTOTAL, CKMB, CKMBINDEX, TROPONINI in the last 168 hours. BNP (last 3 results) No results for input(s): PROBNP in the last 8760 hours. HbA1C: No results for input(s): HGBA1C in the last 72 hours. CBG: Recent Labs  Lab 02/05/24 0518 02/05/24 1251 02/05/24 1711 02/05/24 2112 02/06/24 0608  GLUCAP 104* 132* 216* 240* 112*   Lipid Profile: No results for input(s): CHOL, HDL, LDLCALC, TRIG, CHOLHDL, LDLDIRECT in the last 72 hours. Thyroid  Function Tests: No results for input(s): TSH, T4TOTAL, FREET4, T3FREE, THYROIDAB in the last 72 hours. Anemia Panel: No results for input(s): VITAMINB12, FOLATE, FERRITIN, TIBC, IRON, RETICCTPCT in the last 72 hours. Sepsis Labs: No results for input(s): PROCALCITON, LATICACIDVEN in the last 168 hours.  Recent Results (from the past 240 hours)  MRSA Next Gen by PCR, Nasal     Status: None   Collection Time: 01/30/24 10:56 AM   Specimen: Nasal Mucosa; Nasal Swab  Result Value Ref Range Status   MRSA by PCR Next Gen NOT DETECTED NOT DETECTED Final    Comment: (NOTE) The GeneXpert MRSA Assay (FDA approved for NASAL specimens only), is one component of a comprehensive MRSA colonization surveillance program. It is not intended to diagnose MRSA infection nor to guide or monitor treatment for MRSA infections. Test performance is not FDA approved in patients less than 75 years old. Performed at [facility], [address] [place], [place] [address]     Radiology Studies: No results found.  Scheduled Meds:  acetaminophen   650 mg Oral TID   apixaban   5 mg Oral BID   arformoterol   15 mcg Nebulization BID   ascorbic acid   500 mg Oral Q1400   atorvastatin   40 mg Oral Q lunch   cholecalciferol   2,000 Units Oral Q1400   fenofibrate   54 mg Oral Daily   gabapentin   300 mg Oral BID   insulin  aspart  0-5 Units Subcutaneous QHS   insulin  aspart  0-9 Units Subcutaneous TID WC   insulin  aspart  3 Units Subcutaneous TID WC   insulin  glargine  10 Units Subcutaneous Daily   ipratropium  2 spray Each Nare TID   losartan   25 mg Oral Daily   metoprolol  succinate  25 mg Oral Daily   multivitamin with minerals  1 tablet Oral Daily   pantoprazole   40 mg Oral Daily   polyethylene glycol  17 g Oral Daily   predniSONE   40 mg Oral Q breakfast   revefenacin   175 mcg Nebulization Daily   senna  1 tablet Oral BID   sodium chloride  flush  3 mL Intravenous Q12H    sulfamethoxazole -trimethoprim   1 tablet Oral Once per day on Monday Wednesday Friday   torsemide   40 mg Oral Daily   traMADol   50 mg Oral Q6H   Continuous Infusions:   LOS: 8  days    Time spent: 35 mins    Darcel Dawley, MD [facility]   If 7PM-7AM, please contact night-coverage  "

## 2024-02-07 DIAGNOSIS — J9621 Acute and chronic respiratory failure with hypoxia: Secondary | ICD-10-CM

## 2024-02-07 DIAGNOSIS — L089 Local infection of the skin and subcutaneous tissue, unspecified: Secondary | ICD-10-CM | POA: Diagnosis not present

## 2024-02-07 LAB — MAGNESIUM: Magnesium: 2.1 mg/dL (ref 1.7–2.4)

## 2024-02-07 LAB — GLUCOSE, CAPILLARY
Glucose-Capillary: 112 mg/dL — ABNORMAL HIGH (ref 70–99)
Glucose-Capillary: 132 mg/dL — ABNORMAL HIGH (ref 70–99)
Glucose-Capillary: 218 mg/dL — ABNORMAL HIGH (ref 70–99)
Glucose-Capillary: 237 mg/dL — ABNORMAL HIGH (ref 70–99)

## 2024-02-07 LAB — BASIC METABOLIC PANEL WITH GFR
Anion gap: 8 (ref 5–15)
BUN: 37 mg/dL — ABNORMAL HIGH (ref 8–23)
CO2: 36 mmol/L — ABNORMAL HIGH (ref 22–32)
Calcium: 9.3 mg/dL (ref 8.9–10.3)
Chloride: 91 mmol/L — ABNORMAL LOW (ref 98–111)
Creatinine, Ser: 0.7 mg/dL (ref 0.61–1.24)
GFR, Estimated: >60 mL/min
Glucose, Bld: 153 mg/dL — ABNORMAL HIGH (ref 70–99)
Potassium: 4.4 mmol/L (ref 3.5–5.1)
Sodium: 135 mmol/L (ref 135–145)

## 2024-02-07 MED ORDER — TRAMADOL HCL 50 MG PO TABS
50.0000 mg | ORAL_TABLET | Freq: Four times a day (QID) | ORAL | Status: DC | PRN
  Administered 2024-02-08 – 2024-02-09 (×2): 50 mg via ORAL
  Filled 2024-02-07 (×2): qty 1

## 2024-02-07 MED ORDER — ACETAMINOPHEN 325 MG PO TABS
650.0000 mg | ORAL_TABLET | Freq: Four times a day (QID) | ORAL | Status: DC | PRN
  Administered 2024-02-13 (×2): 650 mg via ORAL
  Filled 2024-02-07 (×2): qty 2

## 2024-02-07 MED ORDER — TORSEMIDE 20 MG PO TABS: 20.0000 mg | ORAL_TABLET | Freq: Every day | ORAL | Status: DC

## 2024-02-07 MED ORDER — SODIUM CHLORIDE 0.9 % IV BOLUS
250.0000 mL | Freq: Once | INTRAVENOUS | Status: AC
  Administered 2024-02-07: 250 mL via INTRAVENOUS

## 2024-02-07 MED ORDER — LOSARTAN POTASSIUM 25 MG PO TABS
12.5000 mg | ORAL_TABLET | Freq: Every day | ORAL | Status: DC
  Administered 2024-02-08 – 2024-02-14 (×7): 12.5 mg via ORAL
  Filled 2024-02-07 (×7): qty 1

## 2024-02-07 MED ORDER — METOPROLOL SUCCINATE ER 25 MG PO TB24
12.5000 mg | ORAL_TABLET | Freq: Every day | ORAL | Status: DC
  Administered 2024-02-08 – 2024-02-14 (×7): 12.5 mg via ORAL
  Filled 2024-02-07 (×7): qty 1

## 2024-02-07 NOTE — Progress Notes (Signed)
 Inpatient Rehab Admissions Coordinator:    CIR following. I do not have a bed for this Pt. Today. Will continue to follow.   Leita Kleine, MS, CCC-SLP Rehab Admissions Coordinator  [phone] (celll) [phone] (office)

## 2024-02-07 NOTE — Progress Notes (Signed)
 Physical Therapy Treatment Patient Details Name: Reginald [name] MRN: [id number] DOB: 1935/09/12 Today's Date: 02/07/2024   History of Present Illness Pt is a 89 y.o. male who presented due to R foot pain. 1/13 s/p R AKA PMH: DM2, CAD s/p CABG, chronic hypoxic respiratory failure on 5L o2, HF with reduced EF 25-30%.    PT Comments  Focused session on transfer training. Once the pt was provided multi-modal repeated cues to push through both his bil arms and his L leg and shift his weight more anteriorly, he was able to clear his buttocks better. This allowed him to progress from needing maxA initially for lateral scoot transfers to only needing modA by the end of the session. He remains at high risk for falls and is limited in functional mobility by deficits in sequencing, problem-solving, generalized strength, endurance, R leg pain, and balance. Will continue to follow acutely.     If plan is discharge home, recommend the following: A lot of help with bathing/dressing/bathroom;Assistance with cooking/housework;Direct supervision/assist for medications management;Direct supervision/assist for financial management;Assist for transportation;Help with stairs or ramp for entrance;Two people to help with walking and/or transfers   Can travel by private Theme Park Manager (measurements PT);Wheelchair cushion (measurements PT);Hospital bed;Hoyer lift;BSC/3in1 (drop-arm [facility])    Recommendations for Other Services Rehab consult     Precautions / Restrictions Precautions Precautions: Fall Recall of Precautions/Restrictions: Intact Precaution/Restrictions Comments: watch HR, watch BP, on 4L O2 Restrictions Weight Bearing Restrictions Per Provider Order: Yes RLE Weight Bearing Per Provider Order: Non weight bearing     Mobility  Bed Mobility Overal bed mobility: Needs Assistance Bed Mobility: Supine to Sit, Sit to Supine     Supine to sit: HOB elevated, Used  rails, Min assist Sit to supine: Mod assist   General bed mobility comments: Cued pt to bring R UE to L bed rail to pull trunk up to sit, minA needed at trunk to sit up L EOB. Pt needed modA to direct trunk and bring legs back up onto the bed with return to supine    Transfers Overall transfer level: Needs assistance Equipment used: None Transfers: Bed to chair/wheelchair/BSC, Sit to/from Stand Sit to Stand: Max assist          Lateral/Scoot Transfers: Mod assist, Max assist General transfer comment: Practiced multiple partial stands from EOB with bil hands on EOB, only clearing his buttocks minimally each rep. He was needing repeated cues to push through his L leg and bring his trunk anteriorly to PT's shoulder to facilitate an anterior weight shift. MaxA to partially stand each rep. Practiced laterally scooting multiple times to L and R along EOB, cuing pt to utilize clearing his buttocks to then slide it alone EOB, progressing from maxA to modA with practice.    Ambulation/Gait               General Gait Details: unable at this time   Stairs             Wheelchair Mobility     Tilt Bed    Modified Rankin (Stroke Patients Only)       Balance Overall balance assessment: Needs assistance Sitting-balance support: Bilateral upper extremity supported, Feet supported Sitting balance-Leahy Scale: Poor Sitting balance - Comments: Pt reliant on UE support and intermittent minA for static sitting balance.       Standing balance comment: unable to come to full stand at this time  Communication Communication Communication: Impaired Factors Affecting Communication: Hearing impaired  Cognition Arousal: Alert Behavior During Therapy: WFL for tasks assessed/performed   PT - Cognitive impairments: Awareness, Attention, Initiation, Problem solving, Safety/Judgement, Sequencing                       PT - Cognition  Comments: Needs extra time to process cues and initiate mobility, could be partially due to being [facility]. Has difficulty problem solving how to sequence offloading his buttocks and eventually scoot, needing step-by-step cuing Following commands: Impaired Following commands impaired: Follows multi-step commands with increased time    Cueing Cueing Techniques: Verbal cues, Visual cues, Tactile cues, Gestural cues  Exercises Amputee Exercises Hip ABduction/ADduction: AROM, Both, 5 reps, Seated, Supine, Right (x5 seated on R, x5 supine bil) Hip Flexion/Marching: AROM, Right, 10 reps, Supine, Seated (x10 seated, x10 supine)    General Comments General comments (skin integrity, edema, etc.): BP soft but stable with SBP in 90s supine and sitting EOB; HR up to 123 bpm; SpO2 stable on 4L      Pertinent Vitals/Pain Pain Assessment Pain Assessment: Faces Faces Pain Scale: Hurts even more Pain Location: R residual limb Pain Descriptors / Indicators: Discomfort, Grimacing, Guarding, Operative site guarding, Sore Pain Intervention(s): Limited activity within patient's tolerance, Monitored during session, Repositioned    Home Living                          Prior Function            PT Goals (current goals can now be found in the care plan section) Acute Rehab PT Goals Patient Stated Goal: to return home PT Goal Formulation: With patient/family Time For Goal Achievement: 02/14/24 Potential to Achieve Goals: Good Progress towards PT goals: Progressing toward goals    Frequency    Min 2X/week      PT Plan      Co-evaluation              AM-PAC PT 6 Clicks Mobility   Outcome Measure  Help needed turning from your back to your side while in a flat bed without using bedrails?: A Little Help needed moving from lying on your back to sitting on the side of a flat bed without using bedrails?: A Little Help needed moving to and from a bed to a chair (including a  wheelchair)?: A Lot Help needed standing up from a chair using your arms (e.g., wheelchair or bedside chair)?: Total Help needed to walk in hospital room?: Total Help needed climbing 3-5 steps with a railing? : Total 6 Click Score: 11    End of Session Equipment Utilized During Treatment: Oxygen Activity Tolerance: Patient tolerated treatment well Patient left: in bed;with call bell/phone within reach;with family/visitor present;with bed alarm set   PT Visit Diagnosis: Unsteadiness on feet (R26.81);Muscle weakness (generalized) (M62.81);Difficulty in walking, not elsewhere classified (R26.2)     Time: 8349-8292 PT Time Calculation (min) (ACUTE ONLY): 17 min  Charges:    $Therapeutic Activity: 8-22 mins PT General Charges $$ ACUTE PT VISIT: 1 Visit                     Theo Ferretti, PT, DPT Acute Rehabilitation Services  Office: [phone]    Theo CHRISTELLA Ferretti 02/07/2024, 5:55 PM

## 2024-02-07 NOTE — Progress Notes (Signed)
 " PROGRESS NOTE    Reginald [name]  FMW:[id number] DOB: 1935/06/24 DOA: 01/29/2024 PCP: System, Provider Not In   Brief Narrative:  This 89 year old male with history of type 2 diabetes, coronary artery disease status post CABG, interstitial lung disease with chronic hypoxic respiratory failure on 5 L of oxygen at baseline, chronic systolic heart failure with reduced ejection fraction with last known ejection fraction of 25 to 30% and severe peripheral artery disease with right foot gangrene who has recently completed antibiotics for right foot infection 3 weeks back presented to the hospital with worsening pain and redness of the right foot. Patient noticed worsening discoloration of the toes and pain was constantly present even at rest. Patient was admitted for further evaluation. Vascular surgery Dr. Silver has evaluated the patient and recommended right AKA. Postoperatively patient was moved to ICU for observation. TRH pickup 02/01/2024. Right AKA appears to be healing well. Patient cleared from vascular surgery for CIR.   Assessment & Plan:   Principal Problem:   Foot infection Active Problems:   Gangrene of right foot (HCC)   DM II (diabetes mellitus, type II), controlled (HCC)   HFrEF (heart failure with reduced ejection fraction) (HCC)   On mechanically assisted ventilation (HCC)   Acute respiratory failure with hypoxia (HCC)   ILD (interstitial lung disease) (HCC)   Acute on chronic heart failure with preserved ejection fraction (HFpEF) (HCC)   Acute on chronic respiratory failure with hypoxia (HCC)   Typical atrial flutter (HCC)  Right Foot gangrene , status post Right AKA: Severe PAD: Patient is status post right AKA on 01/30/2024. Right AKA appears to be healing well. Antibiotics discontinued. Follow-up in 4 weeks for staple removal. PT and OT recommended CIR.   Acute on chronic hypoxic respiratory failure due to IPF: Patient follows up at [facility] pulmonary clinic. At baseline he is  on 5 L of supplemental oxygen now requiring heated high flow. Continue to wean as tolerated.  He successfully weaned down to 5 L nasal cannula. Hold CellCept, continue increased dose of prednisone . Continue bronchodilators, Brovana , Yupelri . Continued on diuresis. Hold torsemide  due to hypotension.   Acute on chronic HFr EF: Cardiology consulted. Continue metoprolol , losartan . Hold Spironolactone  daily due to hyperkalemia.  Diuresing with IV Lasix  per cardiology.  Echocardiogram this admission showed LVEF 20 to 25%, G2 DD RV function severely reduced. He appears euvolemic now.  IV Lasix  discontinued and started on torsemide  40 mg daily. Hold torsemide  due to hypotension. Metoprolol  reduced to 12.5 mg daily. Losartan  reduced to 12.5 mg daily. Needs outpatient ischemic evaluation if functional at baseline.   CAD status post CABG: Continue aspirin , Lipitor, gemfibrozil , metoprolol .   Reduced losartan  12.5 mg daily.   GERD: Continue pantoprazole .   DM II: Continue Lantus  10 units daily Continue sliding scale.   Anemia of chronic disease: H&H remains stable.  No obvious visible bleeding.   Leukocytosis: Likely due to prednisone .   New onset atrial flutter: Patient heart rate jumped up to 150s, EKG shows new onset atrial flutter. Continue telemetry.  Heart rate is now well-controlled. Cardio consulted.  Reduce metoprolol  to 12.5 mg daily  Continue Eliquis . Plan is to continue anticoagulation uninterrupted for 3 weeks and doing a cardioversion.   Hypophosphatemia - Will replete as needed.   DVT prophylaxis: Eliquis  Code Status: Full code Family Communication: No family at bed side. Disposition Plan:     Status is: Inpatient Remains inpatient appropriate because: Admitted for ischemic and gangrenous right foot ,  underwent right AKA.  Patient developed new onset A-fib now heart rate controlled.  Started on anticoagulation. Patient is now awaiting CIR.    Consultants:   Cardiology  Procedures: Echo Antimicrobials:  Anti-infectives (From admission, onward)    Start     Dose/Rate Route Frequency Ordered Stop   02/02/24 0900  sulfamethoxazole -trimethoprim  (BACTRIM  DS) 800-160 MG per tablet 1 tablet        1 tablet Oral Once per day on Monday Wednesday Friday 02/01/24 0832     01/30/24 1000  vancomycin  (VANCOREADY) IVPB 1500 mg/300 mL  Status:  Discontinued        1,500 mg 150 mL/hr over 120 Minutes Intravenous Every 24 hours 01/29/24 2001 02/02/24 0736   01/29/24 2100  ceFEPIme  (MAXIPIME ) 2 g in sodium chloride  0.9 % 100 mL IVPB  Status:  Discontinued        2 g 200 mL/hr over 30 Minutes Intravenous Every 8 hours 01/29/24 2001 02/02/24 0736      Subjective: Patient was seen and examined at bedside.  Overnight events noted. Patient reports feeling much improved. Weaned down to 5 L of supplemental oxygen which is his baseline. Heart rate is controlled.He is awaiting CIR  Objective: Vitals:   02/07/24 0939 02/07/24 1044 02/07/24 1123 02/07/24 1344  BP: (!) 83/48 (!) 87/55 (!) 98/50 100/64  Pulse: 84 65 72 74  Resp:  20 19 19   Temp:   98.4 F (36.9 C)   TempSrc:   Oral   SpO2:  95% 98% 100%  Weight:      Height:        Intake/Output Summary (Last 24 hours) at 02/07/2024 1428 Last data filed at 02/07/2024 1220 Gross per 24 hour  Intake 480 ml  Output 1450 ml  Net -970 ml   Filed Weights   02/05/24 0403 02/06/24 0333 02/07/24 0550  Weight: 77.2 kg 68.2 kg 73.5 kg    Examination:  General exam: Appears calm and comfortable, not in any acute distress. Respiratory system: CTA Bilaterally. Respiratory effort normal.  RR 14 Cardiovascular system: S1 & S2 heard, RRR. No JVD, murmurs, rubs, gallops or clicks.  Gastrointestinal system: Abdomen is non distended, soft and non tender. Normal bowel sounds heard. Central nervous system: Alert and oriented x 3. No focal neurological deficits. Extremities: No edema, no cyanosis, no clubbing. Skin:  No rashes, lesions or ulcers Psychiatry: Judgement and insight appear normal. Mood & affect appropriate.    Data Reviewed: I have personally reviewed following labs and imaging studies  CBC: Recent Labs  Lab 02/02/24 0204 02/03/24 0305 02/04/24 0237 02/05/24 0304 02/06/24 0312  WBC 13.5* 13.5* 13.3* 14.0* 20.2*  HGB 10.2* 10.1* 10.5* 11.0* 11.8*  HCT 34.3* 34.0* 35.0* 35.9* 38.9*  MCV 86.2 85.4 84.3 82.7 84.2  PLT 313 329 395 407* 444*   Basic Metabolic Panel: Recent Labs  Lab 02/02/24 0204 02/03/24 0305 02/04/24 0855 02/05/24 0304 02/06/24 0312 02/07/24 1019  NA 132* 131* 131* 134* 134* 135  K 4.6 5.2* 5.3* 4.2 4.1 4.4  CL 99 96* 97* 92* 89* 91*  CO2 24 28 25  32 37* 36*  GLUCOSE 142* 141* 148* 112* 88 153*  BUN 25* 33* 32* 37* 35* 37*  CREATININE 0.77 0.73 0.63 0.73 0.92 0.70  CALCIUM  9.0 9.4 9.8 9.4 9.6 9.3  MG 2.1 2.0  --   --   --  2.1  PHOS 2.1* 2.4* 3.1  --   --   --    GFR: Estimated  Creatinine Clearance: 64.1 mL/min (by C-G formula based on SCr of 0.7 mg/dL). Liver Function Tests: Recent Labs  Lab 02/04/24 0855  AST 20  ALT 23  ALKPHOS 106  BILITOT 0.3  PROT 6.1*  ALBUMIN  3.4*   No results for input(s): LIPASE, AMYLASE in the last 168 hours. No results for input(s): AMMONIA in the last 168 hours. Coagulation Profile: No results for input(s): INR, PROTIME in the last 168 hours.  Cardiac Enzymes: No results for input(s): CKTOTAL, CKMB, CKMBINDEX, TROPONINI in the last 168 hours. BNP (last 3 results) No results for input(s): PROBNP in the last 8760 hours. HbA1C: No results for input(s): HGBA1C in the last 72 hours. CBG: Recent Labs  Lab 02/06/24 0608 02/06/24 1633 02/06/24 2131 02/07/24 0549 02/07/24 1121  GLUCAP 112* 140* 167* 112* 132*   Lipid Profile: No results for input(s): CHOL, HDL, LDLCALC, TRIG, CHOLHDL, LDLDIRECT in the last 72 hours. Thyroid Function Tests: No results for input(s): TSH,  T4TOTAL, FREET4, T3FREE, THYROIDAB in the last 72 hours. Anemia Panel: No results for input(s): VITAMINB12, FOLATE, FERRITIN, TIBC, IRON, RETICCTPCT in the last 72 hours. Sepsis Labs: No results for input(s): PROCALCITON, LATICACIDVEN in the last 168 hours.  Recent Results (from the past 240 hours)  MRSA Next Gen by PCR, Nasal     Status: None   Collection Time: 01/30/24 10:56 AM   Specimen: Nasal Mucosa; Nasal Swab  Result Value Ref Range Status   MRSA by PCR Next Gen NOT DETECTED NOT DETECTED Final    Comment: (NOTE) The GeneXpert MRSA Assay (FDA approved for NASAL specimens only), is one component of a comprehensive MRSA colonization surveillance program. It is not intended to diagnose MRSA infection nor to guide or monitor treatment for MRSA infections. Test performance is not FDA approved in patients less than 66 years old. Performed at [facility], [address] [place], [place] [address]     Radiology Studies: No results found.  Scheduled Meds:  acetaminophen   650 mg Oral TID   apixaban   5 mg Oral BID   arformoterol   15 mcg Nebulization BID   ascorbic acid   500 mg Oral Q1400   atorvastatin   40 mg Oral Q lunch   cholecalciferol   2,000 Units Oral Q1400   fenofibrate   54 mg Oral Daily   gabapentin   300 mg Oral BID   insulin  aspart  0-5 Units Subcutaneous QHS   insulin  aspart  0-9 Units Subcutaneous TID WC   insulin  aspart  3 Units Subcutaneous TID WC   insulin  glargine  10 Units Subcutaneous Daily   ipratropium  2 spray Each Nare TID   [START ON 02/08/2024] losartan   12.5 mg Oral Daily   [START ON 02/08/2024] metoprolol  succinate  12.5 mg Oral Daily   multivitamin with minerals  1 tablet Oral Daily   pantoprazole   40 mg Oral Daily   polyethylene glycol  17 g Oral Daily   predniSONE   40 mg Oral Q breakfast   revefenacin   175 mcg Nebulization Daily   senna  1 tablet Oral BID   sodium chloride  flush  3 mL Intravenous Q12H    sulfamethoxazole -trimethoprim   1 tablet Oral Once per day on Monday Wednesday Friday   traMADol   50 mg Oral Q6H   Continuous Infusions:   LOS: 9 days    Time spent: 35 mins    Darcel Dawley, MD [facility]   If 7PM-7AM, please contact night-coverage  "

## 2024-02-07 NOTE — Progress Notes (Signed)
 "  Rounding Note   Patient Name: Reginald [name] Date of Encounter: 02/07/2024  [facility] Cardiologist: None   Subjective  - No acute events overnight - Patient without complaints today  Scheduled Meds:  acetaminophen   650 mg Oral TID   apixaban   5 mg Oral BID   arformoterol   15 mcg Nebulization BID   ascorbic acid   500 mg Oral Q1400   atorvastatin   40 mg Oral Q lunch   cholecalciferol   2,000 Units Oral Q1400   fenofibrate   54 mg Oral Daily   gabapentin   300 mg Oral BID   insulin  aspart  0-5 Units Subcutaneous QHS   insulin  aspart  0-9 Units Subcutaneous TID WC   insulin  aspart  3 Units Subcutaneous TID WC   insulin  glargine  10 Units Subcutaneous Daily   ipratropium  2 spray Each Nare TID   [START ON 02/08/2024] losartan   12.5 mg Oral Daily   [START ON 02/08/2024] metoprolol  succinate  12.5 mg Oral Daily   multivitamin with minerals  1 tablet Oral Daily   pantoprazole   40 mg Oral Daily   polyethylene glycol  17 g Oral Daily   predniSONE   40 mg Oral Q breakfast   revefenacin   175 mcg Nebulization Daily   senna  1 tablet Oral BID   sodium chloride  flush  3 mL Intravenous Q12H   sulfamethoxazole -trimethoprim   1 tablet Oral Once per day on Monday Wednesday Friday   traMADol   50 mg Oral Q6H   Continuous Infusions:  PRN Meds: HYDROmorphone  (DILAUDID ) injection, LORazepam , [DISCONTINUED] ondansetron  **OR** ondansetron  (ZOFRAN ) IV, mouth rinse, senna-docusate, sodium chloride  flush   Vital Signs  Vitals:   02/07/24 0936 02/07/24 0939 02/07/24 1044 02/07/24 1123  BP: (!) 83/48 (!) 83/48 (!) 87/55 (!) 98/50  Pulse: 75 84 65   Resp: 19  20 19   Temp:      TempSrc:      SpO2: 98%  95%   Weight:      Height:        Intake/Output Summary (Last 24 hours) at 02/07/2024 1159 Last data filed at 02/07/2024 0800 Gross per 24 hour  Intake 240 ml  Output 1450 ml  Net -1210 ml      02/07/2024    5:50 AM 02/06/2024    3:33 AM 02/05/2024    4:03 AM  Last 3 Weights  Weight  (lbs) 162 lb 150 lb 5.7 oz 170 lb 3.1 oz  Weight (kg) 73.483 kg 68.2 kg 77.2 kg      Telemetry Atrial flutter, PVCs- Personally Reviewed  ECG  No new ECG  Physical Exam  GEN: No acute distress.   Neck: No JVD Cardiac: Regular rate with regularly irregular rhythm, no murmurs, rubs, or gallops.  Respiratory: Dense Rales heard bilaterally, no wheezes or rhonchi GI: Soft, nontender, non-distended  MS: No edema; R BKA Neuro:  Nonfocal  Psych: Normal affect   Labs High Sensitivity Troponin:  No results for input(s): TROPONINIHS in the last 720 hours. No results for input(s): TRNPT in the last 720 hours.     Chemistry Recent Labs  Lab 02/02/24 0204 02/03/24 0305 02/04/24 0855 02/05/24 0304 02/06/24 0312 02/07/24 1019  NA 132* 131* 131* 134* 134* 135  K 4.6 5.2* 5.3* 4.2 4.1 4.4  CL 99 96* 97* 92* 89* 91*  CO2 24 28 25  32 37* 36*  GLUCOSE 142* 141* 148* 112* 88 153*  BUN 25* 33* 32* 37* 35* 37*  CREATININE 0.77 0.73  0.63 0.73 0.92 0.70  CALCIUM  9.0 9.4 9.8 9.4 9.6 9.3  MG 2.1 2.0  --   --   --  2.1  PROT  --   --  6.1*  --   --   --   ALBUMIN   --   --  3.4*  --   --   --   AST  --   --  20  --   --   --   ALT  --   --  23  --   --   --   ALKPHOS  --   --  106  --   --   --   BILITOT  --   --  0.3  --   --   --   GFRNONAA >60 >60 >60 >60 >60 >60  ANIONGAP 9 7 10 10 8 8     Lipids No results for input(s): CHOL, TRIG, HDL, LABVLDL, LDLCALC, CHOLHDL in the last 168 hours.  Hematology Recent Labs  Lab 02/04/24 0237 02/05/24 0304 02/06/24 0312  WBC 13.3* 14.0* 20.2*  RBC 4.15* 4.34 4.62  HGB 10.5* 11.0* 11.8*  HCT 35.0* 35.9* 38.9*  MCV 84.3 82.7 84.2  MCH 25.3* 25.3* 25.5*  MCHC 30.0 30.6 30.3  RDW 16.7* 16.8* 16.9*  PLT 395 407* 444*   Thyroid No results for input(s): TSH, FREET4 in the last 168 hours.  BNPNo results for input(s): BNP, PROBNP in the last 168 hours.  DDimer No results for input(s): DDIMER in the last 168 hours.    Radiology  No results found.  Cardiac Studies  TTE 02/01/24:  IMPRESSIONS     1. Left ventricular ejection fraction, by estimation, is 20 to 25%. The  left ventricle has severely decreased function. The left ventricle  demonstrates global hypokinesis. There is moderate left ventricular  hypertrophy. Left ventricular diastolic  parameters are consistent with Grade II diastolic dysfunction  (pseudonormalization). Elevated left atrial pressure.   2. Right ventricular systolic function is severely reduced. The right  ventricular size is severely enlarged. There is moderately elevated  pulmonary artery systolic pressure.   3. Left atrial size was mildly dilated.   4. Right atrial size was severely dilated.   5. The mitral valve is normal in structure. Trivial mitral valve  regurgitation. No evidence of mitral stenosis. Moderate mitral annular  calcification.   6. The aortic valve is tricuspid. Aortic valve regurgitation is not  visualized. Aortic valve sclerosis/calcification is present, without any  evidence of aortic stenosis.   7. The inferior vena cava is dilated in size with <50% respiratory  variability, suggesting right atrial pressure of 15 mmHg.   Patient Profile   Reginald [name] is a 89 y.o. male with a hx of CAD s/p 3v CABG 2021, ICM (LVEF of 25%, severe RV dysfunction), PAD, HTN, DM, pulmonary HTN, ILD on [place] 5L baseline who is being seen for atrial flutter.  The patient was previously followed by cardiology at [facility]   Assessment & Plan   #New Onset Atrial Flutter - Patient developed new onset atrial flutter in the setting of a recent BKA surgery. - Initially he was anticoagulated with heparin  but is now transition to Eliquis  and tolerating fine. - He is not a great candidate for TEE given his chronic hypoxic respiratory failure and risk of respiratory compromise during a TEE. - He is currently rate controlled and asymptomatic. - Will plan for 3 weeks of uninterrupted  anticoagulation with consideration of a DCCV  at that time if still in atrial flutter. Continue Eliquis  5 mg twice daily Decrease metoprolol  succinate to 12.5 mg daily due to hypotension  #Acute on Chronic HFrEF Exacerbation - Patient with known ICM with an LVEF of 20 to 25%. - The patient has been moderately volume overloaded in the setting of his recent surgery and atrial flutter. - I think the patient is now euvolemic. - He is currently asymptomatic. Hold torsemide  given hypotension Decrease metoprolol  succinate to 12.5 mg daily Decrease losartan  to 12.5 mg daily His blood pressures have remained soft which has prohibited the uptitration of GDMT.  Will likely only be to tolerate a beta-blocker and ARB at this point. Strict I's and O's Daily weights  #Severe RV Systolic Dysfunction #Pulmonary HTN #Acute on Chronic HRF - Echo gram demonstrates a severely dilated and severely dysfunctional RV in the setting of known pulmonary hypertension. - Diuresing and initiating GDMT as described above.  #CAD s/p CABG #HLD Stopping aspirin  since patient is now on Eliquis  Continue atorvastatin  40 mg daily Continue fenofibrate      For questions or updates, please contact [facility] Please consult [email or web address] for contact info under       Signed, Georganna Archer, MD  02/07/2024, 11:59 AM    "

## 2024-02-07 NOTE — Plan of Care (Signed)
  Problem: Clinical Measurements: Goal: Ability to maintain clinical measurements within normal limits will improve Outcome: Progressing Goal: Will remain free from infection Outcome: Progressing Goal: Diagnostic test results will improve Outcome: Progressing Goal: Cardiovascular complication will be avoided Outcome: Progressing   Problem: Activity: Goal: Risk for activity intolerance will decrease Outcome: Progressing   Problem: Nutrition: Goal: Adequate nutrition will be maintained Outcome: Progressing

## 2024-02-08 DIAGNOSIS — L089 Local infection of the skin and subcutaneous tissue, unspecified: Secondary | ICD-10-CM | POA: Diagnosis not present

## 2024-02-08 LAB — GLUCOSE, CAPILLARY
Glucose-Capillary: 87 mg/dL (ref 70–99)
Glucose-Capillary: 89 mg/dL (ref 70–99)
Glucose-Capillary: 193 mg/dL — ABNORMAL HIGH (ref 70–99)
Glucose-Capillary: 158 mg/dL — ABNORMAL HIGH (ref 70–99)

## 2024-02-08 NOTE — Progress Notes (Signed)
 "  Rounding Note   Patient Name: Reginald [name] Date of Encounter: 02/08/2024  [facility] Cardiologist: None   Subjective - The patient received IV fluid yesterday for hypotension - Patient overall has no complaints and says he feels well  Scheduled Meds:  apixaban   5 mg Oral BID   arformoterol   15 mcg Nebulization BID   ascorbic acid   500 mg Oral Q1400   atorvastatin   40 mg Oral Q lunch   cholecalciferol   2,000 Units Oral Q1400   fenofibrate   54 mg Oral Daily   gabapentin   300 mg Oral BID   insulin  aspart  0-5 Units Subcutaneous QHS   insulin  aspart  0-9 Units Subcutaneous TID WC   insulin  aspart  3 Units Subcutaneous TID WC   insulin  glargine  10 Units Subcutaneous Daily   ipratropium  2 spray Each Nare TID   losartan   12.5 mg Oral Daily   metoprolol  succinate  12.5 mg Oral Daily   multivitamin with minerals  1 tablet Oral Daily   pantoprazole   40 mg Oral Daily   polyethylene glycol  17 g Oral Daily   predniSONE   40 mg Oral Q breakfast   revefenacin   175 mcg Nebulization Daily   senna  1 tablet Oral BID   sodium chloride  flush  3 mL Intravenous Q12H   sulfamethoxazole -trimethoprim   1 tablet Oral Once per day on Monday Wednesday Friday   Continuous Infusions:  PRN Meds: acetaminophen , HYDROmorphone  (DILAUDID ) injection, LORazepam , [DISCONTINUED] ondansetron  **OR** ondansetron  (ZOFRAN ) IV, mouth rinse, senna-docusate, sodium chloride  flush, traMADol    Vital Signs  Vitals:   02/08/24 0837 02/08/24 0927 02/08/24 1109 02/08/24 1132  BP: (!) 114/56 108/65 97/68 97/68   Pulse: 89 88 80   Resp: (!) 22  18   Temp: 98.2 F (36.8 C)   98.6 F (37 C)  TempSrc: Oral   Oral  SpO2: 94%  99%   Weight:      Height:        Intake/Output Summary (Last 24 hours) at 02/08/2024 1138 Last data filed at 02/08/2024 9176 Gross per 24 hour  Intake 600 ml  Output 2000 ml  Net -1400 ml      02/08/2024    5:00 AM 02/07/2024    5:50 AM 02/06/2024    3:33 AM  Last 3 Weights   Weight (lbs) 170 lb 162 lb 150 lb 5.7 oz  Weight (kg) 77.111 kg 73.483 kg 68.2 kg      Telemetry Atrial flutter rate controlled- Personally Reviewed  ECG  No new ECG  Physical Exam  GEN: No acute distress.   Neck: No JVD Cardiac: Regular rate with regularly irregular rhythm, no murmurs, rubs, or gallops.  Respiratory: Dense Rales heard bilaterally, no wheezes or rhonchi GI: Soft, nontender, non-distended  MS: No edema; R BKA Neuro:  Nonfocal  Psych: Normal affect   Labs High Sensitivity Troponin:  No results for input(s): TROPONINIHS in the last 720 hours. No results for input(s): TRNPT in the last 720 hours.     Chemistry Recent Labs  Lab 02/02/24 0204 02/03/24 0305 02/04/24 0855 02/05/24 0304 02/06/24 0312 02/07/24 1019  NA 132* 131* 131* 134* 134* 135  K 4.6 5.2* 5.3* 4.2 4.1 4.4  CL 99 96* 97* 92* 89* 91*  CO2 24 28 25  32 37* 36*  GLUCOSE 142* 141* 148* 112* 88 153*  BUN 25* 33* 32* 37* 35* 37*  CREATININE 0.77 0.73 0.63 0.73 0.92 0.70  CALCIUM  9.0 9.4  9.8 9.4 9.6 9.3  MG 2.1 2.0  --   --   --  2.1  PROT  --   --  6.1*  --   --   --   ALBUMIN   --   --  3.4*  --   --   --   AST  --   --  20  --   --   --   ALT  --   --  23  --   --   --   ALKPHOS  --   --  106  --   --   --   BILITOT  --   --  0.3  --   --   --   GFRNONAA >60 >60 >60 >60 >60 >60  ANIONGAP 9 7 10 10 8 8     Lipids No results for input(s): CHOL, TRIG, HDL, LABVLDL, LDLCALC, CHOLHDL in the last 168 hours.  Hematology Recent Labs  Lab 02/04/24 0237 02/05/24 0304 02/06/24 0312  WBC 13.3* 14.0* 20.2*  RBC 4.15* 4.34 4.62  HGB 10.5* 11.0* 11.8*  HCT 35.0* 35.9* 38.9*  MCV 84.3 82.7 84.2  MCH 25.3* 25.3* 25.5*  MCHC 30.0 30.6 30.3  RDW 16.7* 16.8* 16.9*  PLT 395 407* 444*   Thyroid No results for input(s): TSH, FREET4 in the last 168 hours.  BNPNo results for input(s): BNP, PROBNP in the last 168 hours.  DDimer No results for input(s): DDIMER in the last 168  hours.   Radiology  No results found.  Cardiac Studies  TTE 02/01/24:   IMPRESSIONS     1. Left ventricular ejection fraction, by estimation, is 20 to 25%. The  left ventricle has severely decreased function. The left ventricle  demonstrates global hypokinesis. There is moderate left ventricular  hypertrophy. Left ventricular diastolic  parameters are consistent with Grade II diastolic dysfunction  (pseudonormalization). Elevated left atrial pressure.   2. Right ventricular systolic function is severely reduced. The right  ventricular size is severely enlarged. There is moderately elevated  pulmonary artery systolic pressure.   3. Left atrial size was mildly dilated.   4. Right atrial size was severely dilated.   5. The mitral valve is normal in structure. Trivial mitral valve  regurgitation. No evidence of mitral stenosis. Moderate mitral annular  calcification.   6. The aortic valve is tricuspid. Aortic valve regurgitation is not  visualized. Aortic valve sclerosis/calcification is present, without any  evidence of aortic stenosis.   7. The inferior vena cava is dilated in size with <50% respiratory  variability, suggesting right atrial pressure of 15 mmHg.   Patient Profile   Reginald [name] is a 89 y.o. male with a hx of CAD s/p 3v CABG 2021, ICM (LVEF of 25%, severe RV dysfunction), PAD, HTN, DM, pulmonary HTN, ILD on [place] 5L baseline who is being seen for atrial flutter. The patient was previously followed by cardiology at [facility]   Assessment & Plan   #New Onset Atrial Flutter - Patient developed new onset atrial flutter in the setting of a recent BKA surgery. - Initially he was anticoagulated with heparin  but is now transition to Eliquis  and tolerating fine. - He is not a great candidate for TEE given his chronic hypoxic respiratory failure and risk of respiratory compromise during a TEE. - He is currently rate controlled and asymptomatic. - Will plan for 3 weeks of  uninterrupted anticoagulation with consideration of a DCCV at that time if still in atrial flutter.  Continue Eliquis  5 mg twice daily Continue metoprolol  succinate 12.5 mg daily   #Acute on Chronic HFrEF Exacerbation - Patient with known ICM with an LVEF of 20 to 25%. - The patient has been moderately volume overloaded in the setting of his recent surgery and atrial flutter. - I think the patient is now euvolemic. - He is currently asymptomatic. Will not send out on a diuretic given his hypotension Continue metoprolol  succinate 12.5 mg daily (hold for SBP <90) Continue losartan  12.5 mg daily (hold for SBP <100) His blood pressures have remained soft which has prohibited the uptitration of GDMT.  Will likely only be to tolerate a beta-blocker and ARB at this point. Strict I's and O's Daily weights   #Severe RV Systolic Dysfunction #Pulmonary HTN #Acute on Chronic HRF - Echo gram demonstrates a severely dilated and severely dysfunctional RV in the setting of known pulmonary hypertension. - Diuresing and initiating GDMT as described above.   #CAD s/p CABG #HLD Stopping aspirin  since patient is now on Eliquis  Continue atorvastatin  40 mg daily Continue fenofibrate     [facility] will sign off.    Medication Recommendations: Continue metoprolol  succinate 12.5 mg daily, continue losartan  12.5 mg daily, continue Eliquis  5 mg twice daily, stop home baby aspirin , stop home Lasix  Other recommendations (labs, testing, etc): Follow-up BMP Follow up as an outpatient: The patient follows with [facility] cardiology and thus the primary team will have to arrange follow-up with them  for questions or updates, please contact [facility] Please consult [email or web address] for contact info under       Signed, Georganna Archer, MD  02/08/2024, 11:38 AM    "

## 2024-02-08 NOTE — Progress Notes (Signed)
 " PROGRESS NOTE    Reginald [name]  FMW:[id number] DOB: [birth year] DOA: 01/29/2024 PCP: System, Provider Not In   Brief Narrative:  This 89 year old male with history of type 2 diabetes, coronary artery disease status post CABG, interstitial lung disease with chronic hypoxic respiratory failure on 5 L of oxygen at baseline, chronic systolic heart failure with reduced ejection fraction with last known ejection fraction of 25 to 30% and severe peripheral artery disease with right foot gangrene who has recently completed antibiotics for right foot infection 3 weeks back presented to the hospital with worsening pain and redness of the right foot. Patient noticed worsening discoloration of the toes and pain was constantly present even at rest. Patient was admitted for further evaluation. Vascular surgery Dr. Silver has evaluated the patient and recommended right AKA. Postoperatively patient was moved to ICU for observation. TRH pickup 02/01/2024. Right AKA appears to be healing well. Patient cleared from vascular surgery for CIR.   Assessment & Plan:   Principal Problem:   Foot infection Active Problems:   Gangrene of right foot (HCC)   DM II (diabetes mellitus, type II), controlled (HCC)   HFrEF (heart failure with reduced ejection fraction) (HCC)   On mechanically assisted ventilation (HCC)   Acute respiratory failure with hypoxia (HCC)   ILD (interstitial lung disease) (HCC)   Acute on chronic heart failure with preserved ejection fraction (HFpEF) (HCC)   Acute on chronic respiratory failure with hypoxia (HCC)   Typical atrial flutter (HCC)  Right Foot gangrene , status post Right AKA: Severe PAD: Patient is status post right AKA on 01/30/2024. Right AKA appears to be healing well. Antibiotics discontinued. Follow-up in 4 weeks for staple removal. PT and OT recommended CIR.   Acute on chronic hypoxic respiratory failure due to IPF: Patient follows up at [facility] pulmonary clinic. At baseline he is  on 5 L of supplemental oxygen now requiring heated high flow. Continue to wean as tolerated.  He successfully weaned down to 5 L nasal cannula. Hold CellCept, continue increased dose of prednisone . Continue bronchodilators, Brovana , Yupelri . Continued on diuresis. Hold torsemide  due to hypotension.   Acute on chronic HFr EF: Cardiology consulted. Continue metoprolol , losartan . Hold Spironolactone  daily due to hyperkalemia.  Diuresing with IV Lasix  per cardiology.  Echocardiogram this admission showed LVEF 20 to 25%, G2 DD RV function severely reduced. He appears euvolemic now.  IV Lasix  discontinued and started on torsemide  40 mg daily. Hold torsemide  due to hypotension. Metoprolol  reduced to 12.5 mg daily. Losartan  reduced to 12.5 mg daily. Needs outpatient ischemic evaluation if functional at baseline. Cardiology signed off. Would not be discharged on diuretics.   CAD status post CABG: Continue aspirin , Lipitor, gemfibrozil , metoprolol .   Reduced losartan  12.5 mg daily.   GERD: Continue pantoprazole .   DM II: Continue Lantus  10 units daily Continue sliding scale.   Anemia of chronic disease: H&H remains stable.  No obvious visible bleeding.   Leukocytosis: Likely due to prednisone .   New onset atrial flutter: Patient heart rate jumped up to 150s, EKG shows new onset atrial flutter. Continue telemetry.  Heart rate is now well-controlled. Cardio consulted.  Reduce metoprolol  to 12.5 mg daily  Continue Eliquis . Plan is to continue anticoagulation uninterrupted for 3 weeks and doing a cardioversion.   Hypophosphatemia - Will replete as needed.   DVT prophylaxis: Eliquis  Code Status: Full code Family Communication: No family at bed side. Disposition Plan:     Status is: Inpatient Remains inpatient  appropriate because: Admitted for ischemic and gangrenous right foot , underwent right AKA.  Patient developed new onset A-fib now heart rate controlled.  Started on  anticoagulation. Patient is now awaiting CIR.    Consultants:  Cardiology  Procedures: Echo Antimicrobials:  Anti-infectives (From admission, onward)    Start     Dose/Rate Route Frequency Ordered Stop   02/02/24 0900  sulfamethoxazole -trimethoprim  (BACTRIM  DS) 800-160 MG per tablet 1 tablet        1 tablet Oral Once per day on Monday Wednesday Friday 02/01/24 0832     01/30/24 1000  vancomycin  (VANCOREADY) IVPB 1500 mg/300 mL  Status:  Discontinued        1,500 mg 150 mL/hr over 120 Minutes Intravenous Every 24 hours 01/29/24 2001 02/02/24 0736   01/29/24 2100  ceFEPIme  (MAXIPIME ) 2 g in sodium chloride  0.9 % 100 mL IVPB  Status:  Discontinued        2 g 200 mL/hr over 30 Minutes Intravenous Every 8 hours 01/29/24 2001 02/02/24 0736      Subjective: Patient was seen and examined at bedside.  Overnight events noted. Patient reports feeling much improved. Weaned down to 5 L of supplemental oxygen which is his baseline. Heart rate is  now controlled. He is awaiting CIR  Objective: Vitals:   02/08/24 1109 02/08/24 1132 02/08/24 1200 02/08/24 1204  BP: 97/68 97/68 94/70    Pulse: 80 77 75 74  Resp: 18 (!) 21 (!) 21 18  Temp:  98.6 F (37 C)    TempSrc:  Oral    SpO2: 99% 97% 98% 100%  Weight:      Height:        Intake/Output Summary (Last 24 hours) at 02/08/2024 1314 Last data filed at 02/08/2024 1202 Gross per 24 hour  Intake 360 ml  Output 1950 ml  Net -1590 ml   Filed Weights   02/06/24 0333 02/07/24 0550 02/08/24 0500  Weight: 68.2 kg 73.5 kg 77.1 kg    Examination:  General exam: Appears calm and comfortable, not in any acute distress. Respiratory system: CTA Bilaterally. Respiratory effort normal.  RR 14 Cardiovascular system: S1 & S2 heard, RRR. No JVD, murmurs, rubs, gallops or clicks.  Gastrointestinal system: Abdomen is non distended, soft and non tender. Normal bowel sounds heard. Central nervous system: Alert and oriented x 3. No focal neurological  deficits. Extremities: Right AKA,  No edema, no cyanosis, no clubbing. Skin: No rashes, lesions or ulcers Psychiatry: Judgement and insight appear normal. Mood & affect appropriate.    Data Reviewed: I have personally reviewed following labs and imaging studies  CBC: Recent Labs  Lab 02/02/24 0204 02/03/24 0305 02/04/24 0237 02/05/24 0304 02/06/24 0312  WBC 13.5* 13.5* 13.3* 14.0* 20.2*  HGB 10.2* 10.1* 10.5* 11.0* 11.8*  HCT 34.3* 34.0* 35.0* 35.9* 38.9*  MCV 86.2 85.4 84.3 82.7 84.2  PLT 313 329 395 407* 444*   Basic Metabolic Panel: Recent Labs  Lab 02/02/24 0204 02/03/24 0305 02/04/24 0855 02/05/24 0304 02/06/24 0312 02/07/24 1019  NA 132* 131* 131* 134* 134* 135  K 4.6 5.2* 5.3* 4.2 4.1 4.4  CL 99 96* 97* 92* 89* 91*  CO2 24 28 25  32 37* 36*  GLUCOSE 142* 141* 148* 112* 88 153*  BUN 25* 33* 32* 37* 35* 37*  CREATININE 0.77 0.73 0.63 0.73 0.92 0.70  CALCIUM  9.0 9.4 9.8 9.4 9.6 9.3  MG 2.1 2.0  --   --   --  2.1  PHOS 2.1*  2.4* 3.1  --   --   --    GFR: Estimated Creatinine Clearance: 64.1 mL/min (by C-G formula based on SCr of 0.7 mg/dL). Liver Function Tests: Recent Labs  Lab 02/04/24 0855  AST 20  ALT 23  ALKPHOS 106  BILITOT 0.3  PROT 6.1*  ALBUMIN  3.4*   No results for input(s): LIPASE, AMYLASE in the last 168 hours. No results for input(s): AMMONIA in the last 168 hours. Coagulation Profile: No results for input(s): INR, PROTIME in the last 168 hours.  Cardiac Enzymes: No results for input(s): CKTOTAL, CKMB, CKMBINDEX, TROPONINI in the last 168 hours. BNP (last 3 results) No results for input(s): PROBNP in the last 8760 hours. HbA1C: No results for input(s): HGBA1C in the last 72 hours. CBG: Recent Labs  Lab 02/07/24 1121 02/07/24 1643 02/07/24 2108 02/08/24 0614 02/08/24 1159  GLUCAP 132* 218* 237* 87 89   Lipid Profile: No results for input(s): CHOL, HDL, LDLCALC, TRIG, CHOLHDL, LDLDIRECT in the  last 72 hours. Thyroid Function Tests: No results for input(s): TSH, T4TOTAL, FREET4, T3FREE, THYROIDAB in the last 72 hours. Anemia Panel: No results for input(s): VITAMINB12, FOLATE, FERRITIN, TIBC, IRON, RETICCTPCT in the last 72 hours. Sepsis Labs: No results for input(s): PROCALCITON, LATICACIDVEN in the last 168 hours.  Recent Results (from the past 240 hours)  MRSA Next Gen by PCR, Nasal     Status: None   Collection Time: 01/30/24 10:56 AM   Specimen: Nasal Mucosa; Nasal Swab  Result Value Ref Range Status   MRSA by PCR Next Gen NOT DETECTED NOT DETECTED Final    Comment: (NOTE) The GeneXpert MRSA Assay (FDA approved for NASAL specimens only), is one component of a comprehensive MRSA colonization surveillance program. It is not intended to diagnose MRSA infection nor to guide or monitor treatment for MRSA infections. Test performance is not FDA approved in patients less than 59 years old. Performed at [facility], [address] [place], [place] [address]     Radiology Studies: No results found.  Scheduled Meds:  apixaban   5 mg Oral BID   arformoterol   15 mcg Nebulization BID   ascorbic acid   500 mg Oral Q1400   atorvastatin   40 mg Oral Q lunch   cholecalciferol   2,000 Units Oral Q1400   fenofibrate   54 mg Oral Daily   gabapentin   300 mg Oral BID   insulin  aspart  0-5 Units Subcutaneous QHS   insulin  aspart  0-9 Units Subcutaneous TID WC   insulin  aspart  3 Units Subcutaneous TID WC   insulin  glargine  10 Units Subcutaneous Daily   ipratropium  2 spray Each Nare TID   losartan   12.5 mg Oral Daily   metoprolol  succinate  12.5 mg Oral Daily   multivitamin with minerals  1 tablet Oral Daily   pantoprazole   40 mg Oral Daily   polyethylene glycol  17 g Oral Daily   predniSONE   40 mg Oral Q breakfast   revefenacin   175 mcg Nebulization Daily   senna  1 tablet Oral BID   sodium chloride  flush  3 mL Intravenous Q12H    sulfamethoxazole -trimethoprim   1 tablet Oral Once per day on Monday Wednesday Friday   Continuous Infusions:   LOS: 10 days    Time spent: 35 mins    Darcel Dawley, MD [facility]   If 7PM-7AM, please contact night-coverage  "

## 2024-02-09 DIAGNOSIS — L089 Local infection of the skin and subcutaneous tissue, unspecified: Secondary | ICD-10-CM | POA: Diagnosis not present

## 2024-02-09 LAB — GLUCOSE, CAPILLARY
Glucose-Capillary: 88 mg/dL (ref 70–99)
Glucose-Capillary: 189 mg/dL — ABNORMAL HIGH (ref 70–99)
Glucose-Capillary: 181 mg/dL — ABNORMAL HIGH (ref 70–99)
Glucose-Capillary: 149 mg/dL — ABNORMAL HIGH (ref 70–99)

## 2024-02-09 NOTE — Progress Notes (Signed)
 Occupational Therapy Treatment Patient Details Name: Reginald [name] MRN: [id number] DOB: Dec 16, 1935 Today's Date: 02/09/2024   History of present illness Pt is a 89 y.o. male who presented due to R foot pain. 1/13 s/p R AKA PMH: DM2, CAD s/p CABG, chronic hypoxic respiratory failure on 5L o2, HF with reduced EF 25-30%.   OT comments  Patient demonstrating good gains with OT treatment with min assist to get to EOB. Patient able to perform grooming, bathing, and dressing seated on EOB with supervision to CGA for sitting balance.  Patient able to participate with LB bathing with lateral leaning and assistance for left foot. Patient will benefit from intensive inpatient follow-up therapy, >3 hours/day.  Acute OT to continue to follow to address established goals to facilitate DC to next venue of care.        If plan is discharge home, recommend the following:  Two people to help with walking and/or transfers;A lot of help with bathing/dressing/bathroom;Assistance with cooking/housework;Direct supervision/assist for medications management;Assist for transportation;Help with stairs or ramp for entrance   Equipment Recommendations  Other (comment) (defer to next venue of care)    Recommendations for Other Services      Precautions / Restrictions Precautions Precautions: Fall Recall of Precautions/Restrictions: Intact Precaution/Restrictions Comments: watch HR, watch BP, on 4L O2 Restrictions Weight Bearing Restrictions Per Provider Order: Yes RLE Weight Bearing Per Provider Order: Non weight bearing       Mobility Bed Mobility Overal bed mobility: Needs Assistance Bed Mobility: Supine to Sit, Sit to Supine     Supine to sit: HOB elevated, Used rails, Min assist Sit to supine: Min assist   General bed mobility comments: assistance with trunk and scooting towards EOB    Transfers Overall transfer level: Needs assistance                 General transfer comment: not attempted      Balance Overall balance assessment: Needs assistance Sitting-balance support: Bilateral upper extremity supported, Feet supported Sitting balance-Leahy Scale: Poor Sitting balance - Comments: progressing to fair, supervision to CGA while seated on EOB performing self care tasks                                   ADL either performed or assessed with clinical judgement   ADL Overall ADL's : Needs assistance/impaired     Grooming: Wash/dry hands;Wash/dry face;Oral care;Set up;Sitting Grooming Details (indicate cue type and reason): on EOB Upper Body Bathing: Minimal assistance;Sitting Upper Body Bathing Details (indicate cue type and reason): assistance for back Lower Body Bathing: Moderate assistance;Sitting/lateral leans Lower Body Bathing Details (indicate cue type and reason): seated on EOB with lateral leaning and assistance for left foot Upper Body Dressing : Minimal assistance;Sitting Upper Body Dressing Details (indicate cue type and reason): gown change                   General ADL Comments: performed self care seated on EOB with supervision for sitting balance    Extremity/Trunk Assessment              Vision       Perception     Praxis     Communication Communication Communication: Impaired Factors Affecting Communication: Hearing impaired   Cognition Arousal: Alert Behavior During Therapy: WFL for tasks assessed/performed Cognition: No apparent impairments  Following commands: Impaired Following commands impaired: Follows multi-step commands with increased time      Cueing   Cueing Techniques: Verbal cues, Visual cues, Tactile cues, Gestural cues  Exercises      Shoulder Instructions       General Comments [name] present and supportive, HR increased to 120 while performing self care seated on EOB, 4 liters O2 with SpO2 94%    Pertinent Vitals/ Pain       Pain  Assessment Pain Assessment: Faces Faces Pain Scale: Hurts little more Pain Location: R residual limb Pain Descriptors / Indicators: Discomfort, Grimacing, Guarding, Operative site guarding, Sore Pain Intervention(s): Limited activity within patient's tolerance, Monitored during session, Repositioned  Home Living                                          Prior Functioning/Environment              Frequency  Min 2X/week        Progress Toward Goals  OT Goals(current goals can now be found in the care plan section)  Progress towards OT goals: Progressing toward goals  Acute Rehab OT Goals Patient Stated Goal: to go to rehab OT Goal Formulation: With patient/family Time For Goal Achievement: 02/14/24 Potential to Achieve Goals: Good  Plan      Co-evaluation                 AM-PAC OT 6 Clicks Daily Activity     Outcome Measure   Help from another person eating meals?: A Little Help from another person taking care of personal grooming?: A Little Help from another person toileting, which includes using toliet, bedpan, or urinal?: A Lot Help from another person bathing (including washing, rinsing, drying)?: A Lot Help from another person to put on and taking off regular upper body clothing?: A Little Help from another person to put on and taking off regular lower body clothing?: A Lot 6 Click Score: 15    End of Session Equipment Utilized During Treatment: Oxygen (4 liters)  OT Visit Diagnosis: Muscle weakness (generalized) (M62.81);Other (comment) (decreased activity tolerance)   Activity Tolerance Patient tolerated treatment well   Patient Left in bed;with call bell/phone within reach;with bed alarm set;with family/visitor present   Nurse Communication Mobility status;Precautions;Need for lift equipment        Time: 9090-9063 OT Time Calculation (min): 27 min  Charges: OT General Charges $OT Visit: 1 Visit OT Treatments $Self  Care/Home Management : 23-37 mins  Dick Laine, OTA Acute Rehabilitation Services  Office [phone]   Jeb LITTIE Laine 02/09/2024, 10:53 AM

## 2024-02-09 NOTE — Progress Notes (Signed)
 " PROGRESS NOTE    Reginald [name]  FMW:[id number] DOB: [birth year] DOA: 01/29/2024 PCP: System, Provider Not In   Brief Narrative:  This 89 year old male with history of type 2 diabetes, coronary artery disease status post CABG, interstitial lung disease with chronic hypoxic respiratory failure on 5 L of oxygen at baseline, chronic systolic heart failure with reduced ejection fraction with last known ejection fraction of 25 to 30% and severe peripheral artery disease with right foot gangrene who has recently completed antibiotics for right foot infection 3 weeks back presented to the hospital with worsening pain and redness of the right foot. Patient noticed worsening discoloration of the toes and pain was constantly present even at rest. Patient was admitted for further evaluation. Vascular surgery Dr. Silver has evaluated the patient and recommended right AKA. Postoperatively patient was moved to ICU for observation. TRH pickup 02/01/2024. Right AKA appears to be healing well. Patient cleared from vascular surgery for CIR.   Assessment & Plan:   Principal Problem:   Foot infection Active Problems:   Gangrene of right foot (HCC)   DM II (diabetes mellitus, type II), controlled (HCC)   HFrEF (heart failure with reduced ejection fraction) (HCC)   On mechanically assisted ventilation (HCC)   Acute respiratory failure with hypoxia (HCC)   ILD (interstitial lung disease) (HCC)   Acute on chronic heart failure with preserved ejection fraction (HFpEF) (HCC)   Acute on chronic respiratory failure with hypoxia (HCC)   Typical atrial flutter (HCC)  Right Foot gangrene , status post Right AKA: Severe PAD: Patient is status post right AKA on 01/30/2024. Right AKA appears to be healing well. Antibiotics discontinued. Follow-up in 4 weeks for staple removal. Vascular signed off. PT and OT recommended CIR.   Acute on chronic hypoxic respiratory failure due to IPF: Patient follows up at [facility] pulmonary  clinic. At baseline he is on 5 L of supplemental oxygen  but required heated high flow upto 15 L/min. Continue to wean as tolerated.  He successfully weaned down to 5 L nasal cannula. Hold CellCept, continue increased dose of prednisone . Continue bronchodilators, Brovana , Yupelri . Continued on diuresis. Hold torsemide  due to hypotension.   Acute on chronic HFrEF: Cardiology consulted. Continue metoprolol , losartan . He was continued on aggressive diuresis. Hold Spironolactone  daily due to hyperkalemia.  Echocardiogram this admission showed LVEF 20 to 25%, G2 DD RV function severely reduced. He appears euvolemic now.  IV Lasix  discontinued and started on torsemide  40 mg daily. Hold torsemide  due to hypotension. Metoprolol  reduced to 12.5 mg daily. Losartan  reduced to 12.5 mg daily. Needs outpatient ischemic evaluation if functional at baseline. Cardiology signed off. Would not be discharged on diuretics.   CAD status post CABG: Continue aspirin , Lipitor, gemfibrozil , metoprolol .   Reduced losartan  12.5 mg daily.   GERD: Continue pantoprazole .   DM II: Continue Lantus  10 units daily Continue sliding scale.   Anemia of chronic disease: H&H remains stable.  No obvious visible bleeding.   Leukocytosis: Likely due to prednisone .   New onset atrial flutter: Patient heart rate jumped up to 150s, EKG shows new onset atrial flutter. Continue telemetry.  Heart rate is now well-controlled. Cardio consulted.  Reduce metoprolol  to 12.5 mg daily  Continue Eliquis . Plan is to continue anticoagulation uninterrupted for 3 weeks and doing a cardioversion outpatient.   Hypophosphatemia: - Will replete as needed.   DVT prophylaxis: Eliquis  Code Status: Full code Family Communication: No family at bed side. Disposition Plan:  Status is: Inpatient Remains inpatient appropriate because: Admitted for ischemic and gangrenous right foot , underwent right AKA.  Patient developed new onset  A-fib,  now heart rate controlled.  Started on anticoagulation. Patient is now awaiting CIR.    Consultants:  Cardiology  Procedures: Echo Antimicrobials:  Anti-infectives (From admission, onward)    Start     Dose/Rate Route Frequency Ordered Stop   02/02/24 0900  sulfamethoxazole -trimethoprim  (BACTRIM  DS) 800-160 MG per tablet 1 tablet        1 tablet Oral Once per day on Monday Wednesday Friday 02/01/24 0832     01/30/24 1000  vancomycin  (VANCOREADY) IVPB 1500 mg/300 mL  Status:  Discontinued        1,500 mg 150 mL/hr over 120 Minutes Intravenous Every 24 hours 01/29/24 2001 02/02/24 0736   01/29/24 2100  ceFEPIme  (MAXIPIME ) 2 g in sodium chloride  0.9 % 100 mL IVPB  Status:  Discontinued        2 g 200 mL/hr over 30 Minutes Intravenous Every 8 hours 01/29/24 2001 02/02/24 0736      Subjective: Patient was seen and examined at bedside.  Overnight events noted. Patient reports feeling much improved. Weaned down to 5 L of supplemental oxygen which is his baseline. Heart rate is  now controlled. He is awaiting CIR, family at bedside.  No concerns  Objective: Vitals:   02/08/24 2357 02/09/24 0349 02/09/24 0808 02/09/24 1202  BP: (!) 105/59 117/73 104/71 99/89  Pulse: 71 74  85  Resp: 20 19 (!) 22 20  Temp: (!) 97.5 F (36.4 C) (!) 97.2 F (36.2 C) 98.4 F (36.9 C) 98.1 F (36.7 C)  TempSrc: Oral Oral Oral Oral  SpO2: 96% 98% 92% 93%  Weight:  75.3 kg    Height:        Intake/Output Summary (Last 24 hours) at 02/09/2024 1211 Last data filed at 02/09/2024 0800 Gross per 24 hour  Intake 720 ml  Output 2050 ml  Net -1330 ml   Filed Weights   02/07/24 0550 02/08/24 0500 02/09/24 0349  Weight: 73.5 kg 77.1 kg 75.3 kg    Examination:  General exam: Appears calm and comfortable, not in any acute distress. Respiratory system: CTA Bilaterally. Respiratory effort normal.  RR 16 Cardiovascular system: S1 & S2 heard, RRR. No JVD, murmurs, rubs, gallops or clicks.   Gastrointestinal system: Abdomen is non distended, soft and non tender. Normal bowel sounds heard. Central nervous system: Alert and oriented x 3. No focal neurological deficits. Extremities: Right AKA,  No edema, no cyanosis, no clubbing. Skin: No rashes, lesions or ulcers Psychiatry: Judgement and insight appear normal. Mood & affect appropriate.    Data Reviewed: I have personally reviewed following labs and imaging studies  CBC: Recent Labs  Lab 02/03/24 0305 02/04/24 0237 02/05/24 0304 02/06/24 0312  WBC 13.5* 13.3* 14.0* 20.2*  HGB 10.1* 10.5* 11.0* 11.8*  HCT 34.0* 35.0* 35.9* 38.9*  MCV 85.4 84.3 82.7 84.2  PLT 329 395 407* 444*   Basic Metabolic Panel: Recent Labs  Lab 02/03/24 0305 02/04/24 0855 02/05/24 0304 02/06/24 0312 02/07/24 1019  NA 131* 131* 134* 134* 135  K 5.2* 5.3* 4.2 4.1 4.4  CL 96* 97* 92* 89* 91*  CO2 28 25 32 37* 36*  GLUCOSE 141* 148* 112* 88 153*  BUN 33* 32* 37* 35* 37*  CREATININE 0.73 0.63 0.73 0.92 0.70  CALCIUM  9.4 9.8 9.4 9.6 9.3  MG 2.0  --   --   --  2.1  PHOS 2.4* 3.1  --   --   --    GFR: Estimated Creatinine Clearance: 64.1 mL/min (by C-G formula based on SCr of 0.7 mg/dL). Liver Function Tests: Recent Labs  Lab 02/04/24 0855  AST 20  ALT 23  ALKPHOS 106  BILITOT 0.3  PROT 6.1*  ALBUMIN  3.4*   No results for input(s): LIPASE, AMYLASE in the last 168 hours. No results for input(s): AMMONIA in the last 168 hours. Coagulation Profile: No results for input(s): INR, PROTIME in the last 168 hours.  Cardiac Enzymes: No results for input(s): CKTOTAL, CKMB, CKMBINDEX, TROPONINI in the last 168 hours. BNP (last 3 results) No results for input(s): PROBNP in the last 8760 hours. HbA1C: No results for input(s): HGBA1C in the last 72 hours. CBG: Recent Labs  Lab 02/08/24 1159 02/08/24 1643 02/08/24 2125 02/09/24 0609 02/09/24 1159  GLUCAP 89 193* 158* 88 189*   Lipid Profile: No results for  input(s): CHOL, HDL, LDLCALC, TRIG, CHOLHDL, LDLDIRECT in the last 72 hours. Thyroid Function Tests: No results for input(s): TSH, T4TOTAL, FREET4, T3FREE, THYROIDAB in the last 72 hours. Anemia Panel: No results for input(s): VITAMINB12, FOLATE, FERRITIN, TIBC, IRON, RETICCTPCT in the last 72 hours. Sepsis Labs: No results for input(s): PROCALCITON, LATICACIDVEN in the last 168 hours.  No results found for this or any previous visit (from the past 240 hours).   Radiology Studies: No results found.  Scheduled Meds:  apixaban   5 mg Oral BID   arformoterol   15 mcg Nebulization BID   ascorbic acid   500 mg Oral Q1400   atorvastatin   40 mg Oral Q lunch   cholecalciferol   2,000 Units Oral Q1400   fenofibrate   54 mg Oral Daily   gabapentin   300 mg Oral BID   insulin  aspart  0-5 Units Subcutaneous QHS   insulin  aspart  0-9 Units Subcutaneous TID WC   insulin  aspart  3 Units Subcutaneous TID WC   insulin  glargine  10 Units Subcutaneous Daily   ipratropium  2 spray Each Nare TID   losartan   12.5 mg Oral Daily   metoprolol  succinate  12.5 mg Oral Daily   multivitamin with minerals  1 tablet Oral Daily   pantoprazole   40 mg Oral Daily   polyethylene glycol  17 g Oral Daily   predniSONE   40 mg Oral Q breakfast   revefenacin   175 mcg Nebulization Daily   senna  1 tablet Oral BID   sodium chloride  flush  3 mL Intravenous Q12H   sulfamethoxazole -trimethoprim   1 tablet Oral Once per day on Monday Wednesday Friday   Continuous Infusions:   LOS: 11 days    Time spent: 50 mins    Darcel Dawley, MD [facility]   If 7PM-7AM, please contact night-coverage  "

## 2024-02-09 NOTE — Progress Notes (Signed)
 Inpatient Rehab Admissions Coordinator:    CIR following. I do not have a bed for this Pt. Today but will follow for potential admit pending availability.   Leita Kleine, MS, CCC-SLP Rehab Admissions Coordinator  [phone] (celll) [phone] (office)

## 2024-02-09 NOTE — Progress Notes (Signed)
 Physical Therapy Treatment Patient Details Name: Reginald [name] MRN: [id number] DOB: [birth year] Today's Date: 02/09/2024   History of Present Illness Pt is a 89 y.o. male who presented to [facility] ED on 1/12 due to R foot pain. 1/13 s/p R AKA PMH: DM2, CAD s/p CABG, chronic hypoxic respiratory failure on 5L o2, HF with reduced EF 25-30%.    PT Comments  Currently, pt is mod A with cues at the trunk to scoot hips forward to EOB. Pt is unable to perform a full sit to stand without excessive L knee flexion and has decreased ability to tuck in the buttock and maintain the trunk in a neutral position. This may be caused by glute max weakness, quad weakness, and hip abductor/adductor muscle weakness that prevents the pt from coming to full stand. Educated pt on exercises to strengthen these muscles and progress towards full sit to stands. Pt still continues to be limited by strength, activity tolerance, balance and mobility impacting his d/c home. Pt still continues to benefit from intensive inpatient rehab >3hrs/day to d/c home. Will continue to follow acutely.   If plan is discharge home, recommend the following: A lot of help with bathing/dressing/bathroom;Assistance with cooking/housework;Direct supervision/assist for medications management;Direct supervision/assist for financial management;Assist for transportation;Help with stairs or ramp for entrance;Two people to help with walking and/or transfers   Can travel by private Theme Park Manager (measurements PT);Wheelchair cushion (measurements PT);Hospital bed;Hoyer lift;BSC/3in1    Recommendations for Other Services       Precautions / Restrictions Precautions Precautions: Fall Recall of Precautions/Restrictions: Intact Precaution/Restrictions Comments: watch HR and O2 Restrictions Weight Bearing Restrictions Per Provider Order: Yes RLE Weight Bearing Per Provider Order: Non weight bearing     Mobility  Bed  Mobility Overal bed mobility: Needs Assistance Bed Mobility: Supine to Sit     Supine to sit: Mod assist, HOB elevated, Used rails     General bed mobility comments: Pt was mod A to EOB with assistance at the trunk to weight shift and scoot towards the edge of bed    Transfers Overall transfer level: Needs assistance Equipment used: Ambulation equipment used Transfers: Sit to/from Stand, Bed to chair/wheelchair/BSC Sit to Stand: Max assist, +2 physical assistance, Via lift equipment, +2 safety/equipment           General transfer comment: Pt was max A +2 using the stedy. Pt need extensive multi modal cues to straighten the LLE and decrease L knee flexion and tuck the buttock in but unable to do so. Pt would turn trunk outwards towards the R needing cues to shift towards the L. Pt continuously leaned forward at the trunk and was unable to come to a full stand despite 3 attempts. Pt also would grab on the side of the steady instead of the handlebar needing verbal cues for correction. x2 from EOB, X2 from stedy flaps Transfer via Lift Equipment: Stedy  Ambulation/Gait               General Gait Details: unable at this time   Optometrist     Tilt Bed    Modified Rankin (Stroke Patients Only)       Balance Overall balance assessment: Needs assistance Sitting-balance support: Feet supported, Single extremity supported Sitting balance-Leahy Scale: Poor Sitting balance - Comments: Pt was CGA sitting at EOB, pt held onto the bed rails  with RUE to stay sitting at EOB.   Standing balance support: Bilateral upper extremity supported, Reliant on assistive device for balance, During functional activity Standing balance-Leahy Scale: Zero Standing balance comment: unable to come to a full stand this session. Pt able to come to a half stand max A X2                            Communication Communication Communication:  Impaired Factors Affecting Communication: Hearing impaired  Cognition Arousal: Alert Behavior During Therapy: Impulsive   PT - Cognitive impairments: Memory, Attention, Initiation, Sequencing, Safety/Judgement                       PT - Cognition Comments: Pt needs extra time to process information and needs to redirecting to remain on task. Has difficulty with sequencing and problem solving to fully stand when using the steady. Pt needs verbal and tactile cues for transfers. Following commands: Impaired Following commands impaired: Follows one step commands inconsistently, Follows multi-step commands inconsistently    Cueing Cueing Techniques: Verbal cues, Tactile cues, Visual cues  Exercises Amputee Exercises Quad Sets: 10 reps, Seated, Strengthening, Left Straight Leg Raises: Strengthening, 10 reps, Seated, Left    General Comments General comments (skin integrity, edema, etc.): Pt wife at bedside. HR increased to 144 while performing transfer and O2 was 74% (unreliable pleth) at 4L during movement therefore was bumped up to 6L and remained above 90% at 6L until end of session. Returned to 4L end of session. Tested L knee extension and MMT was 3+/5. Educated pt on exercises to do while he is in the chair and in bed to strengthen the gluteal muscles, L quad, adductors/abductors muscles. Provided pt and wife with HEP handout with Access Code: [email or web address]      Pertinent Vitals/Pain Pain Assessment Pain Assessment: 0-10 Pain Score: 6  Pain Location: R residual limb Pain Descriptors / Indicators: Grimacing, Guarding, Operative site guarding, Discomfort, Tiring Pain Intervention(s): Limited activity within patient's tolerance, Monitored during session    Home Living                          Prior Function            PT Goals (current goals can now be found in the care plan section) Acute Rehab PT Goals Patient Stated Goal: to go home PT Goal Formulation: With  patient/family Time For Goal Achievement: 02/14/24 Potential to Achieve Goals: Good Progress towards PT goals: Progressing toward goals    Frequency    Min 2X/week      PT Plan      Co-evaluation              AM-PAC PT 6 Clicks Mobility   Outcome Measure  Help needed turning from your back to your side while in a flat bed without using bedrails?: A Lot Help needed moving from lying on your back to sitting on the side of a flat bed without using bedrails?: A Lot Help needed moving to and from a bed to a chair (including a wheelchair)?: Total Help needed standing up from a chair using your arms (e.g., wheelchair or bedside chair)?: Total Help needed to walk in hospital room?: Total Help needed climbing 3-5 steps with a railing? : Total 6 Click Score: 8    End of Session Equipment Utilized During Treatment: Gait belt;Oxygen Activity Tolerance: Patient limited  by fatigue;Patient tolerated treatment well Patient left: in chair;with call bell/phone within reach;with chair alarm set;with family/visitor present Nurse Communication: Mobility status PT Visit Diagnosis: Unsteadiness on feet (R26.81);Muscle weakness (generalized) (M62.81);Difficulty in walking, not elsewhere classified (R26.2)     Time: 8464-8397 PT Time Calculation (min) (ACUTE ONLY): 27 min  Charges:    $Therapeutic Exercise: 8-22 mins $Therapeutic Activity: 8-22 mins PT General Charges $$ ACUTE PT VISIT: 1 Visit                     Murtis CHRISTELLA Ferries, SPT    Aadhav Uhlig 02/09/2024, 5:15 PM

## 2024-02-10 LAB — CBC
HCT: 35.9 % — ABNORMAL LOW (ref 39.0–52.0)
Hemoglobin: 10.5 g/dL — ABNORMAL LOW (ref 13.0–17.0)
MCH: 24.8 pg — ABNORMAL LOW (ref 26.0–34.0)
MCHC: 29.2 g/dL — ABNORMAL LOW (ref 30.0–36.0)
MCV: 84.9 fL (ref 80.0–100.0)
Platelets: 367 10*3/uL (ref 150–400)
RBC: 4.23 MIL/uL (ref 4.22–5.81)
RDW: 17.3 % — ABNORMAL HIGH (ref 11.5–15.5)
WBC: 12.1 10*3/uL — ABNORMAL HIGH (ref 4.0–10.5)
nRBC: 0 % (ref 0.0–0.2)

## 2024-02-10 LAB — GLUCOSE, CAPILLARY
Glucose-Capillary: 97 mg/dL (ref 70–99)
Glucose-Capillary: 153 mg/dL — ABNORMAL HIGH (ref 70–99)
Glucose-Capillary: 181 mg/dL — ABNORMAL HIGH (ref 70–99)
Glucose-Capillary: 163 mg/dL — ABNORMAL HIGH (ref 70–99)

## 2024-02-10 LAB — BASIC METABOLIC PANEL WITH GFR
Anion gap: 8 (ref 5–15)
BUN: 24 mg/dL — ABNORMAL HIGH (ref 8–23)
CO2: 30 mmol/L (ref 22–32)
Calcium: 9.1 mg/dL (ref 8.9–10.3)
Chloride: 97 mmol/L — ABNORMAL LOW (ref 98–111)
Creatinine, Ser: 0.61 mg/dL (ref 0.61–1.24)
GFR, Estimated: >60 mL/min
Glucose, Bld: 101 mg/dL — ABNORMAL HIGH (ref 70–99)
Potassium: 4.7 mmol/L (ref 3.5–5.1)
Sodium: 135 mmol/L (ref 135–145)

## 2024-02-10 LAB — MAGNESIUM: Magnesium: 1.9 mg/dL (ref 1.7–2.4)

## 2024-02-10 LAB — PHOSPHORUS: Phosphorus: 2 mg/dL — ABNORMAL LOW (ref 2.5–4.6)

## 2024-02-10 NOTE — Progress Notes (Addendum)
 " PROGRESS NOTE    RAHM MINIX  FMW:[id number] DOB: 1935/07/31 DOA: 01/29/2024 PCP: System, Provider Not In   Brief Narrative:  89 year old male with history of type 2 diabetes, coronary artery disease status post CABG, interstitial lung disease with chronic hypoxic respiratory failure on 5 L of oxygen at baseline, chronic systolic heart failure with reduced ejection fraction with last known ejection fraction of 25 to 30% and severe peripheral artery disease with right foot gangrene who has recently completed antibiotics for right foot infection 3 weeks back presented to the hospital with worsening pain and redness of the right foot. Patient was found to have ischemic foot. He wad evaluated by vascular surgeon and underwent right AKA by Dr. Silver. Post op he was admitted to ICU for Obs. Out ICU on 02/01/24. Patient is doing fine and plan for CIR Assessment & Plan:   Principal Problem:   Foot infection Active Problems:   Gangrene of right foot (HCC)   DM II (diabetes mellitus, type II), controlled (HCC)   HFrEF (heart failure with reduced ejection fraction) (HCC)   On mechanically assisted ventilation (HCC)   Acute respiratory failure with hypoxia (HCC)   ILD (interstitial lung disease) (HCC)   Acute on chronic heart failure with preserved ejection fraction (HFpEF) (HCC)   Acute on chronic respiratory failure with hypoxia (HCC)   Typical atrial flutter (HCC)  Right Foot gangrene , status post Right AKA: Severe PAD: Patient is status post right AKA on 01/30/2024. Right AKA  healing well. Off antibiotic   Follow-up in 4 weeks for staple removal. Vascular signed off. Continue PT  Await CIR    Acute on chronic hypoxic respiratory failure due to IPF: Noted he follows up at [facility] pulmonary clinic. At baseline he is on 5 L of supplemental oxygen. Continue pulmonary toiletry  Continue to wean as tolerated.   continue increased dose of prednisone . Continue bronchodilators, Brovana ,  Yupelri . Cellcept has been hold since admission due to foot infection It can be resumed outpatient    Acute on chronic HFrEF:  Continue metoprolol  and losartan . Spironolactone  on hold  due to hyperkalemia.  Echocardiogram this admission showed LVEF 20 to 25%, G2 DD RV function severely reduced. He appears euvolemic now.  Diuretic is on hold due to episode of hypotension  Noted he will needs outpatient ischemic evaluation if functional at baseline. Cardiology signed off. >  Would not be discharged on diuretics.   CAD status post CABG: Continue aspirin , Lipitor, gemfibrozil , metoprolol .   Continue  losartan  12.5 mg daily.   GERD: Continue pantoprazole .   DM II: Continue Lantus  10 units daily Continue sliding scale.   Anemia of chronic disease: No active bleeding Hemoglobin 10.5 and stable   Leukocytosis: Felt to be secondary to prednisone . No sign of infection    New onset atrial flutter: Continue  metoprolol  to 12.5 mg daily  Continue Eliquis . Plan is to continue anticoagulation uninterrupted for 3 weeks and doing a cardioversion outpatient.   Hypophosphatemia: Corrected    DVT prophylaxis: Eliquis  Code Status: Full code Family Communication: No family at bed side. Disposition Plan:     Status is: Inpatient Appropriate to remain inpatient because: Admitted for ischemic and gangrenous right foot , underwent right AKA.  Patient developed new onset A-fib,  now heart rate controlled.  Started on anticoagulation. Awaiting CIR.    Consultants:  Cardiology  Procedures: Echo Antimicrobials:  Anti-infectives (From admission, onward)    Start     Dose/Rate  Route Frequency Ordered Stop   02/02/24 0900  sulfamethoxazole -trimethoprim  (BACTRIM  DS) 800-160 MG per tablet 1 tablet        1 tablet Oral Once per day on Monday Wednesday Friday 02/01/24 0832     01/30/24 1000  vancomycin  (VANCOREADY) IVPB 1500 mg/300 mL  Status:  Discontinued        1,500 mg 150 mL/hr over  120 Minutes Intravenous Every 24 hours 01/29/24 2001 02/02/24 0736   01/29/24 2100  ceFEPIme  (MAXIPIME ) 2 g in sodium chloride  0.9 % 100 mL IVPB  Status:  Discontinued        2 g 200 mL/hr over 30 Minutes Intravenous Every 8 hours 01/29/24 2001 02/02/24 0736      Subjective: Seen today. No Overnight event. Denies sob, no chest pain, no headache, no abdominal pain, no nausea  Objective: Vitals:   02/10/24 0601 02/10/24 0811 02/10/24 0848 02/10/24 1151  BP:  (!) 113/57  110/68  Pulse: 72 87 78 74  Resp: 20 20 (!) 23 20  Temp:  97.8 F (36.6 C)  97.7 F (36.5 C)  TempSrc:  Oral  Oral  SpO2: (!) 86% 99% 98% 95%  Weight: 73.9 kg     Height:        Intake/Output Summary (Last 24 hours) at 02/10/2024 1407 Last data filed at 02/10/2024 1023 Gross per 24 hour  Intake 100 ml  Output 1750 ml  Net -1650 ml   Filed Weights   02/08/24 0500 02/09/24 0349 02/10/24 0601  Weight: 77.1 kg 75.3 kg 73.9 kg    Examination:  HEENT: head atraumatic Neck supple Moist mucous membrane Resp: +rhonchi/rales No wheezes, no crackles CVS: s1/s2+ No JVD ABD: soft, +BS No tender. No palpable mass EXT: Right AKA Left leg > no edema  Neuro: A&0x3 No focal neurology deficit  Psych: Moods appropriate   Data Reviewed: I have personally reviewed following labs and imaging studies  CBC: Recent Labs  Lab 02/04/24 0237 02/05/24 0304 02/06/24 0312 02/10/24 0328  WBC 13.3* 14.0* 20.2* 12.1*  HGB 10.5* 11.0* 11.8* 10.5*  HCT 35.0* 35.9* 38.9* 35.9*  MCV 84.3 82.7 84.2 84.9  PLT 395 407* 444* 367   Basic Metabolic Panel: Recent Labs  Lab 02/04/24 0855 02/05/24 0304 02/06/24 0312 02/07/24 1019 02/10/24 0328  NA 131* 134* 134* 135 135  K 5.3* 4.2 4.1 4.4 4.7  CL 97* 92* 89* 91* 97*  CO2 25 32 37* 36* 30  GLUCOSE 148* 112* 88 153* 101*  BUN 32* 37* 35* 37* 24*  CREATININE 0.63 0.73 0.92 0.70 0.61  CALCIUM  9.8 9.4 9.6 9.3 9.1  MG  --   --   --  2.1 1.9  PHOS 3.1  --   --   --  2.0*    GFR: Estimated Creatinine Clearance: 64.1 mL/min (by C-G formula based on SCr of 0.61 mg/dL). Liver Function Tests: Recent Labs  Lab 02/04/24 0855  AST 20  ALT 23  ALKPHOS 106  BILITOT 0.3  PROT 6.1*  ALBUMIN  3.4*   No results for input(s): LIPASE, AMYLASE in the last 168 hours. No results for input(s): AMMONIA in the last 168 hours. Coagulation Profile: No results for input(s): INR, PROTIME in the last 168 hours.  Cardiac Enzymes: No results for input(s): CKTOTAL, CKMB, CKMBINDEX, TROPONINI in the last 168 hours. BNP (last 3 results) No results for input(s): PROBNP in the last 8760 hours. HbA1C: No results for input(s): HGBA1C in the last 72 hours. CBG: Recent Labs  Lab 02/09/24 1159 02/09/24 1634 02/09/24 2135 02/10/24 0600 02/10/24 1153  GLUCAP 189* 181* 149* 97 153*   Lipid Profile: No results for input(s): CHOL, HDL, LDLCALC, TRIG, CHOLHDL, LDLDIRECT in the last 72 hours. Thyroid Function Tests: No results for input(s): TSH, T4TOTAL, FREET4, T3FREE, THYROIDAB in the last 72 hours. Anemia Panel: No results for input(s): VITAMINB12, FOLATE, FERRITIN, TIBC, IRON, RETICCTPCT in the last 72 hours. Sepsis Labs: No results for input(s): PROCALCITON, LATICACIDVEN in the last 168 hours.  No results found for this or any previous visit (from the past 240 hours).   Radiology Studies: No results found.  Scheduled Meds:  apixaban   5 mg Oral BID   arformoterol   15 mcg Nebulization BID   ascorbic acid   500 mg Oral Q1400   atorvastatin   40 mg Oral Q lunch   cholecalciferol   2,000 Units Oral Q1400   fenofibrate   54 mg Oral Daily   gabapentin   300 mg Oral BID   insulin  aspart  0-5 Units Subcutaneous QHS   insulin  aspart  0-9 Units Subcutaneous TID WC   insulin  aspart  3 Units Subcutaneous TID WC   insulin  glargine  10 Units Subcutaneous Daily   ipratropium  2 spray Each Nare TID   losartan   12.5 mg Oral  Daily   metoprolol  succinate  12.5 mg Oral Daily   multivitamin with minerals  1 tablet Oral Daily   pantoprazole   40 mg Oral Daily   polyethylene glycol  17 g Oral Daily   predniSONE   40 mg Oral Q breakfast   revefenacin   175 mcg Nebulization Daily   senna  1 tablet Oral BID   sodium chloride  flush  3 mL Intravenous Q12H   sulfamethoxazole -trimethoprim   1 tablet Oral Once per day on Monday Wednesday Friday   Continuous Infusions:   LOS: 12 days  Remain inpatient because : Right foot ischemia and infection. S/p Right  AKA   Time spent: 50 mins  Ndofunsu Mickle, MD [facility]   02/10/2024 2:30 PM  For on call review [email or web address]  "

## 2024-02-10 NOTE — Plan of Care (Signed)

## 2024-02-11 LAB — GLUCOSE, CAPILLARY
Glucose-Capillary: 74 mg/dL (ref 70–99)
Glucose-Capillary: 114 mg/dL — ABNORMAL HIGH (ref 70–99)
Glucose-Capillary: 152 mg/dL — ABNORMAL HIGH (ref 70–99)
Glucose-Capillary: 109 mg/dL — ABNORMAL HIGH (ref 70–99)
Glucose-Capillary: 170 mg/dL — ABNORMAL HIGH (ref 70–99)

## 2024-02-11 LAB — CBC
HCT: 38.3 % — ABNORMAL LOW (ref 39.0–52.0)
Hemoglobin: 11.4 g/dL — ABNORMAL LOW (ref 13.0–17.0)
MCH: 25.1 pg — ABNORMAL LOW (ref 26.0–34.0)
MCHC: 29.8 g/dL — ABNORMAL LOW (ref 30.0–36.0)
MCV: 84.2 fL (ref 80.0–100.0)
Platelets: 413 10*3/uL — ABNORMAL HIGH (ref 150–400)
RBC: 4.55 MIL/uL (ref 4.22–5.81)
RDW: 17.3 % — ABNORMAL HIGH (ref 11.5–15.5)
WBC: 13.2 10*3/uL — ABNORMAL HIGH (ref 4.0–10.5)
nRBC: 0 % (ref 0.0–0.2)

## 2024-02-11 NOTE — Care Management Important Message (Signed)
 Important Message  Patient Details  Name: DAUNE COLGATE MRN: [id number] Date of Birth: 1935/02/13   Important Message Given:  Yes - Medicare IM  Printed to unit and bedside RN to give to pt   Charlann Rayfield Hurst, RN 02/11/2024, 10:07 AM

## 2024-02-11 NOTE — Plan of Care (Signed)

## 2024-02-11 NOTE — Progress Notes (Signed)
 " PROGRESS NOTE    Reginald [name]  FMW:[id number] DOB: 08/30/1935 DOA: 01/29/2024 PCP: System, Provider Not In   Brief Narrative:  89 year old male with history of type 2 diabetes, coronary artery disease status post CABG, interstitial lung disease with chronic hypoxic respiratory failure on 5 L of oxygen at baseline, chronic systolic heart failure with reduced ejection fraction with last known ejection fraction of 25 to 30% and severe peripheral artery disease with right foot gangrene who has recently completed antibiotics for right foot infection 3 weeks back presented to the hospital with worsening pain and redness of the right foot. Patient was found to have ischemic foot. He wad evaluated by vascular surgeon and underwent right AKA by Dr. Silver. Post op he was admitted to ICU for Obs. Out ICU on 02/01/24. He did develop new a.fib and he is on eliquis .  Patient is doing fine and plan for CIR Assessment & Plan:   Principal Problem:   Foot infection Active Problems:   Gangrene of right foot (HCC)   DM II (diabetes mellitus, type II), controlled (HCC)   HFrEF (heart failure with reduced ejection fraction) (HCC)   On mechanically assisted ventilation (HCC)   Acute respiratory failure with hypoxia (HCC)   ILD (interstitial lung disease) (HCC)   Acute on chronic heart failure with preserved ejection fraction (HFpEF) (HCC)   Acute on chronic respiratory failure with hypoxia (HCC)   Typical atrial flutter (HCC)  Right Foot gangrene , status post Right AKA: Severe PAD: Patient is status post right AKA on 01/30/2024. Right AKA  healing well. Off antibiotic   Follow-up in 4 weeks for staple removal. Vascular signed off. Continue PT  Await CIR    Acute on chronic hypoxic respiratory failure due to IPF: Noted he follows up at [facility] pulmonary clinic. At baseline he is on 5 L of supplemental oxygen. Continue pulmonary toiletry  Continue to wean as tolerated.   continue increased dose of  prednisone . Continue bronchodilators, Brovana , Yupelri . Cellcept has been hold since admission due to foot infection It can be resumed outpatient    Acute on chronic HFrEF:  Continue metoprolol  and losartan . Spironolactone  on hold  due to hyperkalemia.  Echocardiogram this admission showed LVEF 20 to 25%, G2 DD RV function severely reduced. He appears euvolemic now.  Diuretic is on hold due to episode of hypotension  Noted he will needs outpatient ischemic evaluation if functional at baseline. Cardiology signed off. >  Would not be discharged on diuretics.   CAD status post CABG: Continue aspirin , Lipitor, gemfibrozil , metoprolol .   Continue  losartan  12.5 mg daily.   GERD: Continue pantoprazole .   DM II: Continue Lantus  10 units daily Continue sliding scale.   Anemia of chronic disease: No active bleeding Hemoglobin 10.5 and stable   Leukocytosis: Felt to be secondary to prednisone . No sign of infection    New onset atrial flutter: Continue  metoprolol  12.5 mg daily  Continue Eliquis . Plan is to continue anticoagulation uninterrupted for 3 weeks and doing a cardioversion outpatient.   Hypophosphatemia: Corrected    DVT prophylaxis: Eliquis  Code Status: Full code Family Communication:  No family member at bedside  Disposition Plan:  CIR     Status is: Inpatient Appropriate to remain inpatient because: Admitted for ischemic and gangrenous right foot , underwent right AKA.  Patient developed new onset A-fib,  now heart rate controlled.  Started on anticoagulation. Awaiting CIR.    Consultants:  Cardiology  Procedures: Echo Antimicrobials:  Anti-infectives (From admission, onward)    Start     Dose/Rate Route Frequency Ordered Stop   02/02/24 0900  sulfamethoxazole -trimethoprim  (BACTRIM  DS) 800-160 MG per tablet 1 tablet        1 tablet Oral Once per day on Monday Wednesday Friday 02/01/24 0832     01/30/24 1000  vancomycin  (VANCOREADY) IVPB 1500 mg/300 mL   Status:  Discontinued        1,500 mg 150 mL/hr over 120 Minutes Intravenous Every 24 hours 01/29/24 2001 02/02/24 0736   01/29/24 2100  ceFEPIme  (MAXIPIME ) 2 g in sodium chloride  0.9 % 100 mL IVPB  Status:  Discontinued        2 g 200 mL/hr over 30 Minutes Intravenous Every 8 hours 01/29/24 2001 02/02/24 0736      Subjective: Seen today. He did sleep well.  Denies sob, no chest pain, no headache, no abdominal pain, no nausea  Objective: Vitals:   02/11/24 0831 02/11/24 0932 02/11/24 0937 02/11/24 1150  BP: 115/64   110/85  Pulse: 68   92  Resp: 18   20  Temp: 98.3 F (36.8 C)   (!) 97.5 F (36.4 C)  TempSrc: Oral   Oral  SpO2: 100% 93% 98% 90%  Weight:      Height:        Intake/Output Summary (Last 24 hours) at 02/11/2024 1343 Last data filed at 02/11/2024 0610 Gross per 24 hour  Intake --  Output 700 ml  Net -700 ml   Filed Weights   02/09/24 0349 02/10/24 0601 02/11/24 0342  Weight: 75.3 kg 73.9 kg 74.4 kg    Examination:  HEENT: head atraumatic Neck supple Moist mucous membrane Resp: +rhonchi/rales No wheezes, no crackles CVS: s1/s2+ No JVD ABD: soft, +BS No tender. No palpable mass EXT: Right AKA Left leg > no edema  Neuro: A&0x3 No focal neurology deficit  Psych: Moods appropriate   Data Reviewed: I have personally reviewed following labs and imaging studies  CBC: Recent Labs  Lab 02/05/24 0304 02/06/24 0312 02/10/24 0328 02/11/24 0818  WBC 14.0* 20.2* 12.1* 13.2*  HGB 11.0* 11.8* 10.5* 11.4*  HCT 35.9* 38.9* 35.9* 38.3*  MCV 82.7 84.2 84.9 84.2  PLT 407* 444* 367 413*   Basic Metabolic Panel: Recent Labs  Lab 02/05/24 0304 02/06/24 0312 02/07/24 1019 02/10/24 0328  NA 134* 134* 135 135  K 4.2 4.1 4.4 4.7  CL 92* 89* 91* 97*  CO2 32 37* 36* 30  GLUCOSE 112* 88 153* 101*  BUN 37* 35* 37* 24*  CREATININE 0.73 0.92 0.70 0.61  CALCIUM  9.4 9.6 9.3 9.1  MG  --   --  2.1 1.9  PHOS  --   --   --  2.0*   GFR: Estimated Creatinine  Clearance: 64.1 mL/min (by C-G formula based on SCr of 0.61 mg/dL). Liver Function Tests: No results for input(s): AST, ALT, ALKPHOS, BILITOT, PROT, ALBUMIN  in the last 168 hours.  No results for input(s): LIPASE, AMYLASE in the last 168 hours. No results for input(s): AMMONIA in the last 168 hours. Coagulation Profile: No results for input(s): INR, PROTIME in the last 168 hours.  Cardiac Enzymes: No results for input(s): CKTOTAL, CKMB, CKMBINDEX, TROPONINI in the last 168 hours. BNP (last 3 results) No results for input(s): PROBNP in the last 8760 hours. HbA1C: No results for input(s): HGBA1C in the last 72 hours. CBG: Recent Labs  Lab 02/10/24 1730 02/10/24 2115 02/11/24 9390 02/11/24 0832 02/11/24 1158  GLUCAP 181* 163* 74 114* 152*   Lipid Profile: No results for input(s): CHOL, HDL, LDLCALC, TRIG, CHOLHDL, LDLDIRECT in the last 72 hours. Thyroid Function Tests: No results for input(s): TSH, T4TOTAL, FREET4, T3FREE, THYROIDAB in the last 72 hours. Anemia Panel: No results for input(s): VITAMINB12, FOLATE, FERRITIN, TIBC, IRON, RETICCTPCT in the last 72 hours. Sepsis Labs: No results for input(s): PROCALCITON, LATICACIDVEN in the last 168 hours.  No results found for this or any previous visit (from the past 240 hours).   Radiology Studies: No results found.  Scheduled Meds:  apixaban   5 mg Oral BID   arformoterol   15 mcg Nebulization BID   ascorbic acid   500 mg Oral Q1400   atorvastatin   40 mg Oral Q lunch   cholecalciferol   2,000 Units Oral Q1400   fenofibrate   54 mg Oral Daily   gabapentin   300 mg Oral BID   insulin  aspart  0-5 Units Subcutaneous QHS   insulin  aspart  0-9 Units Subcutaneous TID WC   insulin  aspart  3 Units Subcutaneous TID WC   insulin  glargine  10 Units Subcutaneous Daily   ipratropium  2 spray Each Nare TID   losartan   12.5 mg Oral Daily   metoprolol  succinate  12.5 mg  Oral Daily   multivitamin with minerals  1 tablet Oral Daily   pantoprazole   40 mg Oral Daily   polyethylene glycol  17 g Oral Daily   predniSONE   40 mg Oral Q breakfast   revefenacin   175 mcg Nebulization Daily   senna  1 tablet Oral BID   sodium chloride  flush  3 mL Intravenous Q12H   sulfamethoxazole -trimethoprim   1 tablet Oral Once per day on Monday Wednesday Friday   Continuous Infusions:   LOS: 13 days  Remain inpatient because : Right foot ischemia and infection. S/p Right  AKA . Developed new a.fib and he is on anticoagulation. Rate controlled with beta blocker.  Time spent: 35 minutes   Jefferson Marinas, MD [facility]   02/11/2024 1:43 PM  For on call review [email or web address]  "

## 2024-02-12 LAB — GLUCOSE, CAPILLARY
Glucose-Capillary: 107 mg/dL — ABNORMAL HIGH (ref 70–99)
Glucose-Capillary: 94 mg/dL (ref 70–99)
Glucose-Capillary: 260 mg/dL — ABNORMAL HIGH (ref 70–99)
Glucose-Capillary: 136 mg/dL — ABNORMAL HIGH (ref 70–99)

## 2024-02-12 NOTE — Progress Notes (Signed)
 Occupational Therapy Treatment Patient Details Name: Reginald [name] MRN: [id number] DOB: 10/23/1935 Today's Date: 02/12/2024   History of present illness Pt is a 89 y.o. male who presented to [facility] ED on 1/12 due to R foot pain. 1/13 s/p R AKA PMH: DM2, CAD s/p CABG, chronic hypoxic respiratory failure on 5L o2, HF with reduced EF 25-30%.   OT comments  Patient received in supine and eager to participate with OT. Patient demonstrating good gains with OT treatment with min assist to get to EOB and CGA to return to supine. Patient wanted to address standing with [facility] and required max assist to pull up into standing x3 with patient unable to come to complete stand.  Patient able to perform lateral scoots to left towards [facility] with mod assist.  Patient will benefit from intensive inpatient follow-up therapy, >3 hours/day.  Acute OT to continue to follow to address established goals to facilitate DC to next venue of care.        If plan is discharge home, recommend the following:  Two people to help with walking and/or transfers;A lot of help with bathing/dressing/bathroom;Assistance with cooking/housework;Direct supervision/assist for medications management;Assist for transportation;Help with stairs or ramp for entrance   Equipment Recommendations  Other (comment) (defer to next venue of care)    Recommendations for Other Services      Precautions / Restrictions Precautions Precautions: Fall Recall of Precautions/Restrictions: Intact Precaution/Restrictions Comments: watch HR and O2 Restrictions Weight Bearing Restrictions Per Provider Order: Yes RLE Weight Bearing Per Provider Order: Non weight bearing       Mobility Bed Mobility Overal bed mobility: Needs Assistance Bed Mobility: Sit to Supine, Supine to Sit     Supine to sit: Min assist Sit to supine: Contact guard assist   General bed mobility comments: assistance with scooting towards jEOB    Transfers Overall transfer level:  Needs assistance Equipment used: Ambulation equipment used Transfers: Sit to/from Stand, Bed to chair/wheelchair/BSC Sit to Stand: Max assist          Lateral/Scoot Transfers: Mod assist General transfer comment: 3 standing attempts performed into stedy with max assist and patient unable to come to complete stand, lateral scoot towards [facility] with mod assist and use of bed pads Transfer via Lift Equipment: Stedy   Balance Overall balance assessment: Needs assistance Sitting-balance support: Feet supported, Single extremity supported Sitting balance-Leahy Scale: Poor Sitting balance - Comments: CGA for safety Postural control: Posterior lean Standing balance support: Bilateral upper extremity supported, Reliant on assistive device for balance, During functional activity Standing balance-Leahy Scale: Zero Standing balance comment: not able to fully stand in stedy                           ADL either performed or assessed with clinical judgement   ADL Overall ADL's : Needs assistance/impaired     Grooming: Set up;Sitting                                      Extremity/Trunk Assessment              Vision       Perception     Praxis     Communication Communication Communication: Impaired Factors Affecting Communication: Hearing impaired   Cognition Arousal: Alert Behavior During Therapy: WFL for tasks assessed/performed Cognition: No apparent impairments  Following commands: Impaired Following commands impaired: Follows one step commands inconsistently, Follows multi-step commands inconsistently      Cueing   Cueing Techniques: Verbal cues, Tactile cues, Visual cues  Exercises      Shoulder Instructions       General Comments 5 liters O2 with SpO2 100%    Pertinent Vitals/ Pain       Pain Assessment Pain Assessment: Faces Faces Pain Scale: Hurts a little bit Pain Location: R residual  limb Pain Descriptors / Indicators: Grimacing, Guarding, Operative site guarding Pain Intervention(s): Limited activity within patient's tolerance, Monitored during session, Repositioned  Home Living                                          Prior Functioning/Environment              Frequency  Min 2X/week        Progress Toward Goals  OT Goals(current goals can now be found in the care plan section)  Progress towards OT goals: Progressing toward goals  Acute Rehab OT Goals Patient Stated Goal: to get better OT Goal Formulation: With patient Time For Goal Achievement: 02/14/24 Potential to Achieve Goals: Good  Plan      Co-evaluation                 AM-PAC OT 6 Clicks Daily Activity     Outcome Measure   Help from another person eating meals?: A Little Help from another person taking care of personal grooming?: A Little Help from another person toileting, which includes using toliet, bedpan, or urinal?: A Lot Help from another person bathing (including washing, rinsing, drying)?: A Lot Help from another person to put on and taking off regular upper body clothing?: A Little Help from another person to put on and taking off regular lower body clothing?: A Lot 6 Click Score: 15    End of Session Equipment Utilized During Treatment: Oxygen;Other (comment) (5 liters stedy)  OT Visit Diagnosis: Muscle weakness (generalized) (M62.81);Other (comment) (decreased activity tolerance)   Activity Tolerance Patient tolerated treatment well   Patient Left in bed;with call bell/phone within reach;with bed alarm set   Nurse Communication Mobility status;Precautions;Need for lift equipment        Time: 1143-1208 OT Time Calculation (min): 25 min  Charges: OT General Charges $OT Visit: 1 Visit OT Treatments $Therapeutic Activity: 23-37 mins  Dick [name], OTA Acute Rehabilitation Services  Office [phone]   Reginald [name] 02/12/2024,  1:46 PM

## 2024-02-12 NOTE — Progress Notes (Signed)
 " PROGRESS NOTE    Reginald [name]  FMW:[id number] DOB: 03-07-1935 DOA: 01/29/2024 PCP: System, Provider Not In   Brief Narrative:  89 year old male with history of type 2 diabetes, coronary artery disease status post CABG, interstitial lung disease with chronic hypoxic respiratory failure on 5 L of oxygen at baseline, chronic systolic heart failure with reduced ejection fraction with last known ejection fraction of 25 to 30% and severe peripheral artery disease with right foot gangrene who has recently completed antibiotics for right foot infection 3 weeks back presented to the hospital with worsening pain and redness of the right foot. Patient was found to have ischemic foot. He wad evaluated by vascular surgeon and underwent right AKA by Dr. Silver. Post op he was admitted to ICU for Obs. Out ICU on 02/01/24. He did develop new a.fib and he is on eliquis .  Patient is doing fine and plan for CIR. He is working with physical and motivated  Assessment & Plan:   Principal Problem:   Foot infection Active Problems:   Gangrene of right foot (HCC)   DM II (diabetes mellitus, type II), controlled (HCC)   HFrEF (heart failure with reduced ejection fraction) (HCC)   On mechanically assisted ventilation (HCC)   Acute respiratory failure with hypoxia (HCC)   ILD (interstitial lung disease) (HCC)   Acute on chronic heart failure with preserved ejection fraction (HFpEF) (HCC)   Acute on chronic respiratory failure with hypoxia (HCC)   Typical atrial flutter (HCC)  Right Foot gangrene , status post Right AKA: Severe PAD: Patient is status post right AKA on 01/30/2024. Right AKA  healing well. Off antibiotic   Follow-up in 4 weeks for staple removal. Vascular signed off. Await CIR   Patient continue to work with PT and he is motivated to go to inpatient rehab  Acute on chronic hypoxic respiratory failure due to IPF: Noted he follows up at [facility] pulmonary clinic. At baseline he is on 5 L of supplemental  oxygen. Continue pulmonary toiletry  Continue to wean as tolerated.   continue increased dose of prednisone . Continue bronchodilators, Brovana , Yupelri . Cellcept has been hold since admission due to foot infection It can be resumed outpatient Continue Bactrim  Ds    Acute on chronic HFrEF:  Continue metoprolol  and losartan . Spironolactone  on hold  due to hyperkalemia.  Echocardiogram this admission showed LVEF 20 to 25%, G2 DD RV function severely reduced. He appears euvolemic now.  Diuretic is on hold due to episode of hypotension  Noted he will needs outpatient ischemic evaluation if functional at baseline. Cardiology signed off. >  Would not be discharged on diuretics.   CAD status post CABG: Continue aspirin , Lipitor, gemfibrozil , metoprolol .   Continue  losartan  12.5 mg daily.   GERD: Continue pantoprazole .   DM II: Continue Lantus  10 units daily Continue sliding scale.   Anemia of chronic disease: No active bleeding Hemoglobin 10.5 and stable   Leukocytosis: Felt to be secondary to prednisone . No sign of infection    New onset atrial flutter: Continue  metoprolol  12.5 mg daily  Continue Eliquis . Plan is to continue anticoagulation uninterrupted for 3 weeks and doing a cardioversion outpatient.   Hypophosphatemia: Corrected    DVT prophylaxis: Eliquis  Code Status: Full code Family Communication:  No family member at bedside  Disposition Plan:  CIR     Status is: Inpatient Appropriate to remain inpatient because: Admitted for ischemic and gangrenous right foot , underwent right AKA.  Patient developed new  onset A-fib,  now heart rate controlled.  Started on anticoagulation. Awaiting CIR.    Consultants:  Cardiology  Procedures: Echo Antimicrobials:  Anti-infectives (From admission, onward)    Start     Dose/Rate Route Frequency Ordered Stop   02/02/24 0900  sulfamethoxazole -trimethoprim  (BACTRIM  DS) 800-160 MG per tablet 1 tablet        1 tablet  Oral Once per day on Monday Wednesday Friday 02/01/24 0832     01/30/24 1000  vancomycin  (VANCOREADY) IVPB 1500 mg/300 mL  Status:  Discontinued        1,500 mg 150 mL/hr over 120 Minutes Intravenous Every 24 hours 01/29/24 2001 02/02/24 0736   01/29/24 2100  ceFEPIme  (MAXIPIME ) 2 g in sodium chloride  0.9 % 100 mL IVPB  Status:  Discontinued        2 g 200 mL/hr over 30 Minutes Intravenous Every 8 hours 01/29/24 2001 02/02/24 0736      Subjective: Seen today. He did sleep well.  Denies sob, no chest pain, no headache, no abdominal pain, no nausea  Objective: Vitals:   02/12/24 1012 02/12/24 1016 02/12/24 1124 02/12/24 1200  BP:   96/65   Pulse:   74 100  Resp:   (!) 21 20  Temp:   98.2 F (36.8 C)   TempSrc:   Oral   SpO2: 100% 100% 95% 95%  Weight:      Height:        Intake/Output Summary (Last 24 hours) at 02/12/2024 1438 Last data filed at 02/12/2024 1125 Gross per 24 hour  Intake --  Output 750 ml  Net -750 ml   Filed Weights   02/10/24 0601 02/11/24 0342 02/12/24 0500  Weight: 73.9 kg 74.4 kg 72.6 kg    Examination:  HEENT: head atraumatic Neck supple Moist mucous membrane Resp: +rhonchi/rales No wheezes, no crackles CVS: s1/s2+ No JVD ABD: soft, +BS No tender. No palpable mass EXT: Right AKA Left leg > no edema  Neuro: A&0x3 No focal neurology deficit  Psych: Moods appropriate   Data Reviewed: I have personally reviewed following labs and imaging studies  CBC: Recent Labs  Lab 02/06/24 0312 02/10/24 0328 02/11/24 0818  WBC 20.2* 12.1* 13.2*  HGB 11.8* 10.5* 11.4*  HCT 38.9* 35.9* 38.3*  MCV 84.2 84.9 84.2  PLT 444* 367 413*   Basic Metabolic Panel: Recent Labs  Lab 02/06/24 0312 02/07/24 1019 02/10/24 0328  NA 134* 135 135  K 4.1 4.4 4.7  CL 89* 91* 97*  CO2 37* 36* 30  GLUCOSE 88 153* 101*  BUN 35* 37* 24*  CREATININE 0.92 0.70 0.61  CALCIUM  9.6 9.3 9.1  MG  --  2.1 1.9  PHOS  --   --  2.0*   GFR: Estimated Creatinine  Clearance: 64.1 mL/min (by C-G formula based on SCr of 0.61 mg/dL). Liver Function Tests: No results for input(s): AST, ALT, ALKPHOS, BILITOT, PROT, ALBUMIN  in the last 168 hours.  No results for input(s): LIPASE, AMYLASE in the last 168 hours. No results for input(s): AMMONIA in the last 168 hours. Coagulation Profile: No results for input(s): INR, PROTIME in the last 168 hours.  Cardiac Enzymes: No results for input(s): CKTOTAL, CKMB, CKMBINDEX, TROPONINI in the last 168 hours. BNP (last 3 results) No results for input(s): PROBNP in the last 8760 hours. HbA1C: No results for input(s): HGBA1C in the last 72 hours. CBG: Recent Labs  Lab 02/11/24 1158 02/11/24 1552 02/11/24 2154 02/12/24 0622 02/12/24 1123  GLUCAP 152*  109* 170* 107* 94   Lipid Profile: No results for input(s): CHOL, HDL, LDLCALC, TRIG, CHOLHDL, LDLDIRECT in the last 72 hours. Thyroid Function Tests: No results for input(s): TSH, T4TOTAL, FREET4, T3FREE, THYROIDAB in the last 72 hours. Anemia Panel: No results for input(s): VITAMINB12, FOLATE, FERRITIN, TIBC, IRON, RETICCTPCT in the last 72 hours. Sepsis Labs: No results for input(s): PROCALCITON, LATICACIDVEN in the last 168 hours.  No results found for this or any previous visit (from the past 240 hours).   Radiology Studies: No results found.  Scheduled Meds:  apixaban   5 mg Oral BID   arformoterol   15 mcg Nebulization BID   ascorbic acid   500 mg Oral Q1400   atorvastatin   40 mg Oral Q lunch   cholecalciferol   2,000 Units Oral Q1400   fenofibrate   54 mg Oral Daily   gabapentin   300 mg Oral BID   insulin  aspart  0-5 Units Subcutaneous QHS   insulin  aspart  0-9 Units Subcutaneous TID WC   insulin  aspart  3 Units Subcutaneous TID WC   insulin  glargine  10 Units Subcutaneous Daily   ipratropium  2 spray Each Nare TID   losartan   12.5 mg Oral Daily   metoprolol  succinate  12.5 mg  Oral Daily   multivitamin with minerals  1 tablet Oral Daily   pantoprazole   40 mg Oral Daily   polyethylene glycol  17 g Oral Daily   predniSONE   40 mg Oral Q breakfast   revefenacin   175 mcg Nebulization Daily   senna  1 tablet Oral BID   sodium chloride  flush  3 mL Intravenous Q12H   sulfamethoxazole -trimethoprim   1 tablet Oral Once per day on Monday Wednesday Friday   Continuous Infusions:   LOS: 14 days  Remain inpatient because : Right foot ischemia and infection. S/p Right  AKA . Developed new a.fib and he is on anticoagulation. Rate controlled with beta blocker.  Time spent: 35 minutes   Jefferson Marinas, MD [facility]   02/12/2024 2:38 PM  For on call review [email or web address].  "

## 2024-02-12 NOTE — Progress Notes (Signed)
 Inpatient Rehab Admissions Coordinator:    CIR following. I do not have a bed for him today but am hopeful that we will be able to get him in in the next 1-2 days.   Leita Kleine, MS, CCC-SLP Rehab Admissions Coordinator  [phone] (celll) [phone] (office)

## 2024-02-13 LAB — GLUCOSE, CAPILLARY
Glucose-Capillary: 68 mg/dL — ABNORMAL LOW (ref 70–99)
Glucose-Capillary: 114 mg/dL — ABNORMAL HIGH (ref 70–99)
Glucose-Capillary: 109 mg/dL — ABNORMAL HIGH (ref 70–99)
Glucose-Capillary: 218 mg/dL — ABNORMAL HIGH (ref 70–99)
Glucose-Capillary: 164 mg/dL — ABNORMAL HIGH (ref 70–99)

## 2024-02-13 NOTE — Progress Notes (Signed)
 Physical Therapy Treatment Patient Details Name: Reginald [name] MRN: [id number] DOB: May 12, 1935 Today's Date: 02/13/2024   History of Present Illness Pt is a 89 y.o. male who presented to [facility] ED on 1/12 due to R foot pain. 1/13 s/p R AKA PMH: DM2, CAD s/p CABG, chronic hypoxic respiratory failure on 5L o2, HF with reduced EF 25-30%.    PT Comments  Pt received in bed with wife present, pt motivated to move and to get out of bed. Reviewed amputee exercises and positioning with pt and wife. Pt rolled well for removal of bed pan and was able to come to EOB with increased time and vc's but no physical assist. Worked on sit>stand in stedy for strengthening and pregait training. Pt has difficulty extending L knee and hips to achieve full standing. Max A +2 needed to stand and maintain standing in 5-10 sec bouts. Pt transferred to recliner in stedy and remained in chair. Patient will benefit from intensive inpatient follow-up therapy, >3 hours/day. PT will continue to follow.     If plan is discharge home, recommend the following: A lot of help with bathing/dressing/bathroom;Assistance with cooking/housework;Direct supervision/assist for medications management;Direct supervision/assist for financial management;Assist for transportation;Help with stairs or ramp for entrance;Two people to help with walking and/or transfers   Can travel by private Theme Park Manager (measurements PT);Wheelchair cushion (measurements PT);Hospital bed;Hoyer lift;BSC/3in1    Recommendations for Other Services Rehab consult     Precautions / Restrictions Precautions Precautions: Fall Recall of Precautions/Restrictions: Intact Precaution/Restrictions Comments: watch HR and O2 Restrictions Weight Bearing Restrictions Per Provider Order: Yes RLE Weight Bearing Per Provider Order: Non weight bearing     Mobility  Bed Mobility Overal bed mobility: Needs Assistance Bed Mobility: Supine to  Sit     Supine to sit: Contact guard     General bed mobility comments: pt needed increased time to come to EOB and increased effort needed for wt shift from R elbow up to sitting but was able to complete without physical assist    Transfers Overall transfer level: Needs assistance Equipment used: Ambulation equipment used Transfers: Sit to/from Stand, Bed to chair/wheelchair/BSC Sit to Stand: Max assist, +2 physical assistance           General transfer comment: pt practiced sit>stand from bed and from flaps of stedy. Pt with strong R lean and difficulty fully extending L knee and trunk in standing. Cues for lifting head and chest but pt had difficulty sequencing this. Transfer via Lift Equipment: Stedy  Ambulation/Gait               General Gait Details: unable at this time   Comptroller Bed    Modified Rankin (Stroke Patients Only)       Balance Overall balance assessment: Needs assistance Sitting-balance support: Feet supported, Single extremity supported Sitting balance-Leahy Scale: Fair Sitting balance - Comments: supervision Postural control: Right lateral lean Standing balance support: Bilateral upper extremity supported, Reliant on assistive device for balance, During functional activity Standing balance-Leahy Scale: Zero Standing balance comment: worked on maintaining standing and extending through hips. Achieved only 10 secs at a time today                            Communication Communication Communication: Impaired Factors Affecting Communication:  Hearing impaired  Cognition Arousal: Alert Behavior During Therapy: WFL for tasks assessed/performed   PT - Cognitive impairments: Memory, Attention, Initiation, Sequencing, Safety/Judgement                       PT - Cognition Comments: Pt needs extra time to process information.  Has difficulty with sequencing and problem solving  to fully stand when using the stedy. Pt needs verbal and tactile cues for transfers. Following commands: Impaired Following commands impaired: Follows one step commands inconsistently, Follows multi-step commands inconsistently    Cueing Cueing Techniques: Verbal cues, Tactile cues, Visual cues  Exercises Amputee Exercises Gluteal Sets: AROM, Right, 10 reps, Supine, Seated Hip Extension: AROM, Right, 10 reps, Supine Hip ABduction/ADduction: AROM, Right, 5 reps, Seated    General Comments General comments (skin integrity, edema, etc.): VSS, wife present      Pertinent Vitals/Pain Pain Assessment Pain Assessment: No/denies pain    Home Living                          Prior Function            PT Goals (current goals can now be found in the care plan section) Acute Rehab PT Goals Patient Stated Goal: to go home PT Goal Formulation: With patient/family Time For Goal Achievement: 02/14/24 Potential to Achieve Goals: Good Progress towards PT goals: Progressing toward goals    Frequency    Min 2X/week      PT Plan      Co-evaluation              AM-PAC PT 6 Clicks Mobility   Outcome Measure  Help needed turning from your back to your side while in a flat bed without using bedrails?: A Lot Help needed moving from lying on your back to sitting on the side of a flat bed without using bedrails?: A Lot Help needed moving to and from a bed to a chair (including a wheelchair)?: Total Help needed standing up from a chair using your arms (e.g., wheelchair or bedside chair)?: Total Help needed to walk in hospital room?: Total Help needed climbing 3-5 steps with a railing? : Total 6 Click Score: 8    End of Session Equipment Utilized During Treatment: Gait belt;Oxygen Activity Tolerance: Patient limited by fatigue;Patient tolerated treatment well Patient left: in chair;with call bell/phone within reach;with chair alarm set;with family/visitor present Nurse  Communication: Mobility status PT Visit Diagnosis: Unsteadiness on feet (R26.81);Muscle weakness (generalized) (M62.81);Difficulty in walking, not elsewhere classified (R26.2)     Time: 8840-8772 PT Time Calculation (min) (ACUTE ONLY): 28 min  Charges:    $Therapeutic Activity: 23-37 mins PT General Charges $$ ACUTE PT VISIT: 1 Visit                     Richerd Lipoma, PT  Acute Rehab Services Secure chat preferred Office [phone]    Richerd CROME Raha Tennison 02/13/2024, 1:59 PM

## 2024-02-13 NOTE — Progress Notes (Signed)
 Inpatient Rehab Admissions Coordinator:    CIR following, I do not have a bed today but am hopeful to fit him in in the next 1-2 days.  Leita Kleine, MS, CCC-SLP Rehab Admissions Coordinator  [phone] (celll) [phone] (office)

## 2024-02-13 NOTE — Progress Notes (Signed)
" °  Progress Note    02/13/2024 7:00 AM 14 Days Post-Op  Subjective:  on phone with family.  Denies any pain.  Afebrile  Vitals:   02/13/24 0426 02/13/24 0559  BP: 128/88   Pulse: 80 72  Resp: 20 18  Temp: (!) 97.5 F (36.4 C)   SpO2: 99% 92%    Physical Exam: Incisions:  clean and dry with posterior ecchymosis.  Incision healing nicely.    CBC    Component Value Date/Time   WBC 13.2 (H) 02/11/2024 0818   RBC 4.55 02/11/2024 0818   HGB 11.4 (L) 02/11/2024 0818   HCT 38.3 (L) 02/11/2024 0818   PLT 413 (H) 02/11/2024 0818   MCV 84.2 02/11/2024 0818   MCH 25.1 (L) 02/11/2024 0818   MCHC 29.8 (L) 02/11/2024 0818   RDW 17.3 (H) 02/11/2024 0818    BMET    Component Value Date/Time   NA 135 02/10/2024 0328   K 4.7 02/10/2024 0328   CL 97 (L) 02/10/2024 0328   CO2 30 02/10/2024 0328   GLUCOSE 101 (H) 02/10/2024 0328   BUN 24 (H) 02/10/2024 0328   CREATININE 0.61 02/10/2024 0328   CALCIUM  9.1 02/10/2024 0328   GFRNONAA >60 02/10/2024 0328   GFRAA >60 08/30/2016 1346    INR    Component Value Date/Time   INR 1.3 (H) 01/30/2024 0340     Intake/Output Summary (Last 24 hours) at 02/13/2024 0700 Last data filed at 02/13/2024 9389 Gross per 24 hour  Intake 120 ml  Output 2350 ml  Net -2230 ml     Assessment/Plan:  89 y.o. male is s/p right above knee amputation   14 Days Post-Op   -right AKA stump is viable.   -he has an appt with [facility] on 2/12 for staple removal.   -plan for CIR-please call with questions.   Lucie Apt, PA-C [facility] [phone] 02/13/2024 7:00 AM           "

## 2024-02-13 NOTE — Progress Notes (Signed)
 Patient blood sugar 68 this morning. Patient asymptomatic . 4 ounces of orange juice given per protocol. Blood sugar rechecked 114. Dr. Mickle notified. New orders received will continue plan of care.

## 2024-02-13 NOTE — Progress Notes (Signed)
 " PROGRESS NOTE    Reginald [name]  FMW:[id number] DOB: Nov 29, 1935 DOA: 01/29/2024 PCP: System, Provider Not In   Brief Narrative:  89 year old male with history of type 2 diabetes, coronary artery disease status post CABG, interstitial lung disease with chronic hypoxic respiratory failure on 5 L of oxygen at baseline, chronic systolic heart failure with reduced ejection fraction with last known ejection fraction of 25 to 30% and severe peripheral artery disease with right foot gangrene who has recently completed antibiotics for right foot infection 3 weeks back presented to the hospital with worsening pain and redness of the right foot. Patient was found to have ischemic foot. He wad evaluated by vascular surgeon and underwent right AKA by Dr. Silver. Post op he was admitted to ICU for Obs. Out ICU on 02/01/24. He did develop new a.fib and he is on eliquis .  Patient is doing fine and plan for CIR. Bed not available yet. He is working with physical and motivated  Assessment & Plan:   Principal Problem:   Foot infection Active Problems:   Gangrene of right foot (HCC)   DM II (diabetes mellitus, type II), controlled (HCC)   HFrEF (heart failure with reduced ejection fraction) (HCC)   On mechanically assisted ventilation (HCC)   Acute respiratory failure with hypoxia (HCC)   ILD (interstitial lung disease) (HCC)   Acute on chronic heart failure with preserved ejection fraction (HFpEF) (HCC)   Acute on chronic respiratory failure with hypoxia (HCC)   Typical atrial flutter (HCC)  Right Foot gangrene , status post Right AKA: Severe PAD: Patient is status post right AKA on 01/30/2024. Right AKA  healing well. Off antibiotic   Follow-up in 4 weeks for staple removal. Vascular signed off. CIR is following. When bed is available , he can be discharged.  Patient continue to work with PT and he is motivated to go to inpatient rehab  Acute on chronic hypoxic respiratory failure due to IPF: Noted he  follows up at [facility] pulmonary clinic. At baseline he is on 5 L of supplemental oxygen. Continue pulmonary toiletry  Continue to wean as tolerated.   continue increased dose of prednisone . Continue bronchodilators, Brovana , Yupelri . Cellcept has been hold since admission due to foot infection It can be resumed outpatient Continue Bactrim  Ds    Acute on chronic HFrEF:  Continue metoprolol  and losartan . Spironolactone  on hold  due to hyperkalemia.  Echocardiogram this admission showed LVEF 20 to 25%, G2 DD RV function severely reduced. He appears euvolemic now.  Diuretic is on hold due to episode of hypotension  Noted he will needs outpatient ischemic evaluation if functional at baseline. Cardiology signed off. >  Would not be discharged on diuretics.   CAD status post CABG: Continue aspirin , Lipitor, gemfibrozil , metoprolol .   Continue  losartan  12.5 mg daily.   GERD: Continue pantoprazole .   DM II: Continue Lantus  10 units daily Continue sliding scale.   Anemia of chronic disease: No active bleeding Hemoglobin 10.5 and stable   Leukocytosis: Felt to be secondary to prednisone . No sign of infection    New onset atrial flutter: Continue  metoprolol  12.5 mg daily  Continue Eliquis . Plan is to continue anticoagulation uninterrupted for 3 weeks and doing a cardioversion outpatient.   Hypophosphatemia: Corrected    DVT prophylaxis: Eliquis  Code Status: Full code Family Communication:  No family member at bedside  Disposition Plan:  CIR     Status is: Inpatient Appropriate to remain inpatient because: Admitted for  ischemic and gangrenous right foot , underwent right AKA.  Patient developed new onset A-fib,  now heart rate controlled.  Started on anticoagulation. CIR is following and waiting for bed     Consultants:  Cardiology  Procedures: Echo Antimicrobials:  Anti-infectives (From admission, onward)    Start     Dose/Rate Route Frequency Ordered Stop    02/02/24 0900  sulfamethoxazole -trimethoprim  (BACTRIM  DS) 800-160 MG per tablet 1 tablet        1 tablet Oral Once per day on Monday Wednesday Friday 02/01/24 0832     01/30/24 1000  vancomycin  (VANCOREADY) IVPB 1500 mg/300 mL  Status:  Discontinued        1,500 mg 150 mL/hr over 120 Minutes Intravenous Every 24 hours 01/29/24 2001 02/02/24 0736   01/29/24 2100  ceFEPIme  (MAXIPIME ) 2 g in sodium chloride  0.9 % 100 mL IVPB  Status:  Discontinued        2 g 200 mL/hr over 30 Minutes Intravenous Every 8 hours 01/29/24 2001 02/02/24 0736      Subjective: Seen today> no overnight event. He is sleeping good. Eating and drinking. He is just waiting for bed to become available to go inpatient rehab. Denies sob, no chest pain,no headache, no dizziness  Objective: Vitals:   02/13/24 0815 02/13/24 0841 02/13/24 1125 02/13/24 1210  BP: 123/89 123/89 109/64 115/78  Pulse: 92 92 80 89  Resp: 20  (!) 23 15  Temp: 98.3 F (36.8 C)  98.4 F (36.9 C) 98.5 F (36.9 C)  TempSrc: Oral  Oral Oral  SpO2: 99%  98% 95%  Weight:      Height:        Intake/Output Summary (Last 24 hours) at 02/13/2024 1348 Last data filed at 02/13/2024 1318 Gross per 24 hour  Intake 837 ml  Output 2550 ml  Net -1713 ml   Filed Weights   02/12/24 0500 02/13/24 0426 02/13/24 0559  Weight: 72.6 kg 74.8 kg 75.3 kg    Examination:  HEENT: head atraumatic Neck supple Moist mucous membrane Resp: +rhonchi/rales No wheezes, no crackles CVS: s1/s2+ No JVD ABD: soft, +BS No tender. No palpable mass EXT: Right AKA Left leg > no edema  Neuro: A&0x3 No focal neurology deficit  Psych: Moods appropriate   Data Reviewed: I have personally reviewed following labs and imaging studies  CBC: Recent Labs  Lab 02/10/24 0328 02/11/24 0818  WBC 12.1* 13.2*  HGB 10.5* 11.4*  HCT 35.9* 38.3*  MCV 84.9 84.2  PLT 367 413*   Basic Metabolic Panel: Recent Labs  Lab 02/07/24 1019 02/10/24 0328  NA 135 135  K 4.4 4.7   CL 91* 97*  CO2 36* 30  GLUCOSE 153* 101*  BUN 37* 24*  CREATININE 0.70 0.61  CALCIUM  9.3 9.1  MG 2.1 1.9  PHOS  --  2.0*   GFR: Estimated Creatinine Clearance: 64.1 mL/min (by C-G formula based on SCr of 0.61 mg/dL). Liver Function Tests: No results for input(s): AST, ALT, ALKPHOS, BILITOT, PROT, ALBUMIN  in the last 168 hours.  No results for input(s): LIPASE, AMYLASE in the last 168 hours. No results for input(s): AMMONIA in the last 168 hours. Coagulation Profile: No results for input(s): INR, PROTIME in the last 168 hours.  Cardiac Enzymes: No results for input(s): CKTOTAL, CKMB, CKMBINDEX, TROPONINI in the last 168 hours. BNP (last 3 results) No results for input(s): PROBNP in the last 8760 hours. HbA1C: No results for input(s): HGBA1C in the last 72 hours. CBG:  Recent Labs  Lab 02/12/24 1712 02/12/24 2049 02/13/24 0601 02/13/24 0649 02/13/24 1132  GLUCAP 260* 136* 68* 114* 109*   Lipid Profile: No results for input(s): CHOL, HDL, LDLCALC, TRIG, CHOLHDL, LDLDIRECT in the last 72 hours. Thyroid Function Tests: No results for input(s): TSH, T4TOTAL, FREET4, T3FREE, THYROIDAB in the last 72 hours. Anemia Panel: No results for input(s): VITAMINB12, FOLATE, FERRITIN, TIBC, IRON, RETICCTPCT in the last 72 hours. Sepsis Labs: No results for input(s): PROCALCITON, LATICACIDVEN in the last 168 hours.  No results found for this or any previous visit (from the past 240 hours).   Radiology Studies: No results found.  Scheduled Meds:  apixaban   5 mg Oral BID   arformoterol   15 mcg Nebulization BID   ascorbic acid   500 mg Oral Q1400   atorvastatin   40 mg Oral Q lunch   cholecalciferol   2,000 Units Oral Q1400   fenofibrate   54 mg Oral Daily   gabapentin   300 mg Oral BID   insulin  aspart  0-5 Units Subcutaneous QHS   insulin  aspart  0-9 Units Subcutaneous TID WC   ipratropium  2 spray Each Nare  TID   losartan   12.5 mg Oral Daily   metoprolol  succinate  12.5 mg Oral Daily   multivitamin with minerals  1 tablet Oral Daily   pantoprazole   40 mg Oral Daily   polyethylene glycol  17 g Oral Daily   predniSONE   40 mg Oral Q breakfast   revefenacin   175 mcg Nebulization Daily   senna  1 tablet Oral BID   sodium chloride  flush  3 mL Intravenous Q12H   sulfamethoxazole -trimethoprim   1 tablet Oral Once per day on Monday Wednesday Friday   Continuous Infusions:   LOS: 15 days  Remain inpatient because : Right foot ischemia and infection. S/p Right  AKA . Developed new a.fib and he is on anticoagulation. Rate controlled with beta blocker. CIR is following. Patient is waiting for bed to become available to be discharged   Time spent: 35 minutes   Ndofunsu Mickle, MD [facility]   02/13/2024 1:48 PM  For on call review [email or web address].  "

## 2024-02-14 ENCOUNTER — Other Ambulatory Visit: Payer: Self-pay

## 2024-02-14 ENCOUNTER — Encounter (HOSPITAL_COMMUNITY): Payer: Self-pay | Admitting: Physical Medicine and Rehabilitation

## 2024-02-14 ENCOUNTER — Inpatient Hospital Stay (HOSPITAL_COMMUNITY)
Admission: AD | Admit: 2024-02-14 | DRG: 560 | Disposition: A | Source: Intra-hospital | Attending: Physical Medicine & Rehabilitation | Admitting: Physical Medicine & Rehabilitation

## 2024-02-14 DIAGNOSIS — D72829 Elevated white blood cell count, unspecified: Secondary | ICD-10-CM

## 2024-02-14 DIAGNOSIS — I25810 Atherosclerosis of coronary artery bypass graft(s) without angina pectoris: Secondary | ICD-10-CM | POA: Diagnosis not present

## 2024-02-14 DIAGNOSIS — J849 Interstitial pulmonary disease, unspecified: Secondary | ICD-10-CM

## 2024-02-14 DIAGNOSIS — Z794 Long term (current) use of insulin: Secondary | ICD-10-CM | POA: Diagnosis not present

## 2024-02-14 DIAGNOSIS — S78111A Complete traumatic amputation at level between right hip and knee, initial encounter: Secondary | ICD-10-CM | POA: Diagnosis not present

## 2024-02-14 DIAGNOSIS — E1142 Type 2 diabetes mellitus with diabetic polyneuropathy: Secondary | ICD-10-CM | POA: Diagnosis not present

## 2024-02-14 DIAGNOSIS — I5022 Chronic systolic (congestive) heart failure: Secondary | ICD-10-CM

## 2024-02-14 DIAGNOSIS — K219 Gastro-esophageal reflux disease without esophagitis: Secondary | ICD-10-CM | POA: Diagnosis not present

## 2024-02-14 DIAGNOSIS — D638 Anemia in other chronic diseases classified elsewhere: Secondary | ICD-10-CM

## 2024-02-14 DIAGNOSIS — I4892 Unspecified atrial flutter: Secondary | ICD-10-CM | POA: Diagnosis present

## 2024-02-14 DIAGNOSIS — R4189 Other symptoms and signs involving cognitive functions and awareness: Secondary | ICD-10-CM

## 2024-02-14 DIAGNOSIS — L089 Local infection of the skin and subcutaneous tissue, unspecified: Secondary | ICD-10-CM | POA: Diagnosis not present

## 2024-02-14 LAB — GLUCOSE, CAPILLARY
Glucose-Capillary: 118 mg/dL — ABNORMAL HIGH (ref 70–99)
Glucose-Capillary: 216 mg/dL — ABNORMAL HIGH (ref 70–99)
Glucose-Capillary: 156 mg/dL — ABNORMAL HIGH (ref 70–99)
Glucose-Capillary: 204 mg/dL — ABNORMAL HIGH (ref 70–99)

## 2024-02-14 MED ORDER — METOPROLOL SUCCINATE ER 25 MG PO TB24
12.5000 mg | ORAL_TABLET | Freq: Every day | ORAL | Status: AC
  Administered 2024-02-15 – 2024-02-23 (×9): 12.5 mg via ORAL
  Filled 2024-02-14 (×9): qty 1

## 2024-02-14 MED ORDER — ATORVASTATIN CALCIUM 40 MG PO TABS
40.0000 mg | ORAL_TABLET | Freq: Every day | ORAL | Status: AC
  Administered 2024-02-15 – 2024-02-23 (×9): 40 mg via ORAL
  Filled 2024-02-14 (×9): qty 1

## 2024-02-14 MED ORDER — LORAZEPAM 0.5 MG PO TABS
0.5000 mg | ORAL_TABLET | Freq: Four times a day (QID) | ORAL | Status: DC | PRN
  Administered 2024-02-18 – 2024-02-22 (×6): 0.5 mg via ORAL
  Filled 2024-02-14 (×6): qty 1

## 2024-02-14 MED ORDER — PREDNISONE 5 MG PO TABS
30.0000 mg | ORAL_TABLET | Freq: Every day | ORAL | Status: DC
  Administered 2024-02-15 – 2024-02-23 (×9): 30 mg via ORAL
  Filled 2024-02-14 (×11): qty 2

## 2024-02-14 MED ORDER — GUAIFENESIN-DM 100-10 MG/5ML PO SYRP: 5.0000 mL | ORAL_SOLUTION | Freq: Four times a day (QID) | ORAL | Status: AC | PRN

## 2024-02-14 MED ORDER — LOSARTAN POTASSIUM 25 MG PO TABS
12.5000 mg | ORAL_TABLET | Freq: Every day | ORAL | Status: DC
  Administered 2024-02-15 – 2024-02-17 (×3): 12.5 mg via ORAL
  Filled 2024-02-14 (×3): qty 1

## 2024-02-14 MED ORDER — POVIDONE-IODINE 10 % EX SOLN
Freq: Two times a day (BID) | CUTANEOUS | Status: AC
  Filled 2024-02-14: qty 118

## 2024-02-14 MED ORDER — ORAL CARE MOUTH RINSE: 15.0000 mL | OROMUCOSAL | Status: AC | PRN

## 2024-02-14 MED ORDER — PROCHLORPERAZINE EDISYLATE 10 MG/2ML IJ SOLN: 5.0000 mg | Freq: Four times a day (QID) | INTRAMUSCULAR | Status: AC | PRN

## 2024-02-14 MED ORDER — TRAMADOL HCL 50 MG PO TABS
50.0000 mg | ORAL_TABLET | Freq: Four times a day (QID) | ORAL | Status: AC | PRN
  Administered 2024-02-15 – 2024-02-20 (×7): 50 mg via ORAL
  Filled 2024-02-14 (×7): qty 1

## 2024-02-14 MED ORDER — PROCHLORPERAZINE 25 MG RE SUPP: 12.5000 mg | Freq: Four times a day (QID) | RECTAL | Status: AC | PRN

## 2024-02-14 MED ORDER — PROCHLORPERAZINE MALEATE 5 MG PO TABS: 5.0000 mg | ORAL_TABLET | Freq: Four times a day (QID) | ORAL | Status: DC | PRN

## 2024-02-14 MED ORDER — PREDNISONE 20 MG PO TABS
30.0000 mg | ORAL_TABLET | Freq: Every day | ORAL | Status: DC
  Administered 2024-02-14: 30 mg via ORAL
  Filled 2024-02-14: qty 1

## 2024-02-14 MED ORDER — SULFAMETHOXAZOLE-TRIMETHOPRIM 800-160 MG PO TABS: 1.0000 | ORAL_TABLET | ORAL | Status: AC

## 2024-02-14 MED ORDER — PREDNISONE 10 MG PO TABS: 10.0000 mg | ORAL_TABLET | Freq: Every day | ORAL | Status: AC

## 2024-02-14 MED ORDER — ARFORMOTEROL TARTRATE 15 MCG/2ML IN NEBU
15.0000 ug | INHALATION_SOLUTION | Freq: Two times a day (BID) | RESPIRATORY_TRACT | Status: AC
  Administered 2024-02-14 – 2024-02-23 (×18): 15 ug via RESPIRATORY_TRACT
  Filled 2024-02-14 (×19): qty 2

## 2024-02-14 MED ORDER — APIXABAN 5 MG PO TABS: 5.0000 mg | ORAL_TABLET | Freq: Two times a day (BID) | ORAL | Status: AC

## 2024-02-14 MED ORDER — POLYETHYLENE GLYCOL 3350 17 G PO PACK: 17.0000 g | PACK | Freq: Every day | ORAL | 0 refills | Status: AC

## 2024-02-14 MED ORDER — JUVEN PO PACK
1.0000 | PACK | Freq: Two times a day (BID) | ORAL | Status: AC
  Administered 2024-02-15 – 2024-02-23 (×17): 1 via ORAL
  Filled 2024-02-14 (×17): qty 1

## 2024-02-14 MED ORDER — IPRATROPIUM BROMIDE 0.06 % NA SOLN
2.0000 | Freq: Three times a day (TID) | NASAL | Status: AC
  Administered 2024-02-15 – 2024-02-23 (×27): 2 via NASAL
  Filled 2024-02-14: qty 15

## 2024-02-14 MED ORDER — INSULIN ASPART 100 UNIT/ML IJ SOLN
0.0000 [IU] | Freq: Every day | INTRAMUSCULAR | Status: AC
  Administered 2024-02-14: 2 [IU] via SUBCUTANEOUS
  Filled 2024-02-14 (×2): qty 2

## 2024-02-14 MED ORDER — SULFAMETHOXAZOLE-TRIMETHOPRIM 800-160 MG PO TABS
1.0000 | ORAL_TABLET | ORAL | Status: AC
  Administered 2024-02-16 – 2024-02-23 (×4): 1 via ORAL
  Filled 2024-02-14 (×4): qty 1

## 2024-02-14 MED ORDER — GABAPENTIN 300 MG PO CAPS
300.0000 mg | ORAL_CAPSULE | Freq: Two times a day (BID) | ORAL | Status: DC
  Administered 2024-02-14 – 2024-02-21 (×14): 300 mg via ORAL
  Filled 2024-02-14 (×14): qty 1

## 2024-02-14 MED ORDER — PREDNISONE 20 MG PO TABS: 30.0000 mg | ORAL_TABLET | Freq: Every day | ORAL | Status: DC

## 2024-02-14 MED ORDER — PREDNISONE 10 MG PO TABS: ORAL_TABLET | ORAL | Status: AC

## 2024-02-14 MED ORDER — FENOFIBRATE 54 MG PO TABS: 54.0000 mg | ORAL_TABLET | Freq: Every day | ORAL | Status: AC

## 2024-02-14 MED ORDER — SENNA 8.6 MG PO TABS
2.0000 | ORAL_TABLET | Freq: Every day | ORAL | Status: AC
  Administered 2024-02-15 – 2024-02-23 (×9): 17.2 mg via ORAL
  Filled 2024-02-14 (×9): qty 2

## 2024-02-14 MED ORDER — FENOFIBRATE 54 MG PO TABS
54.0000 mg | ORAL_TABLET | Freq: Every day | ORAL | Status: AC
  Administered 2024-02-15 – 2024-02-23 (×9): 54 mg via ORAL
  Filled 2024-02-14 (×10): qty 1

## 2024-02-14 MED ORDER — REVEFENACIN 175 MCG/3ML IN SOLN
175.0000 ug | Freq: Every day | RESPIRATORY_TRACT | Status: AC
  Administered 2024-02-15 – 2024-02-23 (×8): 175 ug via RESPIRATORY_TRACT
  Filled 2024-02-14 (×10): qty 3

## 2024-02-14 MED ORDER — FLEET ENEMA RE ENEM: 1.0000 | ENEMA | Freq: Once | RECTAL | Status: AC | PRN

## 2024-02-14 MED ORDER — PANTOPRAZOLE SODIUM 40 MG PO TBEC
40.0000 mg | DELAYED_RELEASE_TABLET | Freq: Every day | ORAL | Status: AC
  Administered 2024-02-15 – 2024-02-23 (×9): 40 mg via ORAL
  Filled 2024-02-14 (×9): qty 1

## 2024-02-14 MED ORDER — ENSURE MAX PROTEIN PO LIQD
11.0000 [oz_av] | Freq: Two times a day (BID) | ORAL | Status: AC
  Administered 2024-02-14 – 2024-02-23 (×18): 11 [oz_av] via ORAL

## 2024-02-14 MED ORDER — POLYETHYLENE GLYCOL 3350 17 G PO PACK
17.0000 g | PACK | Freq: Every day | ORAL | Status: AC
  Administered 2024-02-15 – 2024-02-23 (×9): 17 g via ORAL
  Filled 2024-02-14 (×9): qty 1

## 2024-02-14 MED ORDER — DIPHENHYDRAMINE HCL 25 MG PO CAPS
25.0000 mg | ORAL_CAPSULE | Freq: Four times a day (QID) | ORAL | Status: AC | PRN
  Administered 2024-02-21: 25 mg via ORAL
  Filled 2024-02-14: qty 1

## 2024-02-14 MED ORDER — ACETAMINOPHEN 325 MG PO TABS
325.0000 mg | ORAL_TABLET | ORAL | Status: AC | PRN
  Administered 2024-02-16: 650 mg via ORAL
  Administered 2024-02-21: 325 mg via ORAL
  Administered 2024-02-21: 650 mg via ORAL
  Filled 2024-02-14 (×4): qty 2

## 2024-02-14 MED ORDER — LOSARTAN POTASSIUM 25 MG PO TABS: 12.5000 mg | ORAL_TABLET | Freq: Every day | ORAL | Status: AC

## 2024-02-14 MED ORDER — ADULT MULTIVITAMIN W/MINERALS CH
1.0000 | ORAL_TABLET | Freq: Every day | ORAL | Status: AC
  Administered 2024-02-15 – 2024-02-23 (×9): 1 via ORAL
  Filled 2024-02-14 (×9): qty 1

## 2024-02-14 MED ORDER — VITAMIN C 500 MG PO TABS
500.0000 mg | ORAL_TABLET | Freq: Every day | ORAL | Status: AC
  Administered 2024-02-15 – 2024-02-23 (×9): 500 mg via ORAL
  Filled 2024-02-14 (×9): qty 1

## 2024-02-14 MED ORDER — INSULIN ASPART 100 UNIT/ML IJ SOLN
0.0000 [IU] | Freq: Three times a day (TID) | INTRAMUSCULAR | Status: AC
  Administered 2024-02-14 – 2024-02-15 (×2): 2 [IU] via SUBCUTANEOUS
  Administered 2024-02-15 – 2024-02-16 (×2): 1 [IU] via SUBCUTANEOUS
  Administered 2024-02-16: 2 [IU] via SUBCUTANEOUS
  Administered 2024-02-17: 1 [IU] via SUBCUTANEOUS
  Administered 2024-02-17: 2 [IU] via SUBCUTANEOUS
  Administered 2024-02-18: 3 [IU] via SUBCUTANEOUS
  Administered 2024-02-19 (×3): 1 [IU] via SUBCUTANEOUS
  Administered 2024-02-20 (×2): 2 [IU] via SUBCUTANEOUS
  Administered 2024-02-21: 5 [IU] via SUBCUTANEOUS
  Administered 2024-02-21 (×2): 1 [IU] via SUBCUTANEOUS
  Administered 2024-02-22: 3 [IU] via SUBCUTANEOUS
  Administered 2024-02-22 – 2024-02-23 (×3): 1 [IU] via SUBCUTANEOUS
  Administered 2024-02-23: 3 [IU] via SUBCUTANEOUS
  Administered 2024-02-23: 2 [IU] via SUBCUTANEOUS
  Filled 2024-02-14: qty 2
  Filled 2024-02-14: qty 1
  Filled 2024-02-14 (×2): qty 2
  Filled 2024-02-14: qty 1
  Filled 2024-02-14: qty 2
  Filled 2024-02-14: qty 3
  Filled 2024-02-14: qty 2
  Filled 2024-02-14 (×4): qty 1
  Filled 2024-02-14: qty 3
  Filled 2024-02-14: qty 1
  Filled 2024-02-14: qty 3
  Filled 2024-02-14: qty 5
  Filled 2024-02-14 (×5): qty 1

## 2024-02-14 MED ORDER — APIXABAN 5 MG PO TABS
5.0000 mg | ORAL_TABLET | Freq: Two times a day (BID) | ORAL | Status: AC
  Administered 2024-02-14 – 2024-02-23 (×19): 5 mg via ORAL
  Filled 2024-02-14 (×19): qty 1

## 2024-02-14 MED ORDER — BISACODYL 10 MG RE SUPP
10.0000 mg | Freq: Every day | RECTAL | Status: AC | PRN
  Filled 2024-02-14: qty 1

## 2024-02-14 MED ORDER — VITAMIN D 25 MCG (1000 UNIT) PO TABS
2000.0000 [IU] | ORAL_TABLET | Freq: Every day | ORAL | Status: AC
  Administered 2024-02-15 – 2024-02-23 (×9): 2000 [IU] via ORAL
  Filled 2024-02-14 (×9): qty 2

## 2024-02-14 MED ORDER — MELATONIN 3 MG PO TABS
3.0000 mg | ORAL_TABLET | Freq: Every evening | ORAL | Status: DC | PRN
  Administered 2024-02-15: 3 mg via ORAL
  Filled 2024-02-14: qty 1

## 2024-02-14 MED ORDER — ALUM & MAG HYDROXIDE-SIMETH 200-200-20 MG/5ML PO SUSP: 30.0000 mL | ORAL | Status: AC | PRN

## 2024-02-14 NOTE — H&P (Signed)
 "   Physical Medicine and Rehabilitation Admission H&P    CC: Functional deficits due to R-AKA for limb threatening ischemia/gangrene of toes.   HPI:  Reginald [name]. Reginald [name] is an 89 year old male with history of T2DM, CAD s/p CABG, ICM LVEF -25%, severe PAD, pulmonary HTN- 5 L oxygen dependent, interstitial lung disease on Cellcept,  right foot infection treated with antibiotics but continued to worsen with gangrenous changes and was transferred from OSH to [facility] for management. He was evaluated by Dr. Pearline and found to have severe tissue loss of entire forefoot with wet and dry gangrene with cellulitis up to mid calf. Comfort care v/s AKA discussed and family elected on surgical intervention. He underwent R-AKA on 01/13 and transferred to ICU due to high oxygen needs.  He completed 24 hours antibiotics post op and diuresed for acute on chronic systolic CHF.   Cardiology consulted for input on  A flutter with RVR and recommended rate control as well as Eliquis  for stroke prevention. 2 D echo done revealing Ef 20-25% with severe LVD, severe reduction in RVF with severe enlargement of RV, severe RA. He was started on IV heparin  and transitioned to Eliquis  and ASA d/c. He developed respiratory distress with tachypnea on 01/17 and started on IV lasix  bid to 40 mg qid for volume overload and medications adjusted. Respiratory status improved but did develop hypotension requiring IVF therefore diuretics d/c as now euvolemic. Leucocytosis being monitored and ABLA stable. Posterior ecchymosis right thigh being monitored. Cellcept on hold with recommendations to resume on outpatient basis.  Has altered sensation in left foot due to neuropathy.  Chronic double vision, has had prior workup. Has prism  glasses that help. PT/OT has been working with patient who requires max assist +2 for standing in [place] with strong right lean and difficulty extending left/knee trunk   He was independent and ambulatory prior to recent decline.  Was using a walker for mobility in home and wife assisting with sponge baths. CIR recommended due to overall functional decline.    Review of Systems  Constitutional:  Negative for chills and fever.  HENT:  Positive for hearing loss (Chronic).   Eyes:  Positive for double vision (chronic double vision- glasses help). Negative for blurred vision.  Respiratory:  Positive for shortness of breath (with exertion).   Cardiovascular:  Negative for chest pain.  Gastrointestinal:  Negative for abdominal pain, constipation, diarrhea, nausea and vomiting.  Genitourinary: Negative.   Musculoskeletal:  Positive for joint pain.  Neurological:  Positive for sensory change (chronic sensory changes in L foot) and weakness. Negative for headaches.  Psychiatric/Behavioral:  The patient does not have insomnia.      Past Medical History:  Diagnosis Date   Arthritis    Complication of anesthesia    2014 BP DROPPED S/P BACK SURGERY REC. IV FLUIDS   Diabetes mellitus without complication (HCC)    Disc displacement, lumbar    Full dentures    GERD (gastroesophageal reflux disease)    Hearing deficit    does not wear H/A   History of kidney stones    Hypertension    Kidney stone    Neuromuscular disorder (HCC)    Pneumonia    Umbilical hernia    Wears glasses     Past Surgical History:  Procedure Laterality Date   AMPUTATION Right 01/30/2024   Procedure: Right above the Knee AMPUTATION.;  Surgeon: Pearline Norman RAMAN, MD;  Location: [facility] OR;  Service: Vascular;  Laterality: Right;  BACK SURGERY     CATARACT EXTRACTION W/ INTRAOCULAR LENS  IMPLANT, BILATERAL     COLONOSCOPY W/ BIOPSIES AND POLYPECTOMY     LUMBAR LAMINECTOMY/DECOMPRESSION MICRODISCECTOMY Right 08/30/2016   Procedure: Right lumbar three-four microdiscectomy;  Surgeon: Mavis Purchase, MD;  Location: [facility] OR;  Service: Neurosurgery;  Laterality: Right;   MULTIPLE TOOTH EXTRACTIONS     NOSE SURGERY     deviated septum, and blockage    rottor cuff repair      left     Family History  Problem Relation Age of Onset   Stroke Mother    Heart disease Father    Aneurysm Father    Aplastic anemia Sister    Heart disease Other     Social History:  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 He  reports that he has quit smoking. His smoking use included cigarettes. He has quit using smokeless tobacco.  His smokeless tobacco use included chew. He reports that he does not drink alcohol and does not use drugs.   Allergies: Allergies[1]   Medications Prior to Admission  Medication Sig Dispense Refill   albuterol (VENTOLIN HFA) 108 (90 Base) MCG/ACT inhaler Inhale 1 puff into the lungs every 6 (six) hours as needed for wheezing or shortness of breath.     aspirin  EC 81 MG tablet Take 81 mg by mouth at bedtime.      atorvastatin  (LIPITOR) 40 MG tablet Take 40 mg by mouth daily.     B Complex Vitamins (B COMPLEX 100 PO) Take 1 tablet by mouth daily at 2 PM.     Cholecalciferol  (VITAMIN D3) 2000 units TABS Take 2,000 Units by mouth daily at 2 PM.     empagliflozin (JARDIANCE) 10 MG TABS tablet Take 10 mg by mouth daily.     fexofenadine (ALLEGRA) 180 MG tablet Take 180 mg by mouth daily as needed (for allergies.).      [Paused] furosemide  (LASIX ) 40 MG tablet Take 40 mg by mouth daily.     gabapentin  (NEURONTIN ) 300 MG capsule Take 300 mg by mouth 2 (two) times daily.     gemfibrozil  (LOPID ) 600 MG tablet Take 600 mg by mouth daily.     ipratropium (ATROVENT ) 0.06 % nasal spray Place 2 sprays into both nostrils 3 (three) times daily.     levofloxacin (LEVAQUIN) 500 MG tablet Take 500 mg by mouth daily as needed (for urinary discomfort).     LORazepam  (ATIVAN ) 0.5 MG tablet Take 0.5 mg by mouth every 6 (six) hours as needed for anxiety.     losartan  (COZAAR ) 25 MG tablet Take 25 mg by mouth daily.     metFORMIN  (GLUCOPHAGE ) 1000 MG tablet Take 1,000 mg by mouth See admin instructions. 1000 am 500 pm  3   metoprolol  succinate  (TOPROL -XL) 25 MG 24 hr tablet Take 25 mg by mouth daily.     montelukast  (SINGULAIR ) 10 MG tablet Take 10 mg by mouth daily at 2 PM.      Multiple Vitamin (MULTIVITAMIN WITH MINERALS) TABS tablet Take 1 tablet by mouth daily.     mycophenolate (CELLCEPT) 500 MG tablet Take 500 mg by mouth 2 (two) times daily.     Omega-3 Fatty Acids (FISH OIL) 1000 MG CAPS Take 1,000 mg by mouth every evening.     omeprazole (PRILOSEC) 40 MG capsule Take 40 mg by mouth in the morning and at bedtime.     sertraline (ZOLOFT) 50 MG tablet Take 50 mg by mouth daily.  sodium chloride  (OCEAN) 0.65 % SOLN nasal spray Place 1 spray into both nostrils daily as needed. Saline Mixture patient makes.     umeclidinium-vilanterol (ANORO ELLIPTA ) 62.5-25 MCG/ACT AEPB Inhale 1 puff into the lungs daily.     vitamin C  (ASCORBIC ACID ) 500 MG tablet Take 500 mg by mouth daily at 2 PM.     [DISCONTINUED] predniSONE  (DELTASONE ) 10 MG tablet Take 10 mg by mouth daily with breakfast.     atorvastatin  (LIPITOR) 10 MG tablet Take 10 mg by mouth daily with lunch. (Patient not taking: Reported on 01/29/2024)  6      Home: Home Living Family/patient expects to be discharged to:: Private residence Living Arrangements: Spouse/significant other Available Help at Discharge: Family, Available 24 hours/day Type of Home: House Home Access: Ramped entrance Home Layout: Able to live on main level with bedroom/bathroom Bathroom Shower/Tub: Sponge bathes at baseline (spouse helps) Bathroom Toilet: Handicapped height Bathroom Accessibility: Yes Home Equipment: Agricultural Consultant (2 wheels), Transport chair, Wheelchair - manual, Grab bars - toilet Additional Comments: uses walker in the house  Lives With: Spouse   Functional History: Prior Function Prior Level of Function : Needs assist Mobility Comments: walks with RW in the house, transport chair can pass door frame and uses for MD appts ADLs Comments: sponge bath with wife  helping  Functional Status:  Mobility: Bed Mobility Overal bed mobility: Needs Assistance Bed Mobility: Supine to Sit Rolling: Min assist Supine to sit: Contact guard Sit to supine: Contact guard assist General bed mobility comments: pt needed increased time to come to EOB and increased effort needed for wt shift from R elbow up to sitting but was able to complete without physical assist Transfers Overall transfer level: Needs assistance Equipment used: Ambulation equipment used Transfers: Sit to/from Stand, Bed to chair/wheelchair/BSC Sit to Stand: Max assist, +2 physical assistance Bed to/from chair/wheelchair/BSC transfer type:: Via Lift equipment Squat pivot transfers: Max assist  Lateral/Scoot Transfers: Mod assist Transfer via Lift Equipment: Stedy General transfer comment: pt practiced sit>stand from bed and from flaps of stedy. Pt with strong R lean and difficulty fully extending L knee and trunk in standing. Cues for lifting head and chest but pt had difficulty sequencing this. Ambulation/Gait General Gait Details: unable at this time    ADL: ADL Overall ADL's : Needs assistance/impaired Eating/Feeding: Minimal assistance, Bed level Eating/Feeding Details (indicate cue type and reason): opening packets and cutting food Grooming: Set up, Sitting Grooming Details (indicate cue type and reason): on EOB Upper Body Bathing: Minimal assistance, Sitting Upper Body Bathing Details (indicate cue type and reason): assistance for back Lower Body Bathing: Moderate assistance, Sitting/lateral leans Lower Body Bathing Details (indicate cue type and reason): seated on EOB with lateral leaning and assistance for left foot Upper Body Dressing : Minimal assistance, Sitting Upper Body Dressing Details (indicate cue type and reason): gown change Lower Body Dressing: Total assistance, Bed level Toilet Transfer: Maximal assistance, Squat-pivot, BSC/3in1 Toilet Transfer Details (indicate  cue type and reason): patient unable to come to complete stand and required max assist to stand and pivot Toileting- Clothing Manipulation and Hygiene: Total assistance, +2 for physical assistance, Sit to/from stand Toileting - Clothing Manipulation Details (indicate cue type and reason): assistance of one to stand and assistance of another for hygiene General ADL Comments: performed self care seated on EOB with supervision for sitting balance  Cognition: Cognition Orientation Level: Oriented X4 Cognition Arousal: Alert Behavior During Therapy: WFL for tasks assessed/performed Blood pressure 118/79, pulse 92,  temperature 98 F (36.7 C), temperature source Oral, resp. rate 18, height 5' 8 (1.727 m), weight 73.9 kg, SpO2 92%. Physical Exam  General: No apparent distress. Noted to have drop in saturation to 88% with conversation.  HEENT: Head is normocephalic, atraumatic, sclera anicteric, oral mucosa moist, wearing dentures Neck: Supple without JVD or lymphadenopathy Heart: Reg rate and rhythm.  Chest: Mild rales b/l, no distress, 5 L [place] Abdomen: Soft, non-tender, non-distended, bowel sounds positive. Extremities: R AKA Psych: Pt's affect is appropriate. Pt is cooperative Skin: Ecchymotic area under Right thigh and from blood draw left antecubital site L heel boggy, callus L lateral foot R AKA incision CDI Neuro:    Mental Status: AAOx3 Speech/Languate: Fluent, follows simple commands CRANIAL NERVES: II: PERRL.  III, IV, VI: EOM intact, no gaze preference or deviation V: normal sensation bilaterally VII: no asymmetry VIII: hard of hearing IX, X: normal palatal elevation XI: head turn intact XII: Tongue midline   MOTOR: RUE: 3/5 Deltoid, 5/5 Biceps, 5/5 Triceps,4+/5 Grip LUE: 4/5 Deltoid, 5/5 Biceps, 5/5 Triceps, 4+/5 Grip RLE: HF at least 4/5 LLE: HF 4+/5, KE 5/5, ADF 2/5, APF 2-3/5   SENSORY: Altered in distal RLE to light touch  MSK: pain with R shoulder abduction-  chronic Claw toes L foot     Results for orders placed or performed during the hospital encounter of 01/29/24 (from the past 48 hours)  Glucose, capillary     Status: Abnormal   Collection Time: 02/12/24  5:12 PM  Result Value Ref Range   Glucose-Capillary 260 (H) 70 - 99 mg/dL    Comment: Glucose reference range applies only to samples taken after fasting for at least 8 hours.   Comment 1 Notify RN    Comment 2 Document in Chart   Glucose, capillary     Status: Abnormal   Collection Time: 02/12/24  8:49 PM  Result Value Ref Range   Glucose-Capillary 136 (H) 70 - 99 mg/dL    Comment: Glucose reference range applies only to samples taken after fasting for at least 8 hours.  Glucose, capillary     Status: Abnormal   Collection Time: 02/13/24  6:01 AM  Result Value Ref Range   Glucose-Capillary 68 (L) 70 - 99 mg/dL    Comment: Glucose reference range applies only to samples taken after fasting for at least 8 hours.  Glucose, capillary     Status: Abnormal   Collection Time: 02/13/24  6:49 AM  Result Value Ref Range   Glucose-Capillary 114 (H) 70 - 99 mg/dL    Comment: Glucose reference range applies only to samples taken after fasting for at least 8 hours.  Glucose, capillary     Status: Abnormal   Collection Time: 02/13/24 11:32 AM  Result Value Ref Range   Glucose-Capillary 109 (H) 70 - 99 mg/dL    Comment: Glucose reference range applies only to samples taken after fasting for at least 8 hours.  Glucose, capillary     Status: Abnormal   Collection Time: 02/13/24  4:49 PM  Result Value Ref Range   Glucose-Capillary 218 (H) 70 - 99 mg/dL    Comment: Glucose reference range applies only to samples taken after fasting for at least 8 hours.  Glucose, capillary     Status: Abnormal   Collection Time: 02/13/24  9:14 PM  Result Value Ref Range   Glucose-Capillary 164 (H) 70 - 99 mg/dL    Comment: Glucose reference range applies only to samples taken after  fasting for at least 8  hours.   Comment 1 Notify RN    Comment 2 Document in Chart   Glucose, capillary     Status: Abnormal   Collection Time: 02/14/24  6:16 AM  Result Value Ref Range   Glucose-Capillary 118 (H) 70 - 99 mg/dL    Comment: Glucose reference range applies only to samples taken after fasting for at least 8 hours.   Comment 1 Notify RN    Comment 2 Document in Chart    No results found.    Blood pressure 118/79, pulse 92, temperature 98 F (36.7 C), temperature source Oral, resp. rate 18, height 5' 8 (1.727 m), weight 73.9 kg, SpO2 92%.  Medical Problem List and Plan: 1. 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  Antithrombotics: -DVT/anticoagulation:  Pharmaceutical: Eliquis   -antiplatelet therapy: N/A as now on Eliquis  3. Pain Management: Ultram  prn 4. Mood/Behavior/Sleep: LCSW to follow for evaluation and support.   -antipsychotic agents: N/A 5. Neuropsych/cognition: This patient is capable of making decisions on his own behalf. 6. Skin/Wound Care: Pressure relief measures.  --Will order prevalon boot for  left is boggy. --pain pressure ares on left later foot and toes with betadine .   R- BKA                        Left lateral heel          Left toes.     7. Fluids/Electrolytes/Nutrition: Strict I/O. Continue Vitamin C .   --add protein supplement.  9. R-AKA due to PAD/gangrene/ischemia: Has appt for staple removal 02/12 10.  New onset Afib/A flutter: Monitor HR TID and for symptoms with activity.  --On Toprol  XL and eliquis . Follow up with cards for DCCV after d/c.  11.  T2DM with neuropathy: Monitor BS ac/hs and use SSI for elevated BS  --BS have been labile form 68- 216. Will continue to hold metformin .  12. CAD s/p CABG: On Lipitor, Cozaar , metoprolol  and Eliquis (off ASA) 13. Chronic systolic CHF/ICM w/EF: Monitor  daily weight and for signs of overload.  --Strict I/O. Demadex  d/c due to hypotension. --On Lipitor, Cozaar , Eliquis . Continue to hold jardiance to promote wound healing.  14.  Interstitial lung disease w/chronic hypoxic RF: Recommendations to resume Cellcept after dc. Continue Prednisone  30 mg and Bactrim  DS 3 X /wk  --Dependent of 5 L oxygen per [place] at baseline. Continue  15. Leucocytosis: Monitor for fevers and other signs of infection.   --WBC 15.2 @ admission-->14.0-->20.2-->13.2 16. Pre-renal azotemia: Improving off Demadex .  17. Anemia of chronic disease: Has been treating right foot infection since last June  --Hgb stable at 10.5- 11.4  18. GERD: PPI    Reginald GORMAN Schmitz, PA-C 02/14/2024  I have personally performed a face to face diagnostic evaluation of this patient and formulated the key components of the plan.  Additionally, I have personally reviewed laboratory data, imaging studies, as well as relevant notes and concur with the physician assistant's documentation above.  The patient's status has not changed from the original H&P.  Any changes in documentation from the acute care chart have been noted above.  Murray Collier, MD     [1]  Allergies Allergen Reactions   Naproxen Itching and Rash   Roxicet [Oxycodone-Acetaminophen ] Itching and Rash   "

## 2024-02-14 NOTE — Progress Notes (Signed)
 Patient transferred to 4E-02 via bed, AO x4 at the time of transfer. Patients wife was with him, belongings taken with patient.

## 2024-02-14 NOTE — TOC Transition Note (Signed)
 Transition of Care (TOC) - Discharge Note Rayfield Gobble RN, BSN Inpatient Care Management Unit 4E- RN Case Manager See Treatment Team for direct phone #   Patient Details  Name: Reginald [name] MRN: [id number] Date of Birth: [birth year]  Transition of Care [facility]) CM/SW Contact:  Gobble Rayfield Hurst, RN Phone Number: 02/14/2024, 10:22 AM   Clinical Narrative:    Notified this am by CIR liaison that pt has a bed to admit today to [facility] INPT rehab.   Pt stable for transition, unit staff will transport to INPT rehab once bed has been assigned.   No further ICM needs noted   Final next level of care: IP Rehab Facility Barriers to Discharge: Barriers Resolved   Patient Goals and CMS Choice Patient states their goals for this hospitalization and ongoing recovery are:: return home   Choice offered to / list presented to : Spouse, Patient      Discharge Placement                 [facility] INPT rehab      Discharge Plan and Services Additional resources added to the After Visit Summary for     Discharge Planning Services: CM Consult Post Acute Care Choice: IP Rehab          DME Arranged: N/A DME Agency: NA       HH Arranged: NA HH Agency: NA        Social Drivers of Health (SDOH) Interventions SDOH Screenings   Food Insecurity: No Food Insecurity (01/29/2024)  Housing: Low Risk (01/29/2024)  Transportation Needs: No Transportation Needs (01/29/2024)  Utilities: Not At Risk (01/29/2024)  Financial Resource Strain: Low Risk (01/02/2024)   Received from [facility]  Physical Activity: Insufficiently Active (01/02/2024)   Received from [facility]  Social Connections: Moderately Integrated (01/29/2024)  Stress: No Stress Concern Present (01/02/2024)   Received from [facility]  Tobacco Use: Medium Risk (01/29/2024)  Health Literacy: Low Risk (01/02/2024)   Received from [facility]     Readmission Risk Interventions    02/14/2024   10:22 AM   Readmission Risk Prevention Plan  Transportation Screening Complete  Home Care Screening Complete  Medication Review (RN CM) Complete

## 2024-02-14 NOTE — Plan of Care (Signed)
  Problem: Education: Goal: Knowledge of General Education information will improve Description: Including pain rating scale, medication(s)/side effects and non-pharmacologic comfort measures Outcome: Progressing   Problem: Health Behavior/Discharge Planning: Goal: Ability to manage health-related needs will improve Outcome: Progressing   Problem: Clinical Measurements: Goal: Ability to maintain clinical measurements within normal limits will improve Outcome: Progressing Goal: Will remain free from infection Outcome: Progressing Goal: Diagnostic test results will improve Outcome: Progressing Goal: Respiratory complications will improve Outcome: Progressing Goal: Cardiovascular complication will be avoided Outcome: Progressing   Problem: Activity: Goal: Risk for activity intolerance will decrease Outcome: Progressing   Problem: Nutrition: Goal: Adequate nutrition will be maintained Outcome: Progressing   Problem: Coping: Goal: Level of anxiety will decrease Outcome: Progressing   Problem: Elimination: Goal: Will not experience complications related to bowel motility Outcome: Progressing Goal: Will not experience complications related to urinary retention Outcome: Progressing   Problem: Pain Managment: Goal: General experience of comfort will improve and/or be controlled Outcome: Progressing   Problem: Safety: Goal: Ability to remain free from injury will improve Outcome: Progressing   Problem: Skin Integrity: Goal: Risk for impaired skin integrity will decrease Outcome: Progressing   Problem: Education: Goal: Ability to describe self-care measures that may prevent or decrease complications (Diabetes Survival Skills Education) will improve Outcome: Progressing   Problem: Coping: Goal: Ability to adjust to condition or change in health will improve Outcome: Progressing   Problem: Fluid Volume: Goal: Ability to maintain a balanced intake and output will  improve Outcome: Progressing   Problem: Health Behavior/Discharge Planning: Goal: Ability to identify and utilize available resources and services will improve Outcome: Progressing Goal: Ability to manage health-related needs will improve Outcome: Progressing   Problem: Metabolic: Goal: Ability to maintain appropriate glucose levels will improve Outcome: Progressing   Problem: Nutritional: Goal: Maintenance of adequate nutrition will improve Outcome: Progressing Goal: Progress toward achieving an optimal weight will improve Outcome: Progressing   Problem: Skin Integrity: Goal: Risk for impaired skin integrity will decrease Outcome: Progressing   Problem: Tissue Perfusion: Goal: Adequacy of tissue perfusion will improve Outcome: Progressing   Problem: Activity: Goal: Ability to tolerate increased activity will improve Outcome: Progressing   Problem: Respiratory: Goal: Ability to maintain a clear airway and adequate ventilation will improve Outcome: Progressing   Problem: Role Relationship: Goal: Method of communication will improve Outcome: Progressing

## 2024-02-14 NOTE — H&P (Signed)
 "      Physical Medicine and Rehabilitation Admission H&P     CC: Functional deficits due to R-AKA for limb threatening ischemia/gangrene of toes.    HPI:  Reginald [name]. Seidman is an 89 year old male with history of T2DM, CAD s/p CABG, ICM LVEF -25%, severe PAD, pulmonary HTN- 5 L oxygen dependent, interstitial lung disease on Cellcept,  right foot infection treated with antibiotics but continued to worsen with gangrenous changes and was transferred from OSH to [facility] for management. He was evaluated by Dr. Pearline and found to have severe tissue loss of entire forefoot with wet and dry gangrene with cellulitis up to mid calf. Comfort care v/s AKA discussed and family elected on surgical intervention. He underwent R-AKA on 01/13 and transferred to ICU due to high oxygen needs.  He completed 24 hours antibiotics post op and diuresed for acute on chronic systolic CHF.    Cardiology consulted for input on  A flutter with RVR and recommended rate control as well as Eliquis  for stroke prevention. 2 D echo done revealing Ef 20-25% with severe LVD, severe reduction in RVF with severe enlargement of RV, severe RA. He was started on IV heparin  and transitioned to Eliquis  and ASA d/c. He developed respiratory distress with tachypnea on 01/17 and started on IV lasix  bid to 40 mg qid for volume overload and medications adjusted. Respiratory status improved but did develop hypotension requiring IVF therefore diuretics d/c as now euvolemic. Leucocytosis being monitored and ABLA stable. Posterior ecchymosis right thigh being monitored. Cellcept on hold with recommendations to resume on outpatient basis.  Has altered sensation in left foot due to neuropathy.  Chronic double vision, has had prior workup. Has prism  glasses that help. PT/OT has been working with patient who requires max assist +2 for standing in [place] with strong right lean and difficulty extending left/knee trunk    He was independent and ambulatory prior to recent  decline. Was using a walker for mobility in home and wife assisting with sponge baths. CIR recommended due to overall functional decline.      Review of Systems  Constitutional:  Negative for chills and fever.  HENT:  Positive for hearing loss (Chronic).   Eyes:  Positive for double vision (chronic double vision- glasses help). Negative for blurred vision.  Respiratory:  Positive for shortness of breath (with exertion).   Cardiovascular:  Negative for chest pain.  Gastrointestinal:  Negative for abdominal pain, constipation, diarrhea, nausea and vomiting.  Genitourinary: Negative.   Musculoskeletal:  Positive for joint pain.  Neurological:  Positive for sensory change (chronic sensory changes in L foot) and weakness. Negative for headaches.  Psychiatric/Behavioral:  The patient does not have insomnia.            Past Medical History:  Diagnosis Date   Arthritis     Complication of anesthesia      2014 BP DROPPED S/P BACK SURGERY REC. IV FLUIDS   Diabetes mellitus without complication (HCC)     Disc displacement, lumbar     Full dentures     GERD (gastroesophageal reflux disease)     Hearing deficit      does not wear H/A   History of kidney stones     Hypertension     Kidney stone     Neuromuscular disorder (HCC)     Pneumonia     Umbilical hernia     Wears glasses  Past Surgical History:  Procedure Laterality Date   AMPUTATION Right 01/30/2024    Procedure: Right above the Knee AMPUTATION.;  Surgeon: Pearline Norman RAMAN, MD;  Location: [facility] OR;  Service: Vascular;  Laterality: Right;   BACK SURGERY       CATARACT EXTRACTION W/ INTRAOCULAR LENS  IMPLANT, BILATERAL       COLONOSCOPY W/ BIOPSIES AND POLYPECTOMY       LUMBAR LAMINECTOMY/DECOMPRESSION MICRODISCECTOMY Right 08/30/2016    Procedure: Right lumbar three-four microdiscectomy;  Surgeon: Mavis Purchase, MD;  Location: [facility] OR;  Service: Neurosurgery;  Laterality: Right;   MULTIPLE TOOTH EXTRACTIONS        NOSE SURGERY        deviated septum, and blockage   rottor cuff repair         left                Family History  Problem Relation Age of Onset   Stroke Mother     Heart disease Father     Aneurysm Father     Aplastic anemia Sister     Heart disease Other            Social History:  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 He  reports that he has quit smoking. His smoking use included cigarettes. He has quit using smokeless tobacco.  His smokeless tobacco use included chew. He reports that he does not drink alcohol and does not use drugs.     Allergies: [Allergies]  [Allergies]     Allergen Reactions   Naproxen Itching and Rash   Roxicet [Oxycodone-Acetaminophen ] Itching and Rash             Medications Prior to Admission  Medication Sig Dispense Refill   albuterol (VENTOLIN HFA) 108 (90 Base) MCG/ACT inhaler Inhale 1 puff into the lungs every 6 (six) hours as needed for wheezing or shortness of breath.       aspirin  EC 81 MG tablet Take 81 mg by mouth at bedtime.        atorvastatin  (LIPITOR) 40 MG tablet Take 40 mg by mouth daily.       B Complex Vitamins (B COMPLEX 100 PO) Take 1 tablet by mouth daily at 2 PM.       Cholecalciferol  (VITAMIN D3) 2000 units TABS Take 2,000 Units by mouth daily at 2 PM.       empagliflozin (JARDIANCE) 10 MG TABS tablet Take 10 mg by mouth daily.       fexofenadine (ALLEGRA) 180 MG tablet Take 180 mg by mouth daily as needed (for allergies.).        [Paused] furosemide  (LASIX ) 40 MG tablet Take 40 mg by mouth daily.       gabapentin  (NEURONTIN ) 300 MG capsule Take 300 mg by mouth 2 (two) times daily.       gemfibrozil  (LOPID ) 600 MG tablet Take 600 mg by mouth daily.       ipratropium (ATROVENT ) 0.06 % nasal spray Place 2 sprays into both nostrils 3 (three) times daily.       levofloxacin (LEVAQUIN) 500 MG tablet Take 500 mg by mouth daily as needed (for urinary discomfort).       LORazepam  (ATIVAN ) 0.5 MG tablet Take 0.5 mg by mouth  every 6 (six) hours as needed for anxiety.       losartan  (COZAAR ) 25 MG tablet Take 25 mg by mouth daily.       metFORMIN  (GLUCOPHAGE ) 1000 MG tablet Take  1,000 mg by mouth See admin instructions. 1000 am 500 pm   3   metoprolol  succinate (TOPROL -XL) 25 MG 24 hr tablet Take 25 mg by mouth daily.       montelukast  (SINGULAIR ) 10 MG tablet Take 10 mg by mouth daily at 2 PM.        Multiple Vitamin (MULTIVITAMIN WITH MINERALS) TABS tablet Take 1 tablet by mouth daily.       mycophenolate (CELLCEPT) 500 MG tablet Take 500 mg by mouth 2 (two) times daily.       Omega-3 Fatty Acids (FISH OIL) 1000 MG CAPS Take 1,000 mg by mouth every evening.       omeprazole (PRILOSEC) 40 MG capsule Take 40 mg by mouth in the morning and at bedtime.       sertraline (ZOLOFT) 50 MG tablet Take 50 mg by mouth daily.       sodium chloride  (OCEAN) 0.65 % SOLN nasal spray Place 1 spray into both nostrils daily as needed. Saline Mixture patient makes.       umeclidinium-vilanterol (ANORO ELLIPTA ) 62.5-25 MCG/ACT AEPB Inhale 1 puff into the lungs daily.       vitamin C  (ASCORBIC ACID ) 500 MG tablet Take 500 mg by mouth daily at 2 PM.       [DISCONTINUED] predniSONE  (DELTASONE ) 10 MG tablet Take 10 mg by mouth daily with breakfast.       atorvastatin  (LIPITOR) 10 MG tablet Take 10 mg by mouth daily with lunch. (Patient not taking: Reported on 01/29/2024)   6              Home: Home Living Family/patient expects to be discharged to:: Private residence Living Arrangements: Spouse/significant other Available Help at Discharge: Family, Available 24 hours/day Type of Home: House Home Access: Ramped entrance Home Layout: Able to live on main level with bedroom/bathroom Bathroom Shower/Tub: Sponge bathes at baseline (spouse helps) Bathroom Toilet: Handicapped height Bathroom Accessibility: Yes Home Equipment: Agricultural Consultant (2 wheels), Transport chair, Wheelchair - manual, Grab bars - toilet Additional Comments: uses  walker in the house  Lives With: Spouse   Functional History: Prior Function Prior Level of Function : Needs assist Mobility Comments: walks with RW in the house, transport chair can pass door frame and uses for MD appts ADLs Comments: sponge bath with wife helping   Functional Status:  Mobility: Bed Mobility Overal bed mobility: Needs Assistance Bed Mobility: Supine to Sit Rolling: Min assist Supine to sit: Contact guard Sit to supine: Contact guard assist General bed mobility comments: pt needed increased time to come to EOB and increased effort needed for wt shift from R elbow up to sitting but was able to complete without physical assist Transfers Overall transfer level: Needs assistance Equipment used: Ambulation equipment used Transfers: Sit to/from Stand, Bed to chair/wheelchair/BSC Sit to Stand: Max assist, +2 physical assistance Bed to/from chair/wheelchair/BSC transfer type:: Via Lift equipment Squat pivot transfers: Max assist  Lateral/Scoot Transfers: Mod assist Transfer via Lift Equipment: Stedy General transfer comment: pt practiced sit>stand from bed and from flaps of stedy. Pt with strong R lean and difficulty fully extending L knee and trunk in standing. Cues for lifting head and chest but pt had difficulty sequencing this. Ambulation/Gait General Gait Details: unable at this time   ADL: ADL Overall ADL's : Needs assistance/impaired Eating/Feeding: Minimal assistance, Bed level Eating/Feeding Details (indicate cue type and reason): opening packets and cutting food Grooming: Set up, Sitting Grooming Details (indicate cue type and reason):  on EOB Upper Body Bathing: Minimal assistance, Sitting Upper Body Bathing Details (indicate cue type and reason): assistance for back Lower Body Bathing: Moderate assistance, Sitting/lateral leans Lower Body Bathing Details (indicate cue type and reason): seated on EOB with lateral leaning and assistance for left foot Upper  Body Dressing : Minimal assistance, Sitting Upper Body Dressing Details (indicate cue type and reason): gown change Lower Body Dressing: Total assistance, Bed level Toilet Transfer: Maximal assistance, Squat-pivot, BSC/3in1 Toilet Transfer Details (indicate cue type and reason): patient unable to come to complete stand and required max assist to stand and pivot Toileting- Clothing Manipulation and Hygiene: Total assistance, +2 for physical assistance, Sit to/from stand Toileting - Clothing Manipulation Details (indicate cue type and reason): assistance of one to stand and assistance of another for hygiene General ADL Comments: performed self care seated on EOB with supervision for sitting balance   Cognition: Cognition Orientation Level: Oriented X4 Cognition Arousal: Alert Behavior During Therapy: [facility] for tasks assessed/performed Blood pressure 118/79, pulse 92, temperature 98 F (36.7 C), temperature source Oral, resp. rate 18, height 5' 8 (1.727 m), weight 73.9 kg, SpO2 92%. Physical Exam   General: No apparent distress. Noted to have drop in saturation to 88% with conversation.  HEENT: Head is normocephalic, atraumatic, sclera anicteric, oral mucosa moist, wearing dentures Neck: Supple without JVD or lymphadenopathy Heart: Reg rate and rhythm.  Chest: Mild rales b/l, no distress, 5 L [place] Abdomen: Soft, non-tender, non-distended, bowel sounds positive. Extremities: R AKA Psych: Pt's affect is appropriate. Pt is cooperative Skin: Ecchymotic area under Right thigh and from blood draw left antecubital site L heel boggy, callus L lateral foot R AKA incision CDI Neuro:     Mental Status: AAOx3 Speech/Languate: Fluent, follows simple commands CRANIAL NERVES: II: PERRL.  III, IV, VI: EOM intact, no gaze preference or deviation V: normal sensation bilaterally VII: no asymmetry VIII: hard of hearing IX, X: normal palatal elevation XI: head turn intact XII: Tongue midline      MOTOR: RUE: 3/5 Deltoid, 5/5 Biceps, 5/5 Triceps,4+/5 Grip LUE: 4/5 Deltoid, 5/5 Biceps, 5/5 Triceps, 4+/5 Grip RLE: HF at least 4/5 LLE: HF 4+/5, KE 5/5, ADF 2/5, APF 2-3/5     SENSORY: Altered in distal RLE to light touch  MSK: pain with R shoulder abduction- chronic Claw toes L foot        Lab Results Last 48 Hours        Results for orders placed or performed during the hospital encounter of 01/29/24 (from the past 48 hours)  Glucose, capillary     Status: Abnormal    Collection Time: 02/12/24  5:12 PM  Result Value Ref Range    Glucose-Capillary 260 (H) 70 - 99 mg/dL      Comment: Glucose reference range applies only to samples taken after fasting for at least 8 hours.    Comment 1 Notify RN      Comment 2 Document in Chart    Glucose, capillary     Status: Abnormal    Collection Time: 02/12/24  8:49 PM  Result Value Ref Range    Glucose-Capillary 136 (H) 70 - 99 mg/dL      Comment: Glucose reference range applies only to samples taken after fasting for at least 8 hours.  Glucose, capillary     Status: Abnormal    Collection Time: 02/13/24  6:01 AM  Result Value Ref Range    Glucose-Capillary 68 (L) 70 - 99 mg/dL  Comment: Glucose reference range applies only to samples taken after fasting for at least 8 hours.  Glucose, capillary     Status: Abnormal    Collection Time: 02/13/24  6:49 AM  Result Value Ref Range    Glucose-Capillary 114 (H) 70 - 99 mg/dL      Comment: Glucose reference range applies only to samples taken after fasting for at least 8 hours.  Glucose, capillary     Status: Abnormal    Collection Time: 02/13/24 11:32 AM  Result Value Ref Range    Glucose-Capillary 109 (H) 70 - 99 mg/dL      Comment: Glucose reference range applies only to samples taken after fasting for at least 8 hours.  Glucose, capillary     Status: Abnormal    Collection Time: 02/13/24  4:49 PM  Result Value Ref Range    Glucose-Capillary 218 (H) 70 - 99 mg/dL       Comment: Glucose reference range applies only to samples taken after fasting for at least 8 hours.  Glucose, capillary     Status: Abnormal    Collection Time: 02/13/24  9:14 PM  Result Value Ref Range    Glucose-Capillary 164 (H) 70 - 99 mg/dL      Comment: Glucose reference range applies only to samples taken after fasting for at least 8 hours.    Comment 1 Notify RN      Comment 2 Document in Chart    Glucose, capillary     Status: Abnormal    Collection Time: 02/14/24  6:16 AM  Result Value Ref Range    Glucose-Capillary 118 (H) 70 - 99 mg/dL      Comment: Glucose reference range applies only to samples taken after fasting for at least 8 hours.    Comment 1 Notify RN      Comment 2 Document in Chart        Imaging Results (Last 48 hours)  No results found.         Blood pressure 118/79, pulse 92, temperature 98 F (36.7 C), temperature source Oral, resp. rate 18, height 5' 8 (1.727 m), weight 73.9 kg, SpO2 92%.   Medical Problem List and Plan: 1. 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  Antithrombotics: -DVT/anticoagulation:  Pharmaceutical: Eliquis              -antiplatelet therapy: N/A as now on Eliquis  3. Pain Management: Ultram  prn 4. Mood/Behavior/Sleep: LCSW to follow for evaluation and support.              -antipsychotic agents: N/A 5. Neuropsych/cognition: This patient is capable of making decisions on his own behalf. 6. Skin/Wound Care: Pressure relief measures.  --Will order prevalon boot for  left is boggy. --pain pressure ares on left later foot and toes with betadine .   R- BKA                        Left lateral heel          Left toes.       7. Fluids/Electrolytes/Nutrition: Strict I/O. Continue Vitamin C .              --  add protein supplement.  9. R-AKA due  to PAD/gangrene/ischemia: Has appt for staple removal 02/12 10.  New onset Afib/A flutter: Monitor HR TID and for symptoms with activity.             --On Toprol  XL and eliquis . Follow up with cards for DCCV after d/c.  11.  T2DM with neuropathy: Monitor BS ac/hs and use SSI for elevated BS             --BS have been labile form 68- 216. Will continue to hold metformin .  12. CAD s/p CABG: On Lipitor, Cozaar , metoprolol  and Eliquis (off ASA) 13. Chronic systolic CHF/ICM w/EF: Monitor daily weight and for signs of overload.  --Strict I/O. Demadex  d/c due to hypotension. --On Lipitor, Cozaar , Eliquis . Continue to hold jardiance to promote wound healing.  14.  Interstitial lung disease w/chronic hypoxic RF: Recommendations to resume Cellcept after dc. Continue Prednisone  30 mg and Bactrim  DS 3 X /wk             --Dependent of 5 L oxygen per [place] at baseline. Continue  15. Leucocytosis: Monitor for fevers and other signs of infection.              --WBC 15.2 @ admission-->14.0-->20.2-->13.2 16. Pre-renal azotemia: Improving off Demadex .  17. Anemia of chronic disease: Has been treating right foot infection since last June             --Hgb stable at 10.5- 11.4  18. GERD: PPI       Sharlet GORMAN Schmitz, PA-C 02/14/2024   I have personally performed a face to face diagnostic evaluation of this patient and formulated the key components of the plan.  Additionally, I have personally reviewed laboratory data, imaging studies, as well as relevant notes and concur with the physician assistant's documentation above.   The patient's status has not changed from the original H&P.  Any changes in documentation from the acute care chart have been noted above.   Murray Collier, MD   "

## 2024-02-14 NOTE — Progress Notes (Signed)
 PMR Admission Coordinator Pre-Admission Assessment   Patient: Reginald [name] is an 89 y.o., male MRN: [id number] DOB: 1935-09-19 Height: 5' 8 (172.7 cm) Weight: 75.5 kg   Insurance Information HMO:     PPO:      PCP:      IPA:      80/20: yes     OTHER:  PRIMARY: Medicare Part A and B      Policy#: [id number]      Subscriber: patient CM Name:       Phone#:      Fax#:  Pre-Cert#:       Employer:  Benefits:  Phone #: verified eligibility on [employer] on 02/04/24     Name:  Eff. Date: Part A and B effective 02/17/02     Deduct: $1,736      Out of Pocket Max: NA      Life Max: NA CIR: 100% coverage      SNF: 100% coverage for days 1-20, 80% coverage for days 21-100 Outpatient: 80% coverage     Co-Pay: 20% Home Health: 100% coverage      Co-Pay:  DME: 80% coverage     Co-Pay: 20% Providers: pt's choice SECONDARY: BCBS/Federal EMP PPO      Policy#: [id number]     Phone#: [phone]   Financial Counselor:       Phone#:    The Data Collection Information Summary for patients in Inpatient Rehabilitation Facilities with attached Privacy Act Statement-Health Care Records was provided and verbally reviewed with: Patient   Emergency Contact Information Contact Information       Name Relation Home Work Mobile    Farabaugh,Gail Spouse [phone]   [phone]         Other Contacts       Name Relation Home Work Mobile    Mantione,Michael Son [phone]               Current Medical History  Patient Admitting Diagnosis: Right AKA History of Present Illness: Pt is an 89  year old male with medical hx significant for: DM II, CAD s/p CABG, interstitial lung disease with chronic hypoxic respiratory failure on 5 L of oxygen at baseline, chronic systolic heart failure with reduced EF with last known EF of 25-30%, severe peripheral artery disease, right foot gangrene. Pt with recent right foot infection; on antibiotics x3 weeks. Pt originally presented to [facility] d/t worsening pain and redness  of right foot. X-ray negative for acute abnormality. Given IV vancomycin  and Zosyn. Pt transferred to [facility] on 01/29/24 for further treatment. Pt on nonrebreather mask then transitioned to HFNC. Vascular Surgery consulted. Noted pt with critical limb ischemia of right LE with severe tissue loss involving entirety of forefoot. Recommended AKA. Pt underwent right AKA by Dr. Pearline on 01/30/24. Cardiology consulted to assess new onset atrial flutter with intermittent episodes of RVR. Recommend rate control management and Eliquis ; pursue cardioversion after 3 weeks of anticoagulation. Therapy evaluations completed and CIR recommended d/t pt's deficits in functional mobility.   Patient's medical record from [place] has been reviewed by the rehabilitation admission coordinator and physician.   Past Medical History      Past Medical History:  Diagnosis Date   Arthritis     Complication of anesthesia      2014 BP DROPPED S/P BACK SURGERY REC. IV FLUIDS   Diabetes mellitus without complication (HCC)     Disc displacement, lumbar     Full dentures  GERD (gastroesophageal reflux disease)     Hearing deficit      does not wear H/A   History of kidney stones     Hypertension     Kidney stone     Neuromuscular disorder (HCC)     Pneumonia     Umbilical hernia     Wears glasses            Has the patient had major surgery during 100 days prior to admission? Yes   Family History   family history includes Aneurysm in his father; Aplastic anemia in his sister; Heart disease in his father and another family member; Stroke in his mother.   Current Medications [Current Medications]  [Current Medications]   Current Facility-Administered Medications:    acetaminophen  (TYLENOL ) tablet 650 mg, 650 mg, Oral, TID, Babcock, Peter E, NP, 650 mg at 02/04/24 0947   apixaban  (ELIQUIS ) tablet 5 mg, 5 mg, Oral, BID, Reome, Earle J, RPH, 5 mg at 02/04/24 0948   arformoterol   (BROVANA ) nebulizer solution 15 mcg, 15 mcg, Nebulization, BID, Babcock, Peter E, NP, 15 mcg at 02/03/24 2027   ascorbic acid  (VITAMIN C ) tablet 500 mg, 500 mg, Oral, Q1400, Babcock, Peter E, NP, 500 mg at 02/03/24 1416   aspirin  chewable tablet 81 mg, 81 mg, Oral, Daily, Babcock, Peter E, NP, 81 mg at 02/04/24 0946   atorvastatin  (LIPITOR) tablet 40 mg, 40 mg, Oral, Q lunch, Babcock, Peter E, NP, 40 mg at 02/03/24 1224   Chlorhexidine  Gluconate Cloth 2 % PADS 6 each, 6 each, Topical, Daily, Jenna Maude BRAVO, NP, 6 each at 02/04/24 0948   cholecalciferol  (VITAMIN D3) 25 MCG (1000 UNIT) tablet 2,000 Units, 2,000 Units, Oral, Q1400, Babcock, Peter E, NP, 2,000 Units at 02/03/24 1416   fenofibrate  tablet 54 mg, 54 mg, Oral, Daily, Babcock, Peter E, NP, 54 mg at 02/04/24 0947   furosemide  (LASIX ) injection 40 mg, 40 mg, Intravenous, Q6H, Mallipeddi, Vishnu P, MD, 40 mg at 02/04/24 1005   gabapentin  (NEURONTIN ) capsule 300 mg, 300 mg, Oral, BID, Babcock, Peter E, NP, 300 mg at 02/04/24 9052   HYDROmorphone  (DILAUDID ) injection 0.5 mg, 0.5 mg, Intravenous, Q2H PRN, Babcock, Peter E, NP, 0.5 mg at 02/01/24 2139   insulin  aspart (novoLOG ) injection 0-5 Units, 0-5 Units, Subcutaneous, QHS, Khatri, Pardeep, MD   insulin  aspart (novoLOG ) injection 0-9 Units, 0-9 Units, Subcutaneous, TID WC, Khatri, Pardeep, MD, 1 Units at 02/04/24 0558   insulin  aspart (novoLOG ) injection 3 Units, 3 Units, Subcutaneous, TID WC, Khatri, Pardeep, MD, 3 Units at 02/04/24 0744   insulin  glargine (LANTUS ) injection 10 Units, 10 Units, Subcutaneous, Daily, Gretta Leita SQUIBB, DO, 10 Units at 02/04/24 0948   ipratropium (ATROVENT ) 0.06 % nasal spray 2 spray, 2 spray, Each Nare, TID, Jenna Maude BRAVO, NP, 2 spray at 02/04/24 0949   LORazepam  (ATIVAN ) tablet 0.5 mg, 0.5 mg, Oral, Q6H PRN, Babcock, Peter E, NP   losartan  (COZAAR ) tablet 25 mg, 25 mg, Oral, Daily, Adams, Zane, PA-C, 25 mg at 02/04/24 0948   metoprolol  succinate (TOPROL -XL)  24 hr tablet 25 mg, 25 mg, Oral, Daily, Adams, Zane, PA-C, 25 mg at 02/04/24 9052   metoprolol  tartrate (LOPRESSOR ) injection 2.5 mg, 2.5 mg, Intravenous, Q6H PRN, Leotis Bogus, MD   multivitamin with minerals tablet 1 tablet, 1 tablet, Oral, Daily, Babcock, Peter E, NP, 1 tablet at 02/04/24 9052   [DISCONTINUED] ondansetron  (ZOFRAN ) tablet 4 mg, 4 mg, Oral, Q6H PRN **OR** ondansetron  (ZOFRAN ) injection 4 mg, 4  mg, Intravenous, Q6H PRN, Babcock, Peter E, NP, 4 mg at 02/01/24 1522   Oral care mouth rinse, 15 mL, Mouth Rinse, PRN, Jenna Maude BRAVO, NP   pantoprazole  (PROTONIX ) injection 40 mg, 40 mg, Intravenous, Daily, Babcock, Peter E, NP, 40 mg at 02/04/24 0946   polyethylene glycol (MIRALAX  / GLYCOLAX ) packet 17 g, 17 g, Oral, Daily, Jenna Maude BRAVO, NP, 17 g at 02/04/24 9046   predniSONE  (DELTASONE ) tablet 40 mg, 40 mg, Oral, Q breakfast, Gretta Leita SQUIBB, DO, 40 mg at 02/04/24 0744   revefenacin  (YUPELRI ) nebulizer solution 175 mcg, 175 mcg, Nebulization, Daily, Babcock, Peter E, NP, 175 mcg at 02/03/24 9162   senna (SENOKOT) tablet 8.6 mg, 1 tablet, Oral, BID, Jenna Maude BRAVO, NP, 8.6 mg at 02/04/24 9050   senna-docusate (Senokot-S) tablet 1 tablet, 1 tablet, Oral, QHS PRN, Jenna Maude BRAVO, NP, 1 tablet at 02/03/24 2117   sodium chloride  flush (NS) 0.9 % injection 3 mL, 3 mL, Intravenous, Q12H, Jenna Maude BRAVO, NP, 3 mL at 02/04/24 0949   sodium chloride  flush (NS) 0.9 % injection 3 mL, 3 mL, Intravenous, PRN, Babcock, Peter E, NP   sulfamethoxazole -trimethoprim  (BACTRIM  DS) 800-160 MG per tablet 1 tablet, 1 tablet, Oral, Once per day on Monday Wednesday Friday, Gretta Leita SQUIBB, DO, 1 tablet at 02/02/24 9170   traMADol  (ULTRAM ) tablet 50 mg, 50 mg, Oral, Q6H, Babcock, Peter E, NP, 50 mg at 02/04/24 9441   Patients Current Diet:  Diet Order                  Diet Heart Room service appropriate? No; Fluid consistency: Thin  Diet effective now                         Precautions /  Restrictions Precautions Precautions: Fall Precaution/Restrictions Comments: watch HR/O2, 5L baseline Restrictions Weight Bearing Restrictions Per Provider Order: Yes RLE Weight Bearing Per Provider Order: Non weight bearing (sgx AKA)    Has the patient had 2 or more falls or a fall with injury in the past year? Yes   Prior Activity Level Limited Community (1-2x/wk): gets out of house ~2-3 days/week   Prior Functional Level Self Care: Did the patient need help bathing, dressing, using the toilet or eating? Independent   Indoor Mobility: Did the patient need assistance with walking from room to room (with or without device)? Independent   Stairs: Did the patient need assistance with internal or external stairs (with or without device)? Independent   Functional Cognition: Did the patient need help planning regular tasks such as shopping or remembering to take medications? Independent   Patient Information Are you of Hispanic, Latino/a,or Spanish origin?: A. No, not of Hispanic, Latino/a, or Spanish origin What is your race?: A. White Do you need or want an interpreter to communicate with a doctor or health care staff?: 0. No   Patient's Response To:  Health Literacy and Transportation Is the patient able to respond to health literacy and transportation needs?: Yes Health Literacy - How often do you need to have someone help you when you read instructions, pamphlets, or other written material from your doctor or pharmacy?: Never In the past 12 months, has lack of transportation kept you from medical appointments or from getting medications?: No In the past 12 months, has lack of transportation kept you from meetings, work, or from getting things needed for daily living?: No   Journalist, Newspaper / Equipment Home  Equipment: Agricultural Consultant (2 wheels), It sales professional, Wheelchair - manual, Grab bars - toilet   Prior Device Use: Indicate devices/aids used by the patient prior to  current illness, exacerbation or injury? Walker   Current Functional Level Cognition   Orientation Level: Oriented X4    Extremity Assessment (includes Sensation/Coordination)   Upper Extremity Assessment: Generalized weakness  Lower Extremity Assessment: RLE deficits/detail RLE Deficits / Details: new AKA, ACE wrap on stump clean and dry. able to demo good ROM against gravity     ADLs   Overall ADL's : Needs assistance/impaired Eating/Feeding: Minimal assistance, Bed level Eating/Feeding Details (indicate cue type and reason): opening packets and cutting food Grooming: Minimal assistance, Sitting, Wash/dry hands, Wash/dry face Grooming Details (indicate cue type and reason): close guard for sitting balance, set up of two washcloths Upper Body Bathing: Maximal assistance, Bed level Lower Body Bathing: Total assistance, Bed level Upper Body Dressing : Maximal assistance, Bed level Lower Body Dressing: Total assistance, Bed level General ADL Comments: bed level this session due to desaturation     Mobility   Overal bed mobility: Needs Assistance Bed Mobility: Supine to Sit, Sit to Supine Rolling: Min assist Supine to sit: Mod assist, HOB elevated, Used rails Sit to supine: Min assist, HOB elevated General bed mobility comments: able to bring LE's off EOB with ModA to raise trunk. Cueing throughout for technique. MinA for return to supine for BLE management     Transfers   Overall transfer level: Needs assistance Equipment used: None Transfers: Bed to chair/wheelchair/BSC Bed to/from chair/wheelchair/BSC transfer type:: Lateral/scoot transfer  Lateral/Scoot Transfers: Min assist, +2 safety/equipment General transfer comment: simulated along EOB     Ambulation / Gait / Stairs / Clinical Biochemist / Balance Dynamic Sitting Balance Sitting balance - Comments: progressed from min to CGA Balance Overall balance assessment: Needs assistance Sitting-balance  support: Bilateral upper extremity supported, Feet supported Sitting balance-Leahy Scale: Poor Sitting balance - Comments: progressed from min to CGA Postural control: Posterior lean     Special considerations/life events  Oxygen none, Bladder incontinence, External Urinary Catheter, Skin Ecchymosis: arm/lower, bilateral; Erythema/Redness: buttocks/bilateral; Surgical Incision: leg/right, and Diabetic management Novolog  3 units 3x daily with meals; Novolog  0-5 units daily at bedtime; Novolog  0-9 units 3x daily with meals; Lantus  10 units daily    Previous Home Environment (from acute therapy documentation) Living Arrangements: Spouse/significant other  Lives With: Spouse Available Help at Discharge: Family, Available 24 hours/day Type of Home: House Home Layout: Able to live on main level with bedroom/bathroom Home Access: Level entry Bathroom Shower/Tub: Sponge bathes at baseline (spouse helps) Bathroom Toilet: Handicapped height Bathroom Accessibility: Yes How Accessible: Accessible via walker Home Care Services: No Additional Comments: uses walker in the house   Discharge Living Setting Plans for Discharge Living Setting: Patient's home Type of Home at Discharge: House Discharge Home Layout: Able to live on main level with bedroom/bathroom Discharge Home Access: Level entry Discharge Bathroom Shower/Tub: Tub/shower unit (takes sponge baths) Discharge Bathroom Toilet: Handicapped height Discharge Bathroom Accessibility: Yes How Accessible: Accessible via walker Does the patient have any problems obtaining your medications?: No   Social/Family/Support Systems Anticipated Caregiver: Jarriel Papillion, wife Anticipated Caregiver's Contact Information: [phone] Caregiver Availability: 24/7 Discharge Plan Discussed with Primary Caregiver: Yes Is Caregiver In Agreement with Plan?: Yes Does Caregiver/Family have Issues with Lodging/Transportation while Pt is in Rehab?: No    Goals Patient/Family Goal for Rehab: PT/OT Min-mod A Expected length of  stay:18-21 days  Pt/Family Agrees to Admission and willing to participate: Yes Program Orientation Provided & Reviewed with Pt/Caregiver Including Roles  & Responsibilities: Yes   Decrease burden of Care through IP rehab admission: NA   Possible need for SNF placement upon discharge: Not anticipated   Patient Condition: I have reviewed medical records from [facility], spoken with CM, and patient and spouse. I met with patient at the bedside and discussed via phone for inpatient rehabilitation assessment.  Patient will benefit from ongoing PT and OT, can actively participate in 3 hours of therapy a day 5 days of the week, and can make measurable gains during the admission.  Patient will also benefit from the coordinated team approach during an Inpatient Acute Rehabilitation admission.  The patient will receive intensive therapy as well as Rehabilitation physician, nursing, social worker, and care management interventions.  Due to bladder management, safety, skin/wound care, disease management, medication administration, pain management, and patient education the patient requires 24 hour a day rehabilitation nursing.  The patient is currently Max A with mobility and basic ADLs.  Discharge setting and therapy post discharge at home with home health is anticipated.  Patient has agreed to participate in the Acute Inpatient Rehabilitation Program and will admit today.   Preadmission Screen Completed By:  Tinnie SHAUNNA Yvone Delayne, 02/04/2024 10:15 AM with updates by Leita Kleine, MS, CCC-SLP   ______________________________________________________________________   Discussed status with Dr. Urbano on 02/14/24 at 900 and received approval for admission today.   Admission Coordinator:  Tinnie SHAUNNA Yvone Delayne, CCC-SLP, time 1011/Date 02/14/24    Assessment/Plan: Diagnosis: AKA Does the need for close, 24 hr/day Medical  supervision in concert with the patient's rehab needs make it unreasonable for this patient to be served in a less intensive setting? Yes Co-Morbidities requiring supervision/potential complications: CAD, GERD, DM2, anemia of chronic disease, afib, diabetes, leukocytosis Due to bladder management, bowel management, safety, skin/wound care, disease management, medication administration, pain management, and patient education, does the patient require 24 hr/day rehab nursing? Yes Does the patient require coordinated care of a physician, rehab nurse, PT, OT, and SLP to address physical and functional deficits in the context of the above medical diagnosis(es)? Yes Addressing deficits in the following areas: balance, endurance, locomotion, strength, transferring, bowel/bladder control, bathing, dressing, feeding, grooming, toileting, cognition, and psychosocial support Can the patient actively participate in an intensive therapy program of at least 3 hrs of therapy 5 days a week? Yes The potential for patient to make measurable gains while on inpatient rehab is excellent Anticipated functional outcomes upon discharge from inpatient rehab: min assist and mod assist PT, min assist and mod assist OT, n/a SLP Estimated rehab length of stay to reach the above functional goals is: 18-21 Anticipated discharge destination: Home 10. Overall Rehab/Functional Prognosis: good     MD Signature: Murray Urbano           Revision History  Date/Time User Provider Type Action  02/14/2024 10:16 AM Urbano Murray, MD Physician Sign  02/14/2024 10:12 AM Kleine Leita NOVAK, CCC-SLP Rehab Admission Coordinator Share  02/05/2024  2:44 PM Yvone Delayne Tinnie SHAUNNA, CCC-SLP Rehab Admission Coordinator Share

## 2024-02-14 NOTE — Discharge Summary (Signed)
 Physician Discharge Summary  Reginald [name] FMW:[id number] DOB: 01-Oct-1935 DOA: 01/29/2024  PCP: System, Provider Not In  Admit date: 01/29/2024 Discharge date: 02/14/2024  Admitted From: Home Disposition: Inpatient rehab   Recommendations for Outpatient Follow-up:  Follow up with PCP in 1-2 weeks Please obtain BMP/CBC in one week   Home Health:No Equipment/Devices:None  Discharge Condition:Stable CODE STATUS:Full Diet recommendation: Heart Healthy  Brief/Interim Summary: 89 y.o. male past medical history of diabetes mellitus type 2, coronary artery disease with a history of CABG, interstitial lung disease on 5 L of oxygen at baseline, chronic systolic heart failure with an EF of 25%, severe peripheral arterial disease with right foot gangrene who completed course of 3 weeks of antibiotics for right foot infection presents to the hospital with worsening pain redness of the right foot was found to have critical limb ischemia vascular surgery was consulted underwent AKA, postop was admitted to the ICU for observation, transfer out of 02/01/2023 developed new onset A-fib started on metoprolol  now rate control and Eliquis . Evaluated by physical therapy awaiting inpatient rehab bed  Discharge Diagnoses:  Principal Problem:   Foot infection Active Problems:   Gangrene of right foot (HCC)   DM II (diabetes mellitus, type II), controlled (HCC)   HFrEF (heart failure with reduced ejection fraction) (HCC)   On mechanically assisted ventilation (HCC)   Acute respiratory failure with hypoxia (HCC)   ILD (interstitial lung disease) (HCC)   Acute on chronic heart failure with preserved ejection fraction (HFpEF) (HCC)   Acute on chronic respiratory failure with hypoxia (HCC)   Typical atrial flutter (HCC)  Critical limb ischemia right foot gangrene status post AKA: 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 Now off antibiotics. Will need to follow-up with vascular surgery in 4 weeks for staple removal. PT  evaluated the patient will go to inpatient rehab.   Acute on chronic respiratory failure with hypoxia due to IPF: Follows at the [facility] pulmonary clinic. At baseline 5 L of oxygen. Out of bed to chair as tolerated. Continue taper down steroids. Restart CellCept tomorrow. Continue inhalers. Diuresis was held due to hypotension. Continue Bactrim  3 times a week while he is on high-dose steroids.   Acute on chronic HFrEF with an EF of 25% grade 2 diastolic dysfunction: Continue metoprolol  and losartan . Aldactone was held due to hyperkalemia. Diuretics on held due to hypotension. Need ischemic cardiac evaluation as an outpatient. Hold home off diuretics.   CAD with a history of CABG: Continue Lipitor, aspirin  gemfibrozil  metoprolol  and losartan . Blood pressure seems to be controlled.   GERD:  continue PPI.   Insulin  dependent diabetes mellitus type 2: Continue long-acting insulin  plus sliding scale insulin . Blood glucose relatively well-controlled.   Anemia of chronic disease: No signs of overt bleeding hemoglobin stable.   Leukocytosis: Likely due to steroids currently tapering down.   New onset atrial fibrillation: Continue metoprolol  and Eliquis .   Hypophosphatemia: Repleted now improved.  Discharge Instructions  Discharge Instructions     Increase activity slowly   Complete by: As directed    No wound care   Complete by: As directed       Allergies as of 02/14/2024       Reactions   Naproxen Itching, Rash   Roxicet [oxycodone-acetaminophen ] Itching, Rash        Medication List     PAUSE taking these medications    furosemide  40 MG tablet Wait to take this until your doctor or other care provider tells you to start again. Commonly  known as: LASIX  Take 40 mg by mouth daily.       STOP taking these medications    B COMPLEX 100 PO   levofloxacin 500 MG tablet Commonly known as: LEVAQUIN       TAKE these medications    albuterol 108 (90  Base) MCG/ACT inhaler Commonly known as: VENTOLIN HFA Inhale 1 puff into the lungs every 6 (six) hours as needed for wheezing or shortness of breath.   Anoro Ellipta  62.5-25 MCG/ACT Aepb Generic drug: umeclidinium-vilanterol Inhale 1 puff into the lungs daily.   apixaban  5 MG Tabs tablet Commonly known as: ELIQUIS  Take 1 tablet (5 mg total) by mouth 2 (two) times daily.   ascorbic acid  500 MG tablet Commonly known as: VITAMIN C  Take 500 mg by mouth daily at 2 PM.   aspirin  EC 81 MG tablet Take 81 mg by mouth at bedtime.   atorvastatin  40 MG tablet Commonly known as: LIPITOR Take 40 mg by mouth daily. What changed: Another medication with the same name was removed. Continue taking this medication, and follow the directions you see here.   empagliflozin 10 MG Tabs tablet Commonly known as: JARDIANCE Take 10 mg by mouth daily.   fenofibrate  54 MG tablet Take 1 tablet (54 mg total) by mouth daily. Start taking on: February 15, 2024   fexofenadine 180 MG tablet Commonly known as: ALLEGRA Take 180 mg by mouth daily as needed (for allergies.).   Fish Oil 1000 MG Caps Take 1,000 mg by mouth every evening.   gabapentin  300 MG capsule Commonly known as: NEURONTIN  Take 300 mg by mouth 2 (two) times daily.   gemfibrozil  600 MG tablet Commonly known as: LOPID  Take 600 mg by mouth daily.   ipratropium 0.06 % nasal spray Commonly known as: ATROVENT  Place 2 sprays into both nostrils 3 (three) times daily.   LORazepam  0.5 MG tablet Commonly known as: ATIVAN  Take 0.5 mg by mouth every 6 (six) hours as needed for anxiety.   losartan  25 MG tablet Commonly known as: COZAAR  Take 0.5 tablets (12.5 mg total) by mouth daily. Start taking on: February 15, 2024 What changed: how much to take   metFORMIN  1000 MG tablet Commonly known as: GLUCOPHAGE  Take 1,000 mg by mouth See admin instructions. 1000 am 500 pm   metoprolol  succinate 25 MG 24 hr tablet Commonly known as:  TOPROL -XL Take 25 mg by mouth daily.   montelukast  10 MG tablet Commonly known as: SINGULAIR  Take 10 mg by mouth daily at 2 PM.   multivitamin with minerals Tabs tablet Take 1 tablet by mouth daily.   mycophenolate 500 MG tablet Commonly known as: CELLCEPT Take 500 mg by mouth 2 (two) times daily.   omeprazole 40 MG capsule Commonly known as: PRILOSEC Take 40 mg by mouth in the morning and at bedtime.   polyethylene glycol 17 g packet Commonly known as: MIRALAX  / GLYCOLAX  Take 17 g by mouth daily. Start taking on: February 15, 2024   predniSONE  10 MG tablet Commonly known as: DELTASONE  Takes  3 tablets for 1 days, then 2 tabs for 2 days, then 1 tab daily What changed: You were already taking a medication with the same name, and this prescription was added. Make sure you understand how and when to take each.   predniSONE  10 MG tablet Commonly known as: DELTASONE  Take 1 tablet (10 mg total) by mouth daily with breakfast. Start taking on: February 19, 2024 What changed: These instructions start on February 19, 2024. If you are unsure what to do until then, ask your doctor or other care provider.   sertraline 50 MG tablet Commonly known as: ZOLOFT Take 50 mg by mouth daily.   sodium chloride  0.65 % Soln nasal spray Commonly known as: OCEAN Place 1 spray into both nostrils daily as needed. Saline Mixture patient makes.   sulfamethoxazole -trimethoprim  800-160 MG tablet Commonly known as: BACTRIM  DS Take 1 tablet by mouth 3 (three) times a week for 7 days.   Vitamin D3 50 MCG (2000 UT) Tabs Take 2,000 Units by mouth daily at 2 PM.        Follow-up Information     [facility] Follow up in 1 month(s).   Specialty: Vascular Surgery Why: The office will call you with your appointment, staple removal Contact information: [address] [place]  [id number] [phone]                Allergies[1]  Consultations: Cardiology Vascular surgery  Procedures/Studies: ECHOCARDIOGRAM COMPLETE Result Date: 02/01/2024    ECHOCARDIOGRAM REPORT   Patient Name:   Reginald [name] Date of Exam: 02/01/2024 Medical Rec #:  [id number]   Height:       68.0 in Accession #:    [id number]  Weight:       157.2 lb Date of Birth:  1935/10/06    BSA:          1.845 m Patient Age:    88 years    BP:           104/89 mmHg Patient Gender: M           HR:           75 bpm. Exam Location:  Inpatient Procedure: 2D Echo, Cardiac Doppler, Color Doppler and Intracardiac            Opacification Agent (Both Spectral and Color Flow Doppler were            utilized during procedure). Indications:    CHF Acute Systolic I50.21  History:        Patient has no prior history of Echocardiogram examinations.                 CAD; Risk Factors:Hypertension and Diabetes.  Sonographer:    Tinnie Gosling RDCS Referring Phys: [phone] LINDSAY B ROBERTS IMPRESSIONS  1. Left ventricular ejection fraction, by estimation, is 20 to 25%. The left ventricle has severely decreased function. The left ventricle demonstrates global hypokinesis. There is moderate left ventricular hypertrophy. Left ventricular diastolic parameters are consistent with Grade II diastolic dysfunction (pseudonormalization). Elevated left atrial pressure.  2. Right ventricular systolic function is severely reduced. The right ventricular size is severely enlarged. There is moderately elevated pulmonary artery systolic pressure.  3. Left atrial size was mildly dilated.  4. Right atrial size was severely dilated.  5. The mitral valve is normal in structure. Trivial mitral valve regurgitation. No evidence of mitral stenosis. Moderate mitral annular calcification.  6. The aortic valve is tricuspid. Aortic valve regurgitation is not visualized. Aortic valve sclerosis/calcification is present, without any evidence of aortic stenosis.  7. The inferior vena cava is dilated in size with  <50% respiratory variability, suggesting right atrial pressure of 15 mmHg. FINDINGS  Left Ventricle: Left ventricular ejection fraction, by estimation, is 20 to 25%. The left ventricle has severely decreased function. The left ventricle demonstrates global hypokinesis.  Definity  contrast agent was given IV to delineate the left ventricular endocardial borders. The left ventricular internal cavity size was normal in size. There is moderate left ventricular hypertrophy. Left ventricular diastolic parameters are consistent with Grade II diastolic dysfunction (pseudonormalization).  Elevated left atrial pressure. Right Ventricle: The right ventricular size is severely enlarged. Right ventricular systolic function is severely reduced. There is moderately elevated pulmonary artery systolic pressure. The tricuspid regurgitant velocity is 2.84 m/s, and with an assumed right atrial pressure of 15 mmHg, the estimated right ventricular systolic pressure is 47.3 mmHg. Left Atrium: Left atrial size was mildly dilated. Right Atrium: Right atrial size was severely dilated. Pericardium: There is no evidence of pericardial effusion. Mitral Valve: The mitral valve is normal in structure. Moderate mitral annular calcification. Trivial mitral valve regurgitation. No evidence of mitral valve stenosis. Tricuspid Valve: The tricuspid valve is normal in structure. Tricuspid valve regurgitation is mild . No evidence of tricuspid stenosis. Aortic Valve: The aortic valve is tricuspid. Aortic valve regurgitation is not visualized. Aortic valve sclerosis/calcification is present, without any evidence of aortic stenosis. Pulmonic Valve: The pulmonic valve was normal in structure. Pulmonic valve regurgitation is not visualized. No evidence of pulmonic stenosis. Aorta: The aortic root is normal in size and structure. Venous: The inferior vena cava is dilated in size with less than 50% respiratory variability, suggesting right atrial pressure of 15  mmHg. IAS/Shunts: No atrial level shunt detected by color flow Doppler.  LEFT VENTRICLE PLAX 2D LVIDd:         4.30 cm      Diastology LVIDs:         3.10 cm      LV e' medial:   3.81 cm/s LV PW:         1.50 cm      LV E/e' medial: 21.7 LV IVS:        1.50 cm LVOT diam:     2.36 cm LV SV:         43 LV SV Index:   23 LVOT Area:     4.37 cm  LV Volumes (MOD) LV vol d, MOD A4C: 177.0 ml LV vol s, MOD A4C: 89.9 ml LV SV MOD A4C:     177.0 ml RIGHT VENTRICLE            IVC RV S prime:     8.59 cm/s  IVC diam: 2.75 cm LEFT ATRIUM             Index        RIGHT ATRIUM           Index LA diam:        4.02 cm 2.18 cm/m   RA Area:     26.30 cm LA Vol (A2C):   49.6 ml 26.89 ml/m  RA Volume:   89.70 ml  48.62 ml/m LA Vol (A4C):   30.1 ml 16.32 ml/m LA Biplane Vol: 40.6 ml 22.01 ml/m  AORTIC VALVE LVOT Vmax:   54.30 cm/s LVOT Vmean:  37.100 cm/s LVOT VTI:    0.098 m  AORTA Ao Root diam: 3.23 cm Ao Asc diam:  3.78 cm MITRAL VALVE               TRICUSPID VALVE MV Area (PHT): 6.22 cm    TR Peak grad:   32.3 mmHg MV Decel Time: 122 msec    TR Vmax:        284.00 cm/s MV E velocity: 82.80 cm/s  MV A velocity: 49.30 cm/s  SHUNTS MV E/A ratio:  1.68        Systemic VTI:  0.10 m                            Systemic Diam: 2.36 cm Redell Shallow MD Electronically signed by Redell Shallow MD Signature Date/Time: 02/01/2024/2:57:26 PM    Final    DG Chest Port 1 View Result Date: 01/30/2024 EXAM: 1 VIEW(S) XRAY OF THE CHEST 01/30/2024 03:04:00 PM COMPARISON: None available. CLINICAL HISTORY: The patient has respiratory failure. ICD10: 33498 - Respiratory failure (HCC). FINDINGS: LINES, TUBES AND DEVICES: ETT in place with tip 3.5 cm above carina. LUNGS AND PLEURA: Chronic reticular interstitial opacities are again noted with a lower lung zone predominance. Patchy opacities within the left mid and left lower lung are noted, which may reflect chronic scarring, atelectasis, or airspace disease. Indeterminate without prior imaging for  comparison. No pleural effusion. No pneumothorax. HEART AND MEDIASTINUM: Mild cardiomegaly with central vascular prominence. Sternotomy sutures and post CABG changes noted. Calcifications of aortic arch. BONES AND SOFT TISSUES: Osteopenia. No acute osseous abnormality. IMPRESSION: 1. Endotracheal tube in place with tip approximately 3.5 cm above the carina. 2. Chronic reticular interstitial opacities with lower lung zone predominance and superimposed patchy left mid and lower lung opacities. The patchy opacities are nonspecific and may reflect areas of chronic scarring, atelectasis, or superimposed infection. 3. Mild cardiomegaly with central vascular prominence, which may reflect mild pulmonary vascular congestion. Electronically signed by: Waddell Calk MD 01/30/2024 05:04 PM EST RP Workstation: [id number]   (Echo, Carotid, EGD, Colonoscopy, ERCP)    Subjective: No complaints  Discharge Exam: Vitals:   02/14/24 0747 02/14/24 0808  BP:  118/79  Pulse: 82 92  Resp: (!) 24 18  Temp:  98 F (36.7 C)  SpO2:  92%   Vitals:   02/13/24 2346 02/14/24 0409 02/14/24 0747 02/14/24 0808  BP: 112/72 127/77  118/79  Pulse: 75 75 82 92  Resp: 20 20 (!) 24 18  Temp: (!) 97.4 F (36.3 C) 97.6 F (36.4 C)  98 F (36.7 C)  TempSrc: Oral Oral  Oral  SpO2:    92%  Weight:  73.9 kg    Height:        General: Pt is alert, awake, not in acute distress Cardiovascular: RRR, S1/S2 +, no rubs, no gallops Respiratory: CTA bilaterally, no wheezing, no rhonchi Abdominal: Soft, NT, ND, bowel sounds + Extremities: no edema, no cyanosis    The results of significant diagnostics from this hospitalization (including imaging, microbiology, ancillary and laboratory) are listed below for reference.     Microbiology: No results found for this or any previous visit (from the past 240 hours).   Labs: BNP (last 3 results) No results for input(s): BNP in the last 8760 hours. Basic Metabolic Panel: Recent  Labs  Lab 02/07/24 1019 02/10/24 0328  NA 135 135  K 4.4 4.7  CL 91* 97*  CO2 36* 30  GLUCOSE 153* 101*  BUN 37* 24*  CREATININE 0.70 0.61  CALCIUM  9.3 9.1  MG 2.1 1.9  PHOS  --  2.0*   Liver Function Tests: No results for input(s): AST, ALT, ALKPHOS, BILITOT, PROT, ALBUMIN  in the last 168 hours. No results for input(s): LIPASE, AMYLASE in the last 168 hours. No results for input(s): AMMONIA in the last 168 hours. CBC: Recent Labs  Lab 02/10/24 0328 02/11/24 0818  WBC  12.1* 13.2*  HGB 10.5* 11.4*  HCT 35.9* 38.3*  MCV 84.9 84.2  PLT 367 413*   Cardiac Enzymes: No results for input(s): CKTOTAL, CKMB, CKMBINDEX, TROPONINI in the last 168 hours. BNP: Invalid input(s): POCBNP CBG: Recent Labs  Lab 02/13/24 0649 02/13/24 1132 02/13/24 1649 02/13/24 2114 02/14/24 0616  GLUCAP 114* 109* 218* 164* 118*   D-Dimer No results for input(s): DDIMER in the last 72 hours. Hgb A1c No results for input(s): HGBA1C in the last 72 hours. Lipid Profile No results for input(s): CHOL, HDL, LDLCALC, TRIG, CHOLHDL, LDLDIRECT in the last 72 hours. Thyroid function studies No results for input(s): TSH, T4TOTAL, T3FREE, THYROIDAB in the last 72 hours.  Invalid input(s): FREET3 Anemia work up No results for input(s): VITAMINB12, FOLATE, FERRITIN, TIBC, IRON, RETICCTPCT in the last 72 hours. Urinalysis No results found for: COLORURINE, APPEARANCEUR, LABSPEC, PHURINE, GLUCOSEU, HGBUR, BILIRUBINUR, KETONESUR, PROTEINUR, UROBILINOGEN, NITRITE, LEUKOCYTESUR Sepsis Labs Recent Labs  Lab 02/10/24 0328 02/11/24 0818  WBC 12.1* 13.2*   Microbiology No results found for this or any previous visit (from the past 240 hours).   Time coordinating discharge: Over 30 minutes  SIGNED:   Erle Odell Castor, MD  [facility] 02/14/2024, 9:55 AM Pager   If 7PM-7AM, please contact  night-coverage [email or web address] Password [email or web address]     [1]  Allergies Allergen Reactions   Naproxen Itching and Rash   Roxicet [Oxycodone-Acetaminophen ] Itching and Rash

## 2024-02-14 NOTE — Progress Notes (Signed)
 [facility] PROGRESS NOTE    Progress Note  Reginald [name]  FMW:[id number] DOB: 09-Jun-1935 DOA: 01/29/2024 PCP: System, Provider Not In     Brief Narrative:   Reginald [name] is an 89 y.o. male past medical history of diabetes mellitus type 2, coronary artery disease with a history of CABG, interstitial lung disease on 5 L of oxygen at baseline, chronic systolic heart failure with an EF of 25%, severe peripheral arterial disease with right foot gangrene who completed course of 3 weeks of antibiotics for right foot infection presents to the hospital with worsening pain redness of the right foot was found to have critical limb ischemia vascular surgery was consulted underwent AKA, postop was admitted to the ICU for observation, transfer out of 02/01/2023 developed new onset A-fib started on metoprolol  now rate control and Eliquis . Evaluated by physical therapy awaiting inpatient rehab bed   Assessment/Plan:   Critical limb ischemia right foot gangrene status post AKA: 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 Now off antibiotics. Will need to follow-up with vascular surgery in 4 weeks for staple removal. PT evaluated the patient, awaiting inpatient rehab placement.  Acute on chronic respiratory failure with hypoxia due to IPF: Follows at the [facility] pulmonary clinic. At baseline 5 L of oxygen. Out of bed to chair as tolerated. Continue taper down steroids. Holding CellCept due to infection can be restarted as an outpatient. Continue inhalers. Diuresis was [name] due to hypotension. Continue Bactrim  3 times a week while he is on high-dose steroids.  Acute on chronic HFrEF with an EF of 25% grade 2 diastolic dysfunction: Continue metoprolol  and losartan . Aldactone was [name] due to hyperkalemia. Diuretics on [name] due to hypotension. Needed for ischemic evaluation as an outpatient. Cardiology has  sign off. Cardiology recommended discharge off diuretics.  CAD with a history of CABG: Continue Lipitor,  aspirin  gemfibrozil  metoprolol  and losartan . Blood pressure seems to be controlled.  GERD:  continue PPI.  Insulin  dependent diabetes mellitus type 2: Continue long-acting insulin  plus sliding scale insulin . Blood glucose relatively well-controlled.  Anemia of chronic disease: No signs of overt bleeding hemoglobin stable.  Leukocytosis: Likely due to steroids currently tapering down.  New onset atrial fibrillation: Continue metoprolol  and Eliquis . Will need cardioversion as an outpatient.  Hypophosphatemia: Repleted now improved.  DVT prophylaxis: Eliquis  Family Communication:wife Status is: Inpatient Remains inpatient appropriate because: Critical limb ischemia    Code Status:     Code Status Orders  (From admission, onward)           Start     Ordered   01/29/24 1629  Full code  Continuous       Question:  By:  Answer:  Consent: discussion documented in EHR   01/29/24 1630           Code Status History     Date Active Date Inactive Code Status Order ID Comments User Context   01/29/2024 1627 01/29/2024 1630 Full Code [id number]  Reginald Held, MD Inpatient   08/30/2016 1954 08/31/2016 1528 Full Code [id number]  Reginald Purchase, MD Inpatient   05/21/2014 2008 05/23/2014 1438 Full Code [id number]  Reginald Purchase, MD Inpatient   04/21/2014 0807 05/01/2014 0424 Full Code [id number]  Reginald Dicker, MD HOV         IV Access:   Peripheral IV   Procedures and diagnostic studies:   No results found.   Medical Consultants:   None.   Subjective:    Reginald [name] no complaints  Objective:  Vitals:   02/13/24 2346 02/14/24 0409 02/14/24 0747 02/14/24 0808  BP: 112/72 127/77  118/79  Pulse: 75 75 82 92  Resp: 20 20 (!) 24 18  Temp: (!) 97.4 F (36.3 C) 97.6 F (36.4 C)  98 F (36.7 C)  TempSrc: Oral Oral  Oral  SpO2:    92%  Weight:  73.9 kg    Height:       SpO2: 92 % O2 Flow Rate (L/min): 5 L/min FiO2 (%): 40 %   Intake/Output  Summary (Last 24 hours) at 02/14/2024 0840 Last data filed at 02/14/2024 0412 Gross per 24 hour  Intake 714 ml  Output 2725 ml  Net -2011 ml   Filed Weights   02/13/24 0426 02/13/24 0559 02/14/24 0409  Weight: 74.8 kg 75.3 kg 73.9 kg    Exam: General exam: In no acute distress. Respiratory system: Good air movement and clear to auscultation. Cardiovascular system: S1 & S2 heard, RRR. No JVD. Gastrointestinal system: Abdomen is nondistended, soft and nontender.  Extremities: No pedal edema. Skin: No rashes, lesions or ulcers Psychiatry: Judgement and insight appear normal. Mood & affect appropriate.    Data Reviewed:    Labs: Basic Metabolic Panel: Recent Labs  Lab 02/07/24 1019 02/10/24 0328  NA 135 135  K 4.4 4.7  CL 91* 97*  CO2 36* 30  GLUCOSE 153* 101*  BUN 37* 24*  CREATININE 0.70 0.61  CALCIUM  9.3 9.1  MG 2.1 1.9  PHOS  --  2.0*   GFR Estimated Creatinine Clearance: 64.1 mL/min (by C-G formula based on SCr of 0.61 mg/dL). Liver Function Tests: No results for input(s): AST, ALT, ALKPHOS, BILITOT, PROT, ALBUMIN  in the last 168 hours. No results for input(s): LIPASE, AMYLASE in the last 168 hours. No results for input(s): AMMONIA in the last 168 hours. Coagulation profile No results for input(s): INR, PROTIME in the last 168 hours. COVID-19 Labs  No results for input(s): DDIMER, FERRITIN, LDH, CRP in the last 72 hours.  No results found for: SARSCOV2NAA  CBC: Recent Labs  Lab 02/10/24 0328 02/11/24 0818  WBC 12.1* 13.2*  HGB 10.5* 11.4*  HCT 35.9* 38.3*  MCV 84.9 84.2  PLT 367 413*   Cardiac Enzymes: No results for input(s): CKTOTAL, CKMB, CKMBINDEX, TROPONINI in the last 168 hours. BNP (last 3 results) No results for input(s): PROBNP in the last 8760 hours. CBG: Recent Labs  Lab 02/13/24 0649 02/13/24 1132 02/13/24 1649 02/13/24 2114 02/14/24 0616  GLUCAP 114* 109* 218* 164* 118*    D-Dimer: No results for input(s): DDIMER in the last 72 hours. Hgb A1c: No results for input(s): HGBA1C in the last 72 hours. Lipid Profile: No results for input(s): CHOL, HDL, LDLCALC, TRIG, CHOLHDL, LDLDIRECT in the last 72 hours. Thyroid function studies: No results for input(s): TSH, T4TOTAL, T3FREE, THYROIDAB in the last 72 hours.  Invalid input(s): FREET3 Anemia work up: No results for input(s): VITAMINB12, FOLATE, FERRITIN, TIBC, IRON, RETICCTPCT in the last 72 hours. Sepsis Labs: Recent Labs  Lab 02/10/24 0328 02/11/24 0818  WBC 12.1* 13.2*   Microbiology No results found for this or any previous visit (from the past 240 hours).   Medications:    apixaban   5 mg Oral BID   arformoterol   15 mcg Nebulization BID   ascorbic acid   500 mg Oral Q1400   atorvastatin   40 mg Oral Q lunch   cholecalciferol   2,000 Units Oral Q1400   fenofibrate   54 mg Oral Daily  gabapentin   300 mg Oral BID   insulin  aspart  0-5 Units Subcutaneous QHS   insulin  aspart  0-9 Units Subcutaneous TID WC   ipratropium  2 spray Each Nare TID   losartan   12.5 mg Oral Daily   metoprolol  succinate  12.5 mg Oral Daily   multivitamin with minerals  1 tablet Oral Daily   pantoprazole   40 mg Oral Daily   polyethylene glycol  17 g Oral Daily   predniSONE   40 mg Oral Q breakfast   revefenacin   175 mcg Nebulization Daily   senna  1 tablet Oral BID   sodium chloride  flush  3 mL Intravenous Q12H   sulfamethoxazole -trimethoprim   1 tablet Oral Once per day on Monday Wednesday Friday   Continuous Infusions:    LOS: 16 days   Erle Odell Castor  [facility]  02/14/2024, 8:40 AM

## 2024-02-14 NOTE — Progress Notes (Signed)
 Inpatient Rehab Admissions Coordinator:    I have a CIR bed for this Pt. Today. RN may call report to [phone].  Pt. To dc to CIR for an estimated 18-21 days with the goal of dc home with min-mod A.  Leita Kleine, MS, CCC-SLP Rehab Admissions Coordinator  [phone] (celll) [phone] (office)

## 2024-02-15 DIAGNOSIS — S78111A Complete traumatic amputation at level between right hip and knee, initial encounter: Secondary | ICD-10-CM | POA: Diagnosis not present

## 2024-02-15 LAB — GLUCOSE, CAPILLARY
Glucose-Capillary: 86 mg/dL (ref 70–99)
Glucose-Capillary: 128 mg/dL — ABNORMAL HIGH (ref 70–99)
Glucose-Capillary: 176 mg/dL — ABNORMAL HIGH (ref 70–99)
Glucose-Capillary: 166 mg/dL — ABNORMAL HIGH (ref 70–99)

## 2024-02-15 LAB — CBC WITH DIFFERENTIAL/PLATELET
Abs Immature Granulocytes: 0.13 10*3/uL — ABNORMAL HIGH (ref 0.00–0.07)
Basophils Absolute: 0 10*3/uL (ref 0.0–0.1)
Basophils Relative: 0 %
Eosinophils Absolute: 0.1 10*3/uL (ref 0.0–0.5)
Eosinophils Relative: 1 %
HCT: 38.7 % — ABNORMAL LOW (ref 39.0–52.0)
Hemoglobin: 11.4 g/dL — ABNORMAL LOW (ref 13.0–17.0)
Immature Granulocytes: 1 %
Lymphocytes Relative: 13 %
Lymphs Abs: 1.6 10*3/uL (ref 0.7–4.0)
MCH: 24.7 pg — ABNORMAL LOW (ref 26.0–34.0)
MCHC: 29.5 g/dL — ABNORMAL LOW (ref 30.0–36.0)
MCV: 83.9 fL (ref 80.0–100.0)
Monocytes Absolute: 1 10*3/uL (ref 0.1–1.0)
Monocytes Relative: 8 %
Neutro Abs: 8.9 10*3/uL — ABNORMAL HIGH (ref 1.7–7.7)
Neutrophils Relative %: 77 %
Platelets: 379 10*3/uL (ref 150–400)
RBC: 4.61 MIL/uL (ref 4.22–5.81)
RDW: 17.5 % — ABNORMAL HIGH (ref 11.5–15.5)
WBC: 11.8 10*3/uL — ABNORMAL HIGH (ref 4.0–10.5)
nRBC: 0 % (ref 0.0–0.2)

## 2024-02-15 LAB — COMPREHENSIVE METABOLIC PANEL WITH GFR
ALT: 19 U/L (ref 0–44)
AST: 19 U/L (ref 15–41)
Albumin: 3.4 g/dL — ABNORMAL LOW (ref 3.5–5.0)
Alkaline Phosphatase: 70 U/L (ref 38–126)
Anion gap: 10 (ref 5–15)
BUN: 22 mg/dL (ref 8–23)
CO2: 25 mmol/L (ref 22–32)
Calcium: 9 mg/dL (ref 8.9–10.3)
Chloride: 100 mmol/L (ref 98–111)
Creatinine, Ser: 0.63 mg/dL (ref 0.61–1.24)
GFR, Estimated: >60 mL/min
Glucose, Bld: 84 mg/dL (ref 70–99)
Potassium: 4.8 mmol/L (ref 3.5–5.1)
Sodium: 135 mmol/L (ref 135–145)
Total Bilirubin: 0.5 mg/dL (ref 0.0–1.2)
Total Protein: 5.8 g/dL — ABNORMAL LOW (ref 6.5–8.1)

## 2024-02-15 NOTE — Plan of Care (Signed)
" °  Problem: RH Balance Goal: LTG: Patient will maintain dynamic sitting balance (OT) Description: LTG:  Patient will maintain dynamic sitting balance with assistance during activities of daily living (OT) Flowsheets (Taken 02/15/2024 1246) LTG: Pt will maintain dynamic sitting balance during ADLs with: Supervision/Verbal cueing   Problem: RH Dressing Goal: LTG Patient will perform upper body dressing (OT) Description: LTG Patient will perform upper body dressing with assist, with/without cues (OT). Flowsheets (Taken 02/15/2024 1246) LTG: Pt will perform upper body dressing with assistance level of: Supervision/Verbal cueing Goal: LTG Patient will perform lower body dressing w/assist (OT) Description: LTG: Patient will perform lower body dressing with assist, with/without cues in positioning using equipment (OT) Flowsheets (Taken 02/15/2024 1246) LTG: Pt will perform lower body dressing with assistance level of: Minimal Assistance - Patient > 75%   Problem: RH Toileting Goal: LTG Patient will perform toileting task (3/3 steps) with assistance level (OT) Description: LTG: Patient will perform toileting task (3/3 steps) with assistance level (OT)  Flowsheets (Taken 02/15/2024 1246) LTG: Pt will perform toileting task (3/3 steps) with assistance level: Minimal Assistance - Patient > 75%   Problem: RH Toilet Transfers Goal: LTG Patient will perform toilet transfers w/assist (OT) Description: LTG: Patient will perform toilet transfers with assist, with/without cues using equipment (OT) Flowsheets (Taken 02/15/2024 1246) LTG: Pt will perform toilet transfers with assistance level of: Minimal Assistance - Patient > 75%   Problem: RH Memory Goal: LTG Patient will demonstrate ability for day to day recall/carry over during activities of daily living with assistance level (OT) Description: LTG:  Patient will demonstrate ability for day to day recall/carry over during activities of daily living with  assistance level (OT). Flowsheets (Taken 02/15/2024 1246) LTG:  Patient will demonstrate ability for day to day recall/carry over during activities of daily living with assistance level (OT): Moderate Assistance - Patient 50 - 74%   "

## 2024-02-15 NOTE — Evaluation (Signed)
 Physical Therapy Assessment and Plan  Patient Details  Name: Reginald [name] MRN: [id number] Date of Birth: Mar 17, 1935  PT Diagnosis: Difficulty walking and Muscle weakness Rehab Potential: Good ELOS: 2-3 Weeks   Today's Date: 02/15/2024 PT Individual Time: 9196-9085 PT Individual Time Calculation (min): 71 min    Hospital Problem: Principal Problem:   Above-knee amputation of right lower extremity (HCC)   Past Medical History:  Past Medical History:  Diagnosis Date   Arthritis    Complication of anesthesia    2014 BP DROPPED S/P BACK SURGERY REC. IV FLUIDS   Diabetes mellitus without complication (HCC)    Disc displacement, lumbar    Full dentures    GERD (gastroesophageal reflux disease)    Hearing deficit    does not wear H/A   History of kidney stones    Hypertension    Kidney stone    Neuromuscular disorder (HCC)    Pneumonia    Umbilical hernia    Wears glasses    Past Surgical History:  Past Surgical History:  Procedure Laterality Date   AMPUTATION Right 01/30/2024   Procedure: Right above the Knee AMPUTATION.;  Surgeon: Pearline Norman RAMAN, MD;  Location: [facility] OR;  Service: Vascular;  Laterality: Right;   BACK SURGERY     CATARACT EXTRACTION W/ INTRAOCULAR LENS  IMPLANT, BILATERAL     COLONOSCOPY W/ BIOPSIES AND POLYPECTOMY     LUMBAR LAMINECTOMY/DECOMPRESSION MICRODISCECTOMY Right 08/30/2016   Procedure: Right lumbar three-four microdiscectomy;  Surgeon: Mavis Purchase, MD;  Location: [facility] OR;  Service: Neurosurgery;  Laterality: Right;   MULTIPLE TOOTH EXTRACTIONS     NOSE SURGERY     deviated septum, and blockage   rottor cuff repair      left     Assessment & Plan Clinical Impression: Patient is a 89 year old male with history of T2DM, CAD s/p CABG, ICM LVEF -25%, severe PAD, pulmonary HTN- 5 L oxygen dependent, interstitial lung disease on Cellcept,  right foot infection treated with antibiotics but continued to worsen with gangrenous changes and was transferred  from OSH to [facility] for management. He was evaluated by Dr. Pearline and found to have severe tissue loss of entire forefoot with wet and dry gangrene with cellulitis up to mid calf. Comfort care v/s AKA discussed and family elected on surgical intervention. He underwent R-AKA on 01/13 and transferred to ICU due to high oxygen needs.  He completed 24 hours antibiotics post op and diuresed for acute on chronic systolic CHF.    Cardiology consulted for input on  A flutter with RVR and recommended rate control as well as Eliquis  for stroke prevention. 2 D echo done revealing Ef 20-25% with severe LVD, severe reduction in RVF with severe enlargement of RV, severe RA. He was started on IV heparin  and transitioned to Eliquis  and ASA d/c. He developed respiratory distress with tachypnea on 01/17 and started on IV lasix  bid to 40 mg qid for volume overload and medications adjusted. Respiratory status improved but did develop hypotension requiring IVF therefore diuretics d/c as now euvolemic. Leucocytosis being monitored and ABLA stable. Posterior ecchymosis right thigh being monitored. Cellcept on hold with recommendations to resume on outpatient basis.  Has altered sensation in left foot due to neuropathy.  Chronic double vision, has had prior workup. Has prism  glasses that help. PT/OT has been working with patient who requires max assist +2 for standing in [place] with strong right lean and difficulty extending left/knee trunk   Patient transferred  to CIR on 02/14/2024 .   Patient currently requires max with mobility secondary to muscle weakness, decreased cardiorespiratoy endurance, and decreased sitting balance, decreased standing balance, decreased postural control, and decreased balance strategies.  Prior to hospitalization, patient was modified independent  with mobility and lived with Spouse in a House home.  Home access is  Ramped entrance.  Patient will benefit from skilled PT intervention to maximize safe functional  mobility, minimize fall risk, and decrease caregiver burden for planned discharge home with 24 hour assist.  Anticipate patient will benefit from follow up [facility] at discharge.  PT - End of Session Activity Tolerance: Tolerates 30+ min activity with multiple rests Endurance Deficit: Yes PT Assessment Rehab Potential (ACUTE/IP ONLY): Good PT Barriers to Discharge: Home environment access/layout;Weight bearing restrictions PT Patient demonstrates impairments in the following area(s): Balance;Endurance;Motor;Pain;Safety;Sensory;Skin Integrity PT Transfers Functional Problem(s): Bed Mobility;Bed to Chair;Car PT Locomotion Functional Problem(s): Ambulation;Wheelchair Mobility;Stairs PT Plan PT Intensity: Minimum of 1-2 x/day ,45 to 90 minutes PT Frequency: 5 out of 7 days PT Duration Estimated Length of Stay: 2-3 Weeks PT Treatment/Interventions: [employer];Ambulation/gait training;DME/adaptive equipment instruction;Neuromuscular re-education;Psychosocial support;Stair training;UE/LE Strength taining/ROM;Wheelchair propulsion/positioning;Balance/vestibular training;Discharge planning;Functional electrical stimulation;Pain management;Skin care/wound management;Therapeutic Activities;UE/LE Coordination activities;Cognitive remediation/compensation;Disease management/prevention;Functional mobility training;Splinting/orthotics;Patient/family education;Therapeutic Exercise;Visual/perceptual remediation/compensation PT Transfers Anticipated Outcome(s): MinA PT Locomotion Anticipated Outcome(s): Supervision WC level PT Recommendation Recommendations for Other Services: Therapeutic Recreation consult Therapeutic Recreation Interventions: Stress management Follow Up Recommendations: Home health PT;24 hour supervision/assistance Patient destination: Home Equipment Recommended: To be determined   PT Evaluation Precautions/Restrictions Precautions Precautions: Fall Precaution/Restrictions  Comments: R AKA NWB; 5L O2 [place] (chronic ~1 yr) Restrictions Weight Bearing Restrictions Per Provider Order: Yes RLE Weight Bearing Per Provider Order: Non weight bearing General Chart Reviewed: Yes Family/Caregiver Present: No  Pain Interference Pain Interference Pain Effect on Sleep: 1. Rarely or not at all Pain Interference with Therapy Activities: 1. Rarely or not at all Pain Interference with Day-to-Day Activities: 1. Rarely or not at all Home Living/Prior Functioning Home Living Type of Home: House Home Access: Ramped entrance Home Layout: Able to live on main level with bedroom/bathroom Bathroom Shower/Tub: Sponge bathes at baseline Bathroom Toilet: Handicapped height Bathroom Accessibility: Yes  Lives With: Spouse Prior Function Level of Independence: Requires assistive device for independence;Needs assistance with ADLs Bath: Maximal Toileting: Maximal Dressing: Maximal Driving: No Vision/Perception  Vision - History Ability to See in Adequate Light: 1 Impaired Perception Perception: Within Functional Limits Praxis Praxis: WFL  Cognition Overall Cognitive Status: No family/caregiver present to determine baseline cognitive functioning Arousal/Alertness: Awake/alert Memory: Impaired Memory Impairment: Retrieval deficit;Decreased recall of new information;Storage deficit Sensation Sensation Light Touch: Impaired Detail Peripheral sensation comments: Reports intermediate phantom limb painl; history of PAD. Coordination Gross Motor Movements are Fluid and Coordinated: No Fine Motor Movements are Fluid and Coordinated: Yes Motor  Motor Motor: Other (comment) Motor - Skilled Clinical Observations: Deconditioned  Trunk/Postural Assessment  Cervical Assessment Cervical Assessment: Exceptions to [facility] (forward head) Thoracic Assessment Thoracic Assessment: Exceptions to [facility] (rounded shoulders) Lumbar Assessment Lumbar Assessment: Exceptions to [facility] (posterior pelvic  tilt) Postural Control Postural Control: Deficits on evaluation (delayed)  Balance Balance Balance Assessed: Yes Static Sitting Balance Static Sitting - Balance Support: Feet supported Static Sitting - Level of Assistance: 5: Stand by assistance Dynamic Sitting Balance Dynamic Sitting - Balance Support: Feet supported Dynamic Sitting - Level of Assistance: 4: Min assist Extremity Assessment  RLE Assessment RLE Assessment: Exceptions to [facility] General Strength Comments: Not tested due to AKA LLE Assessment LLE Assessment:  Exceptions to [facility] General Strength Comments: Grossly 3+/5  Care Tool Care Tool Bed Mobility Roll left and right activity   Roll left and right assist level: Minimal Assistance - Patient > 75%    Sit to lying activity   Sit to lying assist level: Minimal Assistance - Patient > 75%    Lying to sitting on side of bed activity   Lying to sitting on side of bed assist level: the ability to move from lying on the back to sitting on the side of the bed with no back support.: Minimal Assistance - Patient > 75%     Care Tool Transfers Sit to stand transfer Sit to stand activity did not occur: Safety/medical concerns      Chair/bed transfer   Chair/bed transfer assist level: 2 Theatre Stage Manager transfer assist level: 2 helpers (slideboard)      Care Tool Locomotion Ambulation Ambulation activity did not occur: Safety/medical concerns        Walk 10 feet activity Walk 10 feet activity did not occur: Safety/medical concerns       Walk 50 feet with 2 turns activity Walk 50 feet with 2 turns activity did not occur: Safety/medical concerns      Walk 150 feet activity Walk 150 feet activity did not occur: Safety/medical concerns      Walk 10 feet on uneven surfaces activity Walk 10 feet on uneven surfaces activity did not occur: Safety/medical concerns      Stairs Stair activity did not occur: Safety/medical concerns        Walk up/down 1  step activity Walk up/down 1 step or curb (drop down) activity did not occur: Safety/medical concerns      Walk up/down 4 steps activity Walk up/down 4 steps activity did not occur: Safety/medical concerns      Walk up/down 12 steps activity Walk up/down 12 steps activity did not occur: Safety/medical concerns      Pick up small objects from floor Pick up small object from the floor (from standing position) activity did not occur: Safety/medical concerns      Wheelchair Is the patient using a wheelchair?: Yes Type of Wheelchair: Manual   Wheelchair assist level: Minimal Assistance - Patient > 75% Max wheelchair distance: 50'  Wheel 50 feet with 2 turns activity   Assist Level: Minimal Assistance - Patient > 75%  Wheel 150 feet activity   Assist Level: Maximal Assistance - Patient 25 - 49%    Refer to Care Plan for Long Term Goals  SHORT TERM GOAL WEEK 1 PT Short Term Goal 1 (Week 1): Pt will perform bed mobility with CGA. PT Short Term Goal 2 (Week 1): Pt will complete sit to stand with modA. PT Short Term Goal 3 (Week 1): Pt will complete bed to chair with modA consistently. PT Short Term Goal 4 (Week 1): Pt will self propel WC x100' wiht BUEs and minA.  Recommendations for other services: Therapeutic Recreation  Stress management  Skilled Therapeutic Intervention  Evaluation completed (see details above and below) with education on PT POC and goals and individual treatment initiated with focus on bed mobility, balance, transfers, and car transfer. Pt received semi reclined in bed receiving breathing treatment. Pt does not complain of pain. Pt performs supine to sit with minA and cues for sequencing, use of bed features, and positioning. PT provides education on body mechanics, head hips relationship, and hand placement. Pt then completes bed to [facility]  transfer with maxA +2 and squat pivot technique, with cues for initiation and anterior weight shift. WC transport to gym. Pt completes  slideboard transfer from [facility] to car with maxA +2 and cues for body mechanics, hand placement, sequencing, and positioning. Following rest break, pt self propels WC x50' with BUEs and minA due to consistent Lt listing, with cues for propulsion technique. WC transport back to room. Left seated with all needs within reach.  Mobility Bed Mobility Bed Mobility: Supine to Sit;Sit to Supine Supine to Sit: Minimal Assistance - Patient > 75% Sit to Supine: Minimal Assistance - Patient > 75% Transfers Transfers: Lobbyist Pivot Transfers: Maximal Assistance - Patient 25-49%;2 Advertising Account Planner (Assistive device): None Locomotion  Gait Ambulation: No Gait Gait: No Stairs / Additional Locomotion Stairs: No Corporate Treasurer: Yes Wheelchair Assistance: Minimal assistance - Patient >75% Wheelchair Propulsion: Both lower extermities Wheelchair Parts Management: Needs assistance Distance: 25'   Discharge Criteria: Patient will be discharged from PT if patient refuses treatment 3 consecutive times without medical reason, if treatment goals not met, if there is a change in medical status, if patient makes no progress towards goals or if patient is discharged from hospital.  The above assessment, treatment plan, treatment alternatives and goals were discussed and mutually agreed upon: by patient  Elsie JAYSON Dawn, PT, DPT 02/15/2024, 4:16 PM

## 2024-02-15 NOTE — Plan of Care (Addendum)
 Patient had no overall acute overnight events. Nurse tech reported a low Spo2 78% at some point, patient propped up, on oxygen via nasal canula, rechecked and sats returned to 99-100% on oxygen.    Problem: Consults Goal: RH LIMB LOSS PATIENT EDUCATION Description: Description: See Patient Education module for eduction specifics. Outcome: Progressing Goal: Skin Care Protocol Initiated - if Braden Score 18 or less Description: If consults are not indicated, leave blank or document N/A Outcome: Progressing Goal: Nutrition Consult-if indicated Outcome: Progressing Goal: Diabetes Guidelines if Diabetic/Glucose > 140 Description: If diabetic or lab glucose is > 140 mg/dl - Initiate Diabetes/Hyperglycemia Guidelines & Document Interventions  Outcome: Progressing   Problem: RH BOWEL ELIMINATION Goal: RH STG MANAGE BOWEL WITH ASSISTANCE Description: STG Manage Bowel with Assistance. Outcome: Progressing Goal: RH STG MANAGE BOWEL W/MEDICATION W/ASSISTANCE Description: STG Manage Bowel with Medication with Assistance. Outcome: Progressing   Problem: RH BLADDER ELIMINATION Goal: RH STG MANAGE BLADDER WITH ASSISTANCE Description: STG Manage Bladder With Assistance Outcome: Progressing   Problem: RH SKIN INTEGRITY Goal: RH STG MAINTAIN SKIN INTEGRITY WITH ASSISTANCE Description: STG Maintain Skin Integrity With Assistance. Outcome: Progressing Goal: RH STG ABLE TO PERFORM INCISION/WOUND CARE W/ASSISTANCE Description: STG Able To Perform Incision/Wound Care With Assistance. Outcome: Progressing   Problem: RH SAFETY Goal: RH STG ADHERE TO SAFETY PRECAUTIONS W/ASSISTANCE/DEVICE Description: STG Adhere to Safety Precautions With Assistance/Device. Outcome: Progressing Goal: RH STG DECREASED RISK OF FALL WITH ASSISTANCE Description: STG Decreased Risk of Fall With Assistance. Outcome: Progressing   Problem: RH PAIN MANAGEMENT Goal: RH STG PAIN MANAGED AT OR BELOW PT'S PAIN GOAL Outcome:  Progressing   Problem: RH KNOWLEDGE DEFICIT LIMB LOSS Goal: RH STG INCREASE KNOWLEDGE OF SELF CARE AFTER LIMB LOSS Outcome: Progressing

## 2024-02-15 NOTE — Evaluation (Signed)
 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 IV FLUIDS   Diabetes mellitus without complication (HCC)    Disc displacement, lumbar    Full dentures    GERD (gastroesophageal reflux disease)    Hearing deficit    does not wear H/A   History of kidney stones    Hypertension    Kidney stone    Neuromuscular disorder (HCC)    Pneumonia    Umbilical hernia    Wears glasses    Past Surgical History:  Past Surgical History:  Procedure Laterality Date   AMPUTATION Right 01/30/2024   Procedure: Right above the Knee AMPUTATION.;  Surgeon: Pearline Norman RAMAN, MD;  Location: [facility] OR;  Service: Vascular;  Laterality: Right;   BACK SURGERY     CATARACT EXTRACTION W/ INTRAOCULAR LENS  IMPLANT, BILATERAL     COLONOSCOPY W/ BIOPSIES AND POLYPECTOMY     LUMBAR LAMINECTOMY/DECOMPRESSION MICRODISCECTOMY Right 08/30/2016   Procedure: Right lumbar three-four microdiscectomy;  Surgeon: Mavis Purchase, MD;  Location: [facility] OR;  Service: Neurosurgery;  Laterality: Right;   MULTIPLE TOOTH EXTRACTIONS     NOSE SURGERY     deviated septum, and blockage   rottor cuff repair      left     Assessment & Plan Clinical Impression: Patient  . . . is an 89 year old male with history of T2DM, CAD s/p CABG, ICM LVEF -25%, severe PAD, pulmonary HTN- 5 L oxygen dependent, interstitial lung disease on Cellcept,  right foot infection treated with  antibiotics but continued to worsen with gangrenous changes and was transferred from OSH to [facility] for management. He was evaluated by Dr. Pearline and found to have severe tissue loss of entire forefoot with wet and dry gangrene with cellulitis up to mid calf. Comfort care v/s AKA discussed and family elected on surgical intervention. He underwent R-AKA on 01/13 and transferred to ICU due to high oxygen needs.  He completed 24 hours antibiotics post op and diuresed for acute on chronic systolic CHF.    Cardiology consulted for input on  A flutter with RVR and recommended rate control as well as Eliquis  for stroke prevention. 2 D echo done revealing Ef 20-25% with severe LVD, severe reduction in RVF with severe enlargement of RV, severe RA. He was started on IV heparin  and transitioned to Eliquis  and ASA d/c. He developed respiratory distress with tachypnea on 01/17 and started on IV lasix  bid to 40 mg qid for volume overload and medications adjusted. Respiratory status improved but did develop hypotension requiring IVF therefore diuretics d/c as now euvolemic. Leucocytosis being monitored and ABLA stable. Posterior ecchymosis right thigh being monitored. Cellcept on hold with recommendations to resume on outpatient basis.  Has altered sensation in left foot due to neuropathy.  Chronic double vision, has had prior workup. Has prism  glasses that help. PT/OT has been working with patient who requires max assist +2 for standing in [place] with  strong right lean and difficulty extending left/knee trunk    He was independent and ambulatory prior to recent decline. Was using a walker for mobility in home and wife assisting with sponge baths.  Patient transferred to CIR on 02/14/2024 .    Patient currently requires max with basic self-care skills secondary to muscle weakness and muscle joint tightness, decreased cardiorespiratoy endurance, decreased memory and delayed processing, and decreased standing balance.  Prior to  hospitalization, patient could complete BADLs with Max A.   Patient will benefit from skilled intervention to decrease level of assist with basic self-care skills prior to discharge home with care partner.  Anticipate patient will require minimal physical assistance and follow up home health.  OT - End of Session Activity Tolerance: Tolerates < 10 min activity with changes in vital signs Endurance Deficit: Yes OT Assessment Rehab Potential (ACUTE ONLY): Fair OT Barriers to Discharge: Decreased caregiver support;Incontinence;Wound Care;Lack of/limited family support;Weight bearing restrictions OT Patient demonstrates impairments in the following area(s): Balance;Cognition;Endurance;Pain;Safety;Skin Integrity OT Basic ADL's Functional Problem(s): Bathing;Dressing;Toileting OT Transfers Functional Problem(s): Toilet OT Plan OT Intensity: Minimum of 1-2 x/day, 45 to 90 minutes OT Frequency: 5 out of 7 days OT Duration/Estimated Length of Stay: ~2-3 weeks OT Treatment/Interventions: Balance/vestibular training;Cognitive remediation/compensation;Community reintegration;Discharge planning;Disease mangement/prevention;DME/adaptive equipment instruction;Pain management;Patient/family education;Psychosocial support;Self Care/advanced ADL retraining;Skin care/wound managment;Therapeutic Activities;Therapeutic Exercise;UE/LE Strength taining/ROM;Wheelchair propulsion/positioning OT Basic Self-Care Anticipated Outcome(s): Min A OT Toileting Anticipated Outcome(s): Min A OT Bathroom Transfers Anticipated Outcome(s): Min A OT Recommendation Recommendations for Other Services: Therapeutic Recreation consult;Neuropsych consult Therapeutic Recreation Interventions: Stress management Patient destination: Home Follow Up Recommendations: Home health OT Equipment Recommended: To be determined   OT Evaluation Precautions/Restrictions  Precautions Precautions: Fall Precaution/Restrictions Comments: R AKA  NWB; 5L O2 [place] (chronic ~1 yr) Restrictions Weight Bearing Restrictions Per Provider Order: Yes RLE Weight Bearing Per Provider Order: Non weight bearing General Chart Reviewed: Yes Family/Caregiver Present: No Pain Pain Assessment Pain Scale: 0-10 Pain Score: 0-No pain Home Living/Prior Functioning Home Living Family/patient expects to be discharged to:: Private residence Living Arrangements: Spouse/significant other Type of Home: House Home Access: Ramped entrance Home Layout: Able to live on main level with bedroom/bathroom Bathroom Shower/Tub: Sponge bathes at baseline Bathroom Toilet: Handicapped height Bathroom Accessibility: Yes  Lives With: Spouse IADL History Homemaking Responsibilities: No Occupation: Retired Prior Engineer, [place] of [place]: Requires assistive device for independence, Needs assistance with ADLs Bath: Maximal Toileting: Maximal Dressing: Maximal Driving: No Vision Baseline Vision/History: 1 Wears glasses (Baseline double vision in L-eye; prescription glasses to correct.) Ability to See in Adequate Light: 1 Impaired Patient Visual Report: No change from baseline Vision Assessment?: Wears glasses for reading Perception  Perception: Within Functional Limits Praxis Praxis: WFL Cognition Cognition Overall Cognitive Status: No family/caregiver present to determine baseline cognitive functioning Arousal/Alertness: Awake/alert Orientation Level: Person;Place;Situation Memory: Impaired Memory Impairment: Retrieval deficit;Decreased recall of new information;Storage deficit Brief Interview for Mental Status (BIMS) Repetition of Three Words (First Attempt): 3 Temporal Orientation: Year: Correct Temporal Orientation: Month: Accurate within 5 days Temporal Orientation: Day: Correct Recall: Sock: No, could not recall Recall: Blue: Yes, after cueing (a color) Recall: Bed: No, could not recall BIMS Summary Score:  10 Sensation Sensation Light Touch: Impaired Detail Peripheral sensation comments: Reports intermediate phantom limb painl; history of PAD. Coordination Gross Motor Movements are Fluid and Coordinated: No Fine Motor Movements are Fluid and Coordinated: Yes Motor  Motor Motor: Other (comment) Motor - Skilled Clinical Observations: Deconditioned.  Trunk/Postural Assessment  Cervical Assessment Cervical Assessment: Exceptions to  WFL (forward head) Thoracic Assessment Thoracic Assessment: Exceptions to [facility] (rounded shoulders) Lumbar Assessment Lumbar Assessment: Exceptions to [facility] (posterior pelvic tilt) Postural Control Postural Control: Deficits on evaluation (delayed)  Balance Balance Balance Assessed: Yes Static Sitting Balance Static Sitting - Balance Support: Feet supported Static Sitting - Level of Assistance: 5: Stand by assistance Dynamic Sitting Balance Dynamic Sitting - Balance Support: Feet supported Dynamic Sitting - Level of Assistance: 4: Min assist;5: Stand by assistance (CGA-Min A) Extremity/Trunk Assessment RUE Assessment RUE Assessment: Within Functional Limits General Strength Comments: 3-/5 LUE Assessment LUE Assessment: Exceptions to [facility] Active Range of Motion (AROM) Comments: WFL General Strength Comments: 3-/5  Care Tool Care Tool Self Care Eating    Mod I     Oral Care     Mod I     Bathing   Body parts bathed by patient: Right arm;Left arm;Chest;Abdomen;Face;Left upper leg;Left lower leg;Front perineal area;Right upper leg Body parts bathed by helper: Buttocks Body parts n/a: Right lower leg Assist Level: Minimal Assistance - Patient > 75%    Upper Body Dressing(including orthotics)   What is the patient wearing?: Pull over shirt   Assist Level: Minimal Assistance - Patient > 75%    Lower Body Dressing (excluding footwear)   What is the patient wearing?: Ace wrap/stump shrinker Assist for lower body dressing: Maximal Assistance -  Patient 25 - 49%    Putting on/Taking off footwear   What is the patient wearing?: Socks Assist for footwear: Minimal Assistance - Patient > 75%       Care Tool Toileting Toileting activity   Assist for toileting: 2 Helpers     Care Tool Bed Mobility Roll left and right activity        Sit to lying activity        Lying to sitting on side of bed activity         Care Tool Transfers Sit to stand transfer        Chair/bed transfer         Toilet transfer   Assist Level: 2 Helpers     Care Tool Cognition  Expression of Ideas and Wants Expression of Ideas and Wants: 3. Some difficulty - exhibits some difficulty with expressing needs and ideas (e.g, some words or finishing thoughts) or speech is not clear  Understanding Verbal and Non-Verbal Content Understanding Verbal and Non-Verbal Content: 3. Usually understands - understands most conversations, but misses some part/intent of message. Requires cues at times to understand   Memory/Recall Ability Memory/Recall Ability : That he or she is in a hospital/hospital unit   Refer to Care Plan for Long Term Goals  SHORT TERM GOAL WEEK 1 OT Short Term Goal 1 (Week 1): Pt will perform LB hiking with Mod A (x1) + PRN compensatory techniques. OT Short Term Goal 2 (Week 1): Pt will perform toilet transfer with Mod A (x1) + LRAD. OT Short Term Goal 3 (Week 1): Pt will participate in >10 mins of activity with x1 rest break as a means of improving activity tolerance.  Recommendations for other services: Neuropsych and Therapeutic Recreation  Stress management   Skilled Therapeutic Intervention  Session began with introduction to OT role, OT POC, and general orientation to rehab unit/schedule. Pt completes full-body sponge-bathing/dressing with levels of assistance noted below. Pt changes brief/hikes LB garments with Max A (x1) to reach squatted position, as +2 provided dependent care. Pt received/maintained on 5L O2 [place]. Pt remained  sitting in WC with posey  belt activated and immediate needs met.   ADL ADL Eating: Modified independent Where Assessed-Eating: Bed level Grooming: Setup Where Assessed-Grooming: Wheelchair Upper Body Bathing: Setup;Supervision/safety Where Assessed-Upper Body Bathing: Wheelchair Lower Body Bathing: Minimal assistance Where Assessed-Lower Body Bathing: Wheelchair Upper Body Dressing: Minimal assistance Where Assessed-Upper Body Dressing: Wheelchair Lower Body Dressing: Moderate assistance Where Assessed-Lower Body Dressing: Wheelchair Toileting: Maximal assistance Where Assessed-Toileting: Hydrologist: Not assessed Tub/Shower Transfer: Not assessed Film/video Editor: Not assessed  Discharge Criteria: Patient will be discharged from OT if patient refuses treatment 3 consecutive times without medical reason, if treatment goals not met, if there is a change in medical status, if patient makes no progress towards goals or if patient is discharged from hospital.  The above assessment, treatment plan, treatment alternatives and goals were discussed and mutually agreed upon: by patient  Nereida Habermann, OTR/L, MSOT  02/15/2024, 12:59 PM

## 2024-02-15 NOTE — Progress Notes (Signed)
 Physical Therapy Session Note  Patient Details  Name: Reginald [name] MRN: [id number] Date of Birth: 08/08/1935  Today's Date: 02/15/2024 PT Individual Time: 8584-8472 PT Individual Time Calculation (min): 72 min   Short Term Goals: Week 1:     Skilled Therapeutic Interventions/Progress Updates:     Pt semi-reclined in bed upon arrival. Pt denies pain and agreeable to therapy. Nursing present at start of session to administer medications. Session emphasized functional strengthening and endurance/activity tolerance with transfers and WC mobility. Pt sat to EOB with min A. Pt performed slide board transfer EOB to WC toward L with max A. Pt practiced slide board and squat pivot transfers toward R and L WC <> EOM. Pt required max A for both types of transfers toward L and max A +2 for both types of transfers toward R. Pt required ++ time and VC for sequencing. Pt self-propelled WC ~50 ft using B UE x 5 trials with min/mod A for steering with rest breaks in between due to fatigue. Once back in room, pt returned to bed via squat pivot toward R with max A +2. Pt returned to supine with min A. Pt performed R/L rolling to lower pants/brief in order to use urinal with max A. Pt remained supine with all needs in reach at end of session.  Therapy Documentation Precautions:  Precautions Precautions: Fall Precaution/Restrictions Comments: R AKA NWB; 5L O2 [place] (chronic ~1 yr) Restrictions Weight Bearing Restrictions Per Provider Order: Yes RLE Weight Bearing Per Provider Order: Non weight bearing  Therapy/Group: Individual Therapy  Comer CHRISTELLA Levora Comer Levora, PT, DPT 02/15/2024, 12:54 PM

## 2024-02-15 NOTE — Progress Notes (Addendum)
 Initial Nutrition Assessment  DOCUMENTATION CODES:   Not applicable  INTERVENTION:  Encouraged adequate intake of meals and supplements to promote wound healing  Continue Ensure Max BID to help pt meet protein needs; each supplement provides 150 kcal and 30 grams of protein.   Continue Juven BID; each packet provides 95 calories, 2.5 grams of protein (collagen), and 9.8 grams of carbohydrate (3 grams sugar); also contains 7 grams of L-arginine and L-glutamine, 300 mg vitamin C , 15 mg vitamin E, 1.2 mcg vitamin B-12, 9.5 mg zinc , 200 mg calcium , and 1.5 g  Calcium  Beta-hydroxy-Beta-methylbutyrate to support wound healing  Added Magic cup TID with meals, each supplement provides 290 kcal and 9 grams of protein  Liberalized diet to carb modified to continue to monitor glucose control but provide increased protein options as heart healthy limits protein options available  NUTRITION DIAGNOSIS:   Increased nutrient needs related to wound healing, post-op healing as evidenced by estimated needs.  GOAL:   Patient will meet greater than or equal to 90% of their needs  MONITOR:   PO intake, Supplement acceptance  REASON FOR ASSESSMENT:   Consult Wound healing, Diet education  ASSESSMENT:   Pt with hx of diabetes, CAD s/p CABG, PAD, HTN on 5L O2. Recent admission with worsenign R foot infection and cellulitis to mid calf. Family elected for R AKA 1/13 and pt admitted to CIR to work on functional decline.  Pt independent using walker at baseline for mobility.   Pt resting in WC at time of assessment. Pt reports good appetite, ate 100% of breakfast this morning. Pt reports no GI discomforts at this time, having regular bowel movements. Discussed liberalizing diet to provide increased protein options for pt and educated on importance of protein intake to support wound healing. Pt agreeable to continue Ensure Max and Juven although he likes Juven more than Ensure.  PTA, pt reports he had a  good appetite, would eat 2-3 x per day. Pt lives with wife who would help with meals. Pt reports he did not eat much meat due to meat being expensive, but he would try to eat it once per day. Pt states he would eat eggs/yogurt for protein intake but otherwise relied on small meals.   Weight appears stable. Weight prior to AKA around 79.8 kg and now 74.5 kg which is suspected with limb loss. Nutrition focused physical exam shows mild to moderate muscle depletions, likely related to atrophy from decreased physical activity with aging. Suspect pt well nourished at baseline but will continue to monitor.   Educated on wound healing and importance of nutrition to assist wound healing. Discussed importance of tight glucose control for proper healing and encouraged pt to accept ONS.    Average Meal Completion: 1/29: 100% average intake x 1 recorded meals  Nutritionally Relevant Medications: Vitamin C  500 mg daily Vitamin D3 2000 units daily SSI 0-5 at bedtime SSI 0-9 units TID MVI w/minerals Juven BID Protonix  Miralax  Prednisone  Senna   Labs reviewed: CBG x 24 h: 86-216 mg/dL J8r 6.8  Admit weight: 74.5 kg  NUTRITION - FOCUSED PHYSICAL EXAM:  Flowsheet Row Most Recent Value  Orbital Region No depletion  Upper Arm Region No depletion  Thoracic and Lumbar Region No depletion  Buccal Region Mild depletion  Temple Region Mild depletion  Clavicle Bone Region Mild depletion  Clavicle and Acromion Bone Region Mild depletion  Scapular Bone Region Mild depletion  Dorsal Hand No depletion  Patellar Region Moderate depletion  [place] assessed  L]  Anterior Thigh Region Moderate depletion  [only assessed L]  Posterior Calf Region Moderate depletion  [only assessed L]  Edema (RD Assessment) None  Hair Reviewed  Eyes Reviewed  Mouth Reviewed  Skin Reviewed  Nails Reviewed     Diet Order:   Diet Order             Diet Carb Modified Room service appropriate? Yes with Assist  Diet  effective now                   EDUCATION NEEDS:   Education needs have been addressed  Skin:  Skin Assessment: Skin Integrity Issues: Skin Integrity Issues:: Incisions, Other (Comment) Incisions: R AKA Other: ulcer L foot  Last BM:  1/28  Height:   Ht Readings from Last 1 Encounters:  02/15/24 5' 8 (1.727 m)    Weight:   Wt Readings from Last 1 Encounters:  02/15/24 74.5 kg    Ideal Body Weight:  63 kg **adjusted for R AKA**  BMI:  Body mass index is 24.96 kg/m.  Estimated Nutritional Needs:   Kcal:  1600-1800  Protein:  110-120g  Fluid:  1.6-1.8L    Josette Glance, MS, RDN, LDN Clinical Dietitian I Please reach out via secure chat

## 2024-02-15 NOTE — Progress Notes (Signed)
 Inpatient Rehabilitation  Patient information reviewed and entered into eRehab system by [facility]. Karen Kays., CCC/SLP, PPS Coordinator.  Information including medical coding, functional ability and quality indicators will be reviewed and updated through discharge.

## 2024-02-15 NOTE — Progress Notes (Signed)
 Patient ID: Reginald [name], male   DOB: May 21, 1935, 89 y.o.   MRN: [id number] Met with the patient to review current medical situation, rehab process, team conference and plan of care. Discussed shrinker on AKA with little phantom pain. Reviewed skin care recommendations for opposing limb. Discussed management of secondary risks including HTN, HLD, DM (A1C 6.8), HF/ILD, PAD/CAD on Eliquis .  Continue to follow along to address educational needs to facilitate preparation for discharge. Fredericka Barnie NOVAK

## 2024-02-15 NOTE — Progress Notes (Signed)
 "                                                        PROGRESS NOTE   Subjective/Complaints: No new complaints this morning Has some residual limb pain but it is well controlled on current regimen Had BM yesterday  ROS: pain is well controlled   Objective:   No results found. Recent Labs    02/15/24 0429  WBC 11.8*  HGB 11.4*  HCT 38.7*  PLT 379   Recent Labs    02/15/24 0429  NA 135  K 4.8  CL 100  CO2 25  GLUCOSE 84  BUN 22  CREATININE 0.63  CALCIUM  9.0    Intake/Output Summary (Last 24 hours) at 02/15/2024 9072 Last data filed at 02/15/2024 0700 Gross per 24 hour  Intake 354 ml  Output 400 ml  Net -46 ml        Physical Exam: Vital Signs Blood pressure (!) 143/79, pulse 75, temperature (!) 97.5 F (36.4 C), temperature source Oral, resp. rate 16, height 5' 8 (1.727 m), weight 74.5 kg, SpO2 100%. Gen: no distress, normal appearing HEENT: oral mucosa pink and moist, NCAT, [place] in place Cardio: Reg rate Chest: normal effort, normal rate of breathing Abd: soft, non-distended Ext: no edema Psych: pleasant, normal affect Skin: R AKA incision c/d/i Neuro: Aox3, b/l UE with 4/5 strength, RLE with 4/5 strength, LLE with 4/5 strength except for 2/5 DF/PF   Assessment/Plan: 1. Functional deficits which require 3+ hours per day of interdisciplinary therapy in a comprehensive inpatient rehab setting. Physiatrist is providing close team supervision and 24 hour management of active medical problems listed below. Physiatrist and rehab team continue to assess barriers to discharge/monitor patient progress toward functional and medical goals  Care Tool:  Bathing              Bathing assist       Upper Body Dressing/Undressing Upper body dressing        Upper body assist      Lower Body Dressing/Undressing Lower body dressing            Lower body assist       Toileting Toileting    Toileting assist       Transfers Chair/bed  transfer  Transfers assist           Locomotion Ambulation   Ambulation assist              Walk 10 feet activity   Assist           Walk 50 feet activity   Assist           Walk 150 feet activity   Assist           Walk 10 feet on uneven surface  activity   Assist           Wheelchair     Assist               Wheelchair 50 feet with 2 turns activity    Assist            Wheelchair 150 feet activity     Assist          Blood pressure (!) 143/79, pulse 75, temperature (!) 97.5 F (36.4 C), temperature  source Oral, resp. rate 16, height 5' 8 (1.727 m), weight 74.5 kg, SpO2 100%.  Medical Problem List and Plan: 1. 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  New onset afib/aflutter: continue Eliquis   3. Pain Management: Ultram  prn 4. Mood/Behavior/Sleep: LCSW to follow for evaluation and support.              -antipsychotic agents: N/A 5. Neuropsych/cognition: This patient is capable of making decisions on his own behalf. 6. Skin/Wound Care: Pressure relief measures.  --Will order prevalon boot for  left is boggy. --pain pressure ares on left later foot and toes with betadine .   R- BKA                        Left lateral heel          Left toes.       7. Fluids/Electrolytes/Nutrition: Strict I/O. Continue Vitamin C .              --add protein supplement.  9. R-AKA due to PAD/gangrene/ischemia: Has appt for staple removal 02/12 10.  Hypoalbuminemia: continue protein supplement  11.  T2DM with neuropathy: Monitor BS ac/hs and use SSI for elevated BS             --BS have been labile form 68- 216. Will continue to hold metformin . Provided list of foods for diabetes 12. CAD s/p CABG: On Lipitor, Cozaar ,  metoprolol  and Eliquis (off ASA) 13. Chronic systolic CHF/ICM w/EF: Monitor daily weight and for signs of overload.  --Strict I/O. Demadex  d/c due to hypotension. --On Lipitor, Cozaar , Eliquis . Continue to hold jardiance to promote wound healing.  Weight reviewed and is stable  14.  Interstitial lung disease w/chronic hypoxic RF: Recommendations to resume Cellcept after dc. Continue Prednisone  30 mg and Bactrim  DS 3 X /wk             --Dependent of 5 L oxygen per [place] at baseline. Continue  15. Leucocytosis: WBC reviewed and is improving  16. Pre-renal azotemia: Improving off Demadex .  17. Anemia of chronic disease: Has been treating right foot infection since last June             --Hgb stable at 10.5- 11.4  18. GERD: PPI    LOS: 1 days A FACE TO FACE EVALUATION WAS PERFORMED  Reginald [name] Reginald [name] 02/15/2024, 9:27 AM     "

## 2024-02-15 NOTE — Progress Notes (Signed)
 Inpatient Rehabilitation Admission Medication Review by a Pharmacist  A complete drug regimen review was completed for this patient to identify any potential clinically significant medication issues.  High Risk Drug Classes Is patient taking? Indication by Medication  Antipsychotic No   Anticoagulant Yes Apixaban : AFib  Antibiotic Yes Bactrim : ILD w/chronic hypoxic RF   Opioid Yes Tramadol : pain  Antiplatelet No   Hypoglycemics/insulin  Yes SSI: hyperglycemia  Vasoactive Medication Yes Losartan , metoprolol : CAD/HF  Chemotherapy No   Other Yes Brovana , revefenacin , Prednisone : ILD w/chronic hypoxic RF Lipitor: HLD VitC, VitD, MVI: supplements/vitamins Gabapentin : pain Fenofibrate : HLD Protonix : GERD Lorazepam : anxiety Compazine : N/V Robitussin: cough Melatonin: sleep     Type of Medication Issue Identified Description of Issue Recommendation(s)  Drug Interaction(s) (clinically significant)     Duplicate Therapy     Allergy     No Medication Administration End Date     Incorrect Dose     Additional Drug Therapy Needed     Significant med changes from prior encounter (inform family/care partners about these prior to discharge).  Discontinued/held meds: aspirin , Metformin , Jardiance, Demadex  Restart or discontinue as appropriate. Communicate medication changes with patient/family at discharge  Other  Resume Cellcept on discharge     Clinically significant medication issues were identified that warrant physician communication and completion of prescribed/recommended actions by midnight of the next day:  No  Time spent performing this drug regimen review (minutes): 30  Thank you for allowing pharmacy to be a part of this patients care.  Reginald [name], PharmD 02/15/2024 11:12 AM   **Pharmacist phone directory can be found on [email or web address] listed under [facility]**

## 2024-02-16 LAB — GLUCOSE, CAPILLARY
Glucose-Capillary: 96 mg/dL (ref 70–99)
Glucose-Capillary: 141 mg/dL — ABNORMAL HIGH (ref 70–99)
Glucose-Capillary: 246 mg/dL — ABNORMAL HIGH (ref 70–99)
Glucose-Capillary: 199 mg/dL — ABNORMAL HIGH (ref 70–99)

## 2024-02-16 NOTE — Plan of Care (Signed)
" °  Problem: Consults Goal: RH LIMB LOSS PATIENT EDUCATION Description: Description: See Patient Education module for eduction specifics. Outcome: Progressing Goal: Skin Care Protocol Initiated - if Braden Score 18 or less Description: If consults are not indicated, leave blank or document N/A Outcome: Progressing Goal: Nutrition Consult-if indicated Outcome: Progressing Goal: Diabetes Guidelines if Diabetic/Glucose > 140 Description: If diabetic or lab glucose is > 140 mg/dl - Initiate Diabetes/Hyperglycemia Guidelines & Document Interventions  Outcome: Progressing   Problem: RH BOWEL ELIMINATION Goal: RH STG MANAGE BOWEL WITH ASSISTANCE Description: STG Manage Bowel with Assistance. Outcome: Progressing Goal: RH STG MANAGE BOWEL W/MEDICATION W/ASSISTANCE Description: STG Manage Bowel with Medication with Assistance. Outcome: Progressing   Problem: RH BLADDER ELIMINATION Goal: RH STG MANAGE BLADDER WITH ASSISTANCE Description: STG Manage Bladder With Assistance Outcome: Progressing   Problem: RH SKIN INTEGRITY Goal: RH STG MAINTAIN SKIN INTEGRITY WITH ASSISTANCE Description: STG Maintain Skin Integrity With Assistance. Outcome: Progressing Goal: RH STG ABLE TO PERFORM INCISION/WOUND CARE W/ASSISTANCE Description: STG Able To Perform Incision/Wound Care With Assistance. Outcome: Progressing   Problem: RH SAFETY Goal: RH STG ADHERE TO SAFETY PRECAUTIONS W/ASSISTANCE/DEVICE Description: STG Adhere to Safety Precautions With Assistance/Device. Outcome: Progressing Goal: RH STG DECREASED RISK OF FALL WITH ASSISTANCE Description: STG Decreased Risk of Fall With Assistance. Outcome: Progressing   Problem: RH PAIN MANAGEMENT Goal: RH STG PAIN MANAGED AT OR BELOW PT'S PAIN GOAL Outcome: Progressing   Problem: RH KNOWLEDGE DEFICIT LIMB LOSS Goal: RH STG INCREASE KNOWLEDGE OF SELF CARE AFTER LIMB LOSS Outcome: Progressing   "

## 2024-02-16 NOTE — Progress Notes (Signed)
 Occupational Therapy Session Note  Patient Details  Name: Reginald [name] MRN: [id number] Date of Birth: 1935-08-02  Today's Date: 02/16/2024 OT Individual Time: 9098-9053 OT Individual Time Calculation (min): 45 min    Short Term Goals: Week 1:  OT Short Term Goal 1 (Week 1): Pt will perform LB hiking with Mod A (x1) + PRN compensatory techniques. OT Short Term Goal 2 (Week 1): Pt will perform toilet transfer with Mod A (x1) + LRAD. OT Short Term Goal 3 (Week 1): Pt will participate in >10 mins of activity with x1 rest break as a means of improving activity tolerance.  Skilled Therapeutic Interventions/Progress Updates:  Pt greeted seated in w/c, pt agreeable to OT intervention.      Transfers/bed mobility/functional mobility:  Practiced blocked practice of slide board transfers from w/c<>EOM with total A to place board but only MIN A +2 for safety to stabilize the board. Placed step under L foot to allow for increased ability to push through LLE during transfer.  Additionally practiced boosting butt up from w/c to practice pressure relief strategies however pt with minimal ability to elevate [place] off of chair.     Exercises: pt completed below BUE therex with 2lb weighted dowel:  2X5 chest presses, pt needed + rest breaks d/t fatigue, pt on 6L [place] with SpO2 WFL during session  Pt completed interval workout where pt was instructed to use 2lb weighted dowel to toss ball back and forth. Pt only able to tolerate 3x5 reps with 30 sec rest break in between d/t decreased activity tolerance.     Cognition:               Pt reports he is going back to boone [place] when he leaves the hospital, unsure of the validity of this statement.   Ended session with pt seated in w/c with all needs within reach and NT present.        Therapy Documentation Precautions:  Precautions Precautions: Fall Precaution/Restrictions Comments: R AKA NWB; 5L O2 [place] (chronic ~1 yr) Restrictions Weight Bearing  Restrictions Per Provider Order: Yes RLE Weight Bearing Per Provider Order: Non weight bearing  Pain: Unrated pain reported in R AKA, rest breaks provided as needed.    Therapy/Group: Individual Therapy  [name] [facility] 02/16/2024, 12:06 PM

## 2024-02-16 NOTE — Progress Notes (Signed)
 Physical Therapy Session Note  Patient Details  Name: Reginald [name] MRN: [id number] Date of Birth: 06-16-1935  Today's Date: 02/16/2024 PT Individual Time: 1418-1530 PT Individual Time Calculation (min): 72 min   Short Term Goals: Week 1:  PT Short Term Goal 1 (Week 1): Pt will perform bed mobility with CGA. PT Short Term Goal 2 (Week 1): Pt will complete sit to stand with modA. PT Short Term Goal 3 (Week 1): Pt will complete bed to chair with modA consistently. PT Short Term Goal 4 (Week 1): Pt will self propel WC x100' wiht BUEs and minA.  Skilled Therapeutic Interventions/Progress Updates: Pt presents supie in bed and agreeable to therapy.  Pt transfers sup to sit w/ min A and then scoots to EOB w/ cues.  W/c placed to L and pt performs squat pivot transfer w/ mod A and cues for advancement.   Pt wheeled to dayroom for energy conservation.  Pt performed w/c push-ups 3 x 5 w/ cues for forward lean and breathing technique.  HR increased to 146, nursing aware, but O2 sats remained 96% or > w/ 5 LPM. Pt performed squat pivot to L to mat table w/ min w/ slightly downhill bias.  Pt able to clear bottom 70% of time for transfers.  Pt performed beach ball volleyball x 3 trials w/ 1# weighted bar.  Pt returned to w/c w/ uphill bias and mod A to clear cushion.  Pt transferred to bed to R w/ squat pivot and mod A.  Pt able to transfer sit to supine w/ min A and then scooted to [facility] w/ + 2.  Pt handed off to nursing.     Therapy Documentation Precautions:  Precautions Precautions: Fall Precaution/Restrictions Comments: R AKA NWB; 5L O2 [place] (chronic ~1 yr) Restrictions Weight Bearing Restrictions Per Provider Order: Yes RLE Weight Bearing Per Provider Order: Non weight bearing General:   Vital Signs: Therapy Vitals Temp: 97.9 F (36.6 C) Pulse Rate: 71 Resp: 20 BP: 104/64 Patient Position (if appropriate): Lying Oxygen Therapy SpO2: 94 % O2 Device: Nasal Cannula Pain:0/10      Therapy/Group: Individual Therapy  Majed Pellegrin P Jesstin Studstill 02/16/2024, 3:56 PM

## 2024-02-16 NOTE — Progress Notes (Signed)
 Patient ID: Reginald [name], male   DOB: 1935/10/16, 89 y.o.   MRN: [id number]  Statement of service delivered.

## 2024-02-16 NOTE — Progress Notes (Signed)
 Inpatient Rehabilitation Center Individual Statement of Services  Patient Name:  Reginald [name]  Date:  02/16/2024  Welcome to the Inpatient Rehabilitation Center.  Our goal is to provide you with an individualized program based on your diagnosis and situation, designed to meet your specific needs.  With this comprehensive rehabilitation program, you will be expected to participate in at least 3 hours of rehabilitation therapies Monday-Friday, with modified therapy programming on the weekends.  Your rehabilitation program will include the following services:  Physical Therapy (PT), Occupational Therapy (OT), Speech Therapy (ST), 24 hour per day rehabilitation nursing, Therapeutic Recreaction (TR), Neuropsychology, Care Coordinator, Rehabilitation Medicine, Nutrition Services, and Pharmacy Services  Weekly team conferences will be held on Wednesday to discuss your progress.  Your Inpatient Rehabilitation Care Coordinator will talk with you frequently to get your input and to update you on team discussions.  Team conferences with you and your family in attendance may also be held.  Expected length of stay: 18-21 days  Overall anticipated outcome: Supervision/Verbal cueing to [name]  Depending on your progress and recovery, your program may change. Your Inpatient Rehabilitation Care Coordinator will coordinate services and will keep you informed of any changes. Your Inpatient Rehabilitation Care Coordinator's name and contact numbers are listed  below.  The following services may also be recommended but are not provided by the Inpatient Rehabilitation Center:  Driving Evaluations Home Health Rehabiltiation Services Outpatient Rehabilitation Services Vocational Rehabilitation   Arrangements will be made to provide these services after discharge if needed.  Arrangements include referral to agencies that provide these services.  Your insurance has been verified to be:  MEDICARE / MEDICARE PART  A AND BLUE CROSS BLUE SHIELD/BCBS/FEDERAL EMP PPO  Your primary doctor is:  None  Pertinent information will be shared with your doctor and your insurance company.  Inpatient Rehabilitation Care Coordinator:  [name] [phone] or [name]  Information discussed with and copy given to patient by: [name], 02/16/2024, 11:11 AM

## 2024-02-16 NOTE — Progress Notes (Addendum)
 Patient ID: Reginald [name], male   DOB: May 29, 1935, 89 y.o.   MRN: [id number]  Verified with Alisa that the patient will more than likely discharge home rather than with his granddaughter.   [facility].   Alisa inquired about DME - SW informed her that we will await to therapy recommendations.

## 2024-02-16 NOTE — Progress Notes (Signed)
 Orthopedic Tech Progress Note Patient Details:  Reginald [name] 14-Oct-1935 [id number] Called in order for Retention Sock  Patient ID: Reginald [name], male   DOB: April 24, 1935, 89 y.o.   MRN: [id number]  [name] 02/16/2024, 12:24 PM

## 2024-02-17 LAB — GLUCOSE, CAPILLARY
Glucose-Capillary: 117 mg/dL — ABNORMAL HIGH (ref 70–99)
Glucose-Capillary: 145 mg/dL — ABNORMAL HIGH (ref 70–99)
Glucose-Capillary: 188 mg/dL — ABNORMAL HIGH (ref 70–99)
Glucose-Capillary: 189 mg/dL — ABNORMAL HIGH (ref 70–99)

## 2024-02-17 MED ORDER — TORSEMIDE 20 MG PO TABS
10.0000 mg | ORAL_TABLET | Freq: Every day | ORAL | Status: DC
  Administered 2024-02-17 – 2024-02-19 (×3): 10 mg via ORAL
  Filled 2024-02-17 (×3): qty 1

## 2024-02-17 MED ORDER — MELATONIN 3 MG PO TABS
1.5000 mg | ORAL_TABLET | Freq: Every day | ORAL | Status: DC
  Administered 2024-02-17 – 2024-02-21 (×5): 1.5 mg via ORAL
  Filled 2024-02-17 (×5): qty 1

## 2024-02-17 MED ORDER — METFORMIN HCL 500 MG PO TABS
500.0000 mg | ORAL_TABLET | Freq: Every day | ORAL | Status: AC
  Administered 2024-02-18 – 2024-02-23 (×6): 500 mg via ORAL
  Filled 2024-02-17 (×6): qty 1

## 2024-02-17 NOTE — Progress Notes (Signed)
 Physical Therapy Session Note  Patient Details  Name: Reginald [name] MRN: [id number] Date of Birth: 03/26/1935  Today's Date: 02/17/2024 PT Individual Time: 0816-0926 PT Individual Time Calculation (min): 70 min   Short Term Goals: Week 1:  PT Short Term Goal 1 (Week 1): Pt will perform bed mobility with CGA. PT Short Term Goal 2 (Week 1): Pt will complete sit to stand with modA. PT Short Term Goal 3 (Week 1): Pt will complete bed to chair with modA consistently. PT Short Term Goal 4 (Week 1): Pt will self propel WC x100' wiht BUEs and minA.  Skilled Therapeutic Interventions/Progress Updates:   Received pt semi-reclined in bed receiving breathing treatment. RN notified and present to stop breathing treatment. Pt reported feeling confused about sleep/wake schedules and day vs night time recently. Explained that this is common in hospital but encouraged participation in therapy during day to sleep better at night. Pt politely refused OOB mobility or going to gym stating I've been doing that and I really don't feel like it today. Explained that current session may be his only therapy today but pt continue to refuse but was agreeable to limb loss education. Pt reported pain 8/10 in R residual limb (premedicated).   Provided pt with/discussed the following limb loss resources: -shrinker care guidelines -contracture prevention -general rehab/prosthetic timeline -questions to prepare for prosthetic evaluation with [employer] -amputee support group of the [place] -balanced amputee brochure -peer support program -amputeecoalition.org  Provided pt with HEP and educated on frequency/duration/technique for the following exercises: -WC push ups 3x10 -sidelying hip AROM 3x10 -prone hip extension 3x10  -supine hip flexion 3x10 -supine hamstring stretch 3x30 second hold -supine hip abduction 3x10 -prone press ups 10x10 second hold -prone hip extension 3x10 -sidelying hip abduction 3x10 -single leg  bridge 3x10  Noted pt only had retention sock, no shrinker - will discuss with primary PT ordering pt one. Donned retention sock and doubled up, but still too loose. With maximal encouragement, pt agreed to some bed level exercises and performed the following exercises with emphasis on LE strength/ROM/contracture prevention: -R hip flexion 2x10 -R hip abduction 2x10 -R hamstring stretch 3x30 second hold -L single leg bridge 2x10 (max cues for technique and unable to clear hips)  Pt reported urgent need to toilet - before therapist could retrieve bedpan, pt soiled brief/sheets - removed soiled brief, placed bedpan, and pt continued to void. Removed soiled linens and sacral dressing and placed clean linens and donned clean brief with max A. Pt able to roll L/R with supervision using bedrails. Scooted to [facility] using Trendelenburg bed position with pt pulling on headboard and placed pillow under LLE to float heel. Concluded session with pt semi-reclined in bed, needs within reach, and bed alarm on.   Therapy Documentation Precautions:  Precautions Precautions: Fall Precaution/Restrictions Comments: R AKA NWB; 5L O2 [place] (chronic ~1 yr) Restrictions Weight Bearing Restrictions Per Provider Order: Yes RLE Weight Bearing Per Provider Order: Non weight bearing  Therapy/Group: Individual Therapy [name] [name] PT, DPT 02/17/2024, 6:47 AM

## 2024-02-17 NOTE — Plan of Care (Signed)
" °  Problem: Consults Goal: RH LIMB LOSS PATIENT EDUCATION Description: Description: See Patient Education module for eduction specifics. Outcome: Progressing Goal: Skin Care Protocol Initiated - if Braden Score 18 or less Description: If consults are not indicated, leave blank or document N/A Outcome: Progressing Goal: Nutrition Consult-if indicated Outcome: Progressing Goal: Diabetes Guidelines if Diabetic/Glucose > 140 Description: If diabetic or lab glucose is > 140 mg/dl - Initiate Diabetes/Hyperglycemia Guidelines & Document Interventions  Outcome: Progressing   Problem: RH BOWEL ELIMINATION Goal: RH STG MANAGE BOWEL WITH ASSISTANCE Description: STG Manage Bowel with Assistance. Outcome: Progressing Goal: RH STG MANAGE BOWEL W/MEDICATION W/ASSISTANCE Description: STG Manage Bowel with Medication with Assistance. Outcome: Progressing   Problem: RH BLADDER ELIMINATION Goal: RH STG MANAGE BLADDER WITH ASSISTANCE Description: STG Manage Bladder With Assistance Outcome: Progressing   Problem: RH SKIN INTEGRITY Goal: RH STG MAINTAIN SKIN INTEGRITY WITH ASSISTANCE Description: STG Maintain Skin Integrity With Assistance. Outcome: Progressing Goal: RH STG ABLE TO PERFORM INCISION/WOUND CARE W/ASSISTANCE Description: STG Able To Perform Incision/Wound Care With Assistance. Outcome: Progressing   Problem: RH SAFETY Goal: RH STG ADHERE TO SAFETY PRECAUTIONS W/ASSISTANCE/DEVICE Description: STG Adhere to Safety Precautions With Assistance/Device. Outcome: Progressing Goal: RH STG DECREASED RISK OF FALL WITH ASSISTANCE Description: STG Decreased Risk of Fall With Assistance. Outcome: Progressing   Problem: RH PAIN MANAGEMENT Goal: RH STG PAIN MANAGED AT OR BELOW PT'S PAIN GOAL Outcome: Progressing   Problem: RH KNOWLEDGE DEFICIT LIMB LOSS Goal: RH STG INCREASE KNOWLEDGE OF SELF CARE AFTER LIMB LOSS Outcome: Progressing   "

## 2024-02-17 NOTE — Progress Notes (Signed)
 "                                                        PROGRESS NOTE   Subjective/Complaints: No new complaints this morning Tolerated therapy this morning Reported to Therisa that he feels circadian rhythm is disrupted   ROS: pain is well controlled, breathing well, +circadian rhythm dysfunction   Objective:   No results found. Recent Labs    02/15/24 0429  WBC 11.8*  HGB 11.4*  HCT 38.7*  PLT 379   Recent Labs    02/15/24 0429  NA 135  K 4.8  CL 100  CO2 25  GLUCOSE 84  BUN 22  CREATININE 0.63  CALCIUM  9.0    Intake/Output Summary (Last 24 hours) at 02/17/2024 1205 Last data filed at 02/17/2024 0815 Gross per 24 hour  Intake 476 ml  Output 500 ml  Net -24 ml        Physical Exam: Vital Signs Blood pressure 126/84, pulse 73, temperature 98.1 F (36.7 C), temperature source Oral, resp. rate 16, height 5' 8 (1.727 m), weight 77 kg, SpO2 99%. Gen: no distress, normal appearing HEENT: oral mucosa pink and moist, NCAT, [place] in place Cardio: Reg rate Chest: normal effort, normal rate of breathing Abd: soft, non-distended Ext: no edema Psych: pleasant, normal affect Skin: R AKA incision c/d/i Neuro: Aox3, b/l UE with 4/5 strength, RLE with 4/5 strength, LLE with 4/5 strength except for 2/5 DF/PF, stable 1/31   Assessment/Plan: 1. Functional deficits which require 3+ hours per day of interdisciplinary therapy in a comprehensive inpatient rehab setting. Physiatrist is providing close team supervision and 24 hour management of active medical problems listed below. Physiatrist and rehab team continue to assess barriers to discharge/monitor patient progress toward functional and medical goals  Care Tool:  Bathing    Body parts bathed by patient: Right arm, Left arm, Chest, Abdomen, Face, Left upper leg, Left lower leg, Front perineal area, Right upper leg   Body parts bathed by helper: Buttocks Body parts n/a: Right lower leg   Bathing assist Assist Level:  Minimal Assistance - Patient > 75%     Upper Body Dressing/Undressing Upper body dressing   What is the patient wearing?: Pull over shirt    Upper body assist Assist Level: Minimal Assistance - Patient > 75%    Lower Body Dressing/Undressing Lower body dressing      What is the patient wearing?: Pants     Lower body assist Assist for lower body dressing: Maximal Assistance - Patient 25 - 49%     Toileting Toileting    Toileting assist Assist for toileting: Total Assistance - Patient < 25%     Transfers Chair/bed transfer  Transfers assist     Chair/bed transfer assist level: Moderate Assistance - Patient 50 - 74%     Locomotion Ambulation   Ambulation assist   Ambulation activity did not occur: Safety/medical concerns          Walk 10 feet activity   Assist  Walk 10 feet activity did not occur: Safety/medical concerns        Walk 50 feet activity   Assist Walk 50 feet with 2 turns activity did not occur: Safety/medical concerns         Walk 150 feet activity   Assist  Walk 150 feet activity did not occur: Safety/medical concerns         Walk 10 feet on uneven surface  activity   Assist Walk 10 feet on uneven surfaces activity did not occur: Safety/medical concerns         Wheelchair     Assist Is the patient using a wheelchair?: Yes Type of Wheelchair: Manual    Wheelchair assist level: Minimal Assistance - Patient > 75% Max wheelchair distance: 37'    Wheelchair 50 feet with 2 turns activity    Assist        Assist Level: Minimal Assistance - Patient > 75%   Wheelchair 150 feet activity     Assist      Assist Level: Maximal Assistance - Patient 25 - 49%   Blood pressure 126/84, pulse 73, temperature 98.1 F (36.7 C), temperature source Oral, resp. rate 16, height 5' 8 (1.727 m), weight 77 kg, SpO2 99%.  Medical Problem List and Plan: 1. 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  New onset afib/aflutter: continue Eliquis   3. Pain Management: continue Ultram  prn  4. Circadian rhythm dysfunction: melatonin 1.5mg  scheduled HS. LCSW to follow for evaluation and support.   5. Neuropsych/cognition: This patient is capable of making decisions on his own behalf. 6. Skin/Wound Care: Pressure relief measures.  --Will order prevalon boot for  left is boggy. --pain pressure ares on left later foot and toes with betadine .   R- BKA                        Left lateral heel          Left toes.       7. Fluids/Electrolytes/Nutrition: Strict I/O. Continue Vitamin C .              --add protein supplement.   9. R-AKA due to PAD/gangrene/ischemia: Has appt for staple removal 02/12  10.  Hypoalbuminemia: continue protein supplement  11.  T2DM with neuropathy: Monitor BS ac/hs and use SSI for elevated BS             Restart metformin  500mg  daily. Provided list of foods for diabetes  12. CAD s/p CABG: continue Lipitor, metoprolol  and Eliquis (off ASA)  13. Chronic systolic CHF/ICM w/EF: Monitor daily weight and for signs of overload.  --Strict I/O. --Continue Lipitor, Cozaar , Eliquis . Continue to hold jardiance to promote wound healing.  Weight reviewed and is increased. Restart Demadex - this was held previously due to hypotension- will d/c cozaar  to prevent hypotension while restarting Demadex   14.  Interstitial lung disease w/chronic hypoxic RF: Recommendations to resume Cellcept after dc. continue Prednisone  30 mg and Bactrim  DS 3 X /wk             --Dependent of 5 L oxygen per [place] at baseline- continue  15. Leucocytosis: WBC reviewed and is improving  16. Pre-renal azotemia: Improving off Demadex .   17. Anemia of chronic disease: Has been treating right foot infection since  last June             --Hgb stable at 10.5-  11.4   18. GERD: ccontinue PPI     LOS: 3 days A FACE TO FACE EVALUATION WAS PERFORMED  Sven P Hanley Rispoli 02/17/2024, 12:05 PM     "

## 2024-02-17 NOTE — IPOC Note (Signed)
 Overall Plan of Care [facility]) Patient Details Name: Reginald [name] MRN: [id number] DOB: 1935-04-01  Admitting Diagnosis: Above-knee amputation of right lower extremity [facility])  Hospital Problems: Principal Problem:   Above-knee amputation of right lower extremity (HCC)     Functional Problem List: Nursing Bladder, Bowel, Edema, Endurance, Medication Management, Pain, Safety, Skin Integrity  PT Balance, Endurance, Motor, Pain, Safety, Sensory, Skin Integrity  OT Balance, Cognition, Endurance, Pain, Safety, Skin Integrity  SLP    TR         Basic ADLs: OT Bathing, Dressing, Toileting     Advanced  ADLs: OT       Transfers: PT Bed Mobility, Bed to Chair, It Sales Professional: PT Ambulation, Psychologist, [employer], Stairs     Additional Impairments: OT    SLP        TR      Anticipated Outcomes Item Anticipated Outcome  Self Feeding    Swallowing      Basic self-care  Min A  Toileting  Min A   Bathroom Transfers Min A  Bowel/Bladder  Regain continence of bowel/bladder with min assist  Transfers  MinA  Locomotion  Supervision WC level  Communication     Cognition     Pain  <3 on a 0-10 pain scale  Safety/Judgment  No falls on rehab with min assist   Therapy Plan: PT Intensity: Minimum of 1-2 x/day ,45 to 90 minutes PT Frequency: 5 out of 7 days PT Duration Estimated Length of Stay: 2-3 Weeks OT Intensity: Minimum of 1-2 x/day, 45 to 90 minutes OT Frequency: 5 out of 7 days OT Duration/Estimated Length of Stay: ~2-3 weeks     Team Interventions: Nursing Interventions Patient/Family Education, Pain Management, Bladder Management, Medication Management, Discharge Planning, Bowel Management, Skin Care/Wound Management, Disease Management/Prevention  PT interventions Community reintegration, Ambulation/gait training, DME/adaptive equipment instruction, Neuromuscular re-education, Psychosocial support, Stair training, UE/LE Strength taining/ROM,  Wheelchair propulsion/positioning, Warden/ranger, Discharge planning, Functional electrical stimulation, Pain management, Skin care/wound management, Therapeutic Activities, UE/LE Coordination activities, Cognitive remediation/compensation, Disease management/prevention, Functional mobility training, Splinting/orthotics, Patient/family education, Therapeutic Exercise, Visual/perceptual remediation/compensation  OT Interventions Warden/ranger, Cognitive remediation/compensation, Community reintegration, Discharge planning, Disease mangement/prevention, DME/adaptive equipment instruction, Pain management, Patient/family education, Psychosocial support, Self Care/advanced ADL retraining, Skin care/wound managment, Therapeutic Activities, Therapeutic Exercise, UE/LE Strength taining/ROM, Wheelchair propulsion/positioning  SLP Interventions    TR Interventions    SW/CM Interventions Discharge Planning, Psychosocial Support, Patient/Family Education, Disease Management/Prevention   Barriers to Discharge MD  Medical stability  Nursing Incontinence, Wound Care, Weight bearing restrictions Patient lives 2 level home with level entry, spouse available 24/7, main level bed/bath.  PT Home environment access/layout, Weight bearing restrictions    OT Decreased caregiver support, Incontinence, Wound Care, Lack of/limited family support, Weight bearing restrictions    SLP      SW       Team Discharge Planning: Destination: PT-Home ,OT- Home , SLP-  Projected Follow-up: PT-Home health PT, 24 hour supervision/assistance, OT-  Home health OT, SLP-  Projected Equipment Needs: PT-To be determined, OT- To be determined, SLP-  Equipment Details: PT- , OT-  Patient/family involved in discharge planning: PT- Patient,  OT-Patient, SLP-   MD ELOS: 18-21 days Medical Rehab Prognosis:  Excellent Assessment: The patient has been admitted for CIR therapies with the diagnosis of right AKA due  to critical limb ischemia with gangrene of the right foot. The team will be addressing functional mobility, strength, stamina, balance,  safety, adaptive techniques and equipment, self-care, bowel and bladder mgt, patient and caregiver education. Goals have been set at Min/Mod A. Anticipated discharge destination is home.        See Team Conference Notes for weekly updates to the plan of care

## 2024-02-18 LAB — GLUCOSE, CAPILLARY
Glucose-Capillary: 105 mg/dL — ABNORMAL HIGH (ref 70–99)
Glucose-Capillary: 129 mg/dL — ABNORMAL HIGH (ref 70–99)
Glucose-Capillary: 225 mg/dL — ABNORMAL HIGH (ref 70–99)
Glucose-Capillary: 194 mg/dL — ABNORMAL HIGH (ref 70–99)

## 2024-02-18 MED ORDER — QUETIAPINE FUMARATE 25 MG PO TABS
12.5000 mg | ORAL_TABLET | Freq: Two times a day (BID) | ORAL | Status: DC | PRN
  Administered 2024-02-18 – 2024-02-21 (×3): 12.5 mg via ORAL
  Filled 2024-02-18 (×3): qty 1

## 2024-02-18 NOTE — Progress Notes (Signed)
+/-   sleep. At shift change, attempting to get OOB without assistance, patient incontinent of B&B. Confused at that time. Ecchymosis observed to posterior aspect of AKA. Prevalon boot applied to left foot. 02 at 5L/m via [place]. At 2345, confused and agitated, Incontinent of B & B. Wants prevalon boot off, refused to allow the Nurse to remove foam dressing to left foot to assess or allow areas to be painted with betadine . Required 2 staff to change because of  confusion and agitation. Offered PRN ativan  for anxiety or PRN ultram  for pain, refused both. Will continue to monitor. Zymarion Favorite A

## 2024-02-18 NOTE — Progress Notes (Signed)
 Physical Therapy Session Note  Patient Details  Name: Reginald [name] MRN: [id number] Date of Birth: November 07, 1935  Today's Date: 02/18/2024 PT Individual Time: 9195-9155 PT Individual Time Calculation (min): 40 min   Short Term Goals: Week 1:  PT Short Term Goal 1 (Week 1): Pt will perform bed mobility with CGA. PT Short Term Goal 2 (Week 1): Pt will complete sit to stand with modA. PT Short Term Goal 3 (Week 1): Pt will complete bed to chair with modA consistently. PT Short Term Goal 4 (Week 1): Pt will self propel WC x100' wiht BUEs and minA.  Skilled Therapeutic Interventions/Progress Updates:     Pt received seated in [facility] and agrees to therapy. No complaint of pain. Pt performs supine to sit with bed features and cues for positioning at EOB. Pt performs seated scooting toward EOB with cues for foot position and body mechanics. Pt performs slideboard transfer from bed to [facility] with maxA facilitation at hips for lateral weigh shifting, with cues for weight transition anteriorly and hand placement. While seated in WC, pt dons shirt and shorts with assistance from PT. WC transport to gym. Pt performs sit to stand in parallel bars with modA and cues for body mechanics and sequencing> PT assists to pull pants up over hips and pt remains standing ~10 seconds. Following seated rest break, pt stands again with similar assistance and remains upright ~30 seconds with PT blocking Lt knee and facilitating hip and trunk extension. Extended seated rest break. Pt performs additional rep sit to stand and remains upright x53 seconds. WC transport back to room. Pt left seated in WC with all needs within reach.   Therapy Documentation Precautions:  Precautions Precautions: Fall Precaution/Restrictions Comments: R AKA NWB; 5L O2 [place] (chronic ~1 yr) Restrictions Weight Bearing Restrictions Per Provider Order: Yes RLE Weight Bearing Per Provider Order: Non weight bearing   Therapy/Group: Individual Therapy  Elsie JAYSON Dawn, PT, DPT 02/18/2024, 4:26 PM

## 2024-02-18 NOTE — Progress Notes (Signed)
 "                                                        PROGRESS NOTE   Subjective/Complaints: Patient states he is aware that sundowns at night, does not feel that last night was worse than usual. He is currently Aox2-3, speaking on the phone with his wife   ROS: pain is well controlled, breathing well, +circadian rhythm dysfunction, +sundowning at night   Objective:   No results found. No results for input(s): WBC, HGB, HCT, PLT in the last 72 hours.  No results for input(s): NA, K, CL, CO2, GLUCOSE, BUN, CREATININE, CALCIUM  in the last 72 hours.   Intake/Output Summary (Last 24 hours) at 02/18/2024 1318 Last data filed at 02/18/2024 0520 Gross per 24 hour  Intake 240 ml  Output 1300 ml  Net -1060 ml        Physical Exam: Vital Signs Blood pressure 101/78, pulse 80, temperature (!) 97.4 F (36.3 C), temperature source Oral, resp. rate 17, height 5' 8 (1.727 m), weight 76.8 kg, SpO2 100%. Gen: no distress, normal appearing HEENT: oral mucosa pink and moist, NCAT, [place] in place Cardio: Reg rate Chest: normal effort, normal rate of breathing Abd: soft, non-distended Ext: no edema Psych: pleasant, normal affect Skin: R AKA incision c/d/i Neuro: Aox2-3 (to year but not date), b/l UE with 4/5 strength, RLE with 4/5 strength, LLE with 4/5 strength except for 2/5 DF/PF, stable 2/1   Assessment/Plan: 1. Functional deficits which require 3+ hours per day of interdisciplinary therapy in a comprehensive inpatient rehab setting. Physiatrist is providing close team supervision and 24 hour management of active medical problems listed below. Physiatrist and rehab team continue to assess barriers to discharge/monitor patient progress toward functional and medical goals  Care Tool:  Bathing    Body parts bathed by patient: Right arm, Left arm, Chest, Abdomen, Face, Left upper leg, Left lower leg, Front perineal area, Right upper leg   Body parts bathed by  helper: Buttocks Body parts n/a: Right lower leg   Bathing assist Assist Level: Minimal Assistance - Patient > 75%     Upper Body Dressing/Undressing Upper body dressing   What is the patient wearing?: Pull over shirt    Upper body assist Assist Level: Minimal Assistance - Patient > 75%    Lower Body Dressing/Undressing Lower body dressing      What is the patient wearing?: Pants     Lower body assist Assist for lower body dressing: Maximal Assistance - Patient 25 - 49%     Toileting Toileting    Toileting assist Assist for toileting: Total Assistance - Patient < 25%     Transfers Chair/bed transfer  Transfers assist     Chair/bed transfer assist level: Moderate Assistance - Patient 50 - 74%     Locomotion Ambulation   Ambulation assist   Ambulation activity did not occur: Safety/medical concerns          Walk 10 feet activity   Assist  Walk 10 feet activity did not occur: Safety/medical concerns        Walk 50 feet activity   Assist Walk 50 feet with 2 turns activity did not occur: Safety/medical concerns         Walk 150 feet activity   Assist Walk 150  feet activity did not occur: Safety/medical concerns         Walk 10 feet on uneven surface  activity   Assist Walk 10 feet on uneven surfaces activity did not occur: Safety/medical concerns         Wheelchair     Assist Is the patient using a wheelchair?: Yes Type of Wheelchair: Manual    Wheelchair assist level: Minimal Assistance - Patient > 75% Max wheelchair distance: 59'    Wheelchair 50 feet with 2 turns activity    Assist        Assist Level: Minimal Assistance - Patient > 75%   Wheelchair 150 feet activity     Assist      Assist Level: Maximal Assistance - Patient 25 - 49%   Blood pressure 101/78, pulse 80, temperature (!) 97.4 F (36.3 C), temperature source Oral, resp. rate 17, height 5' 8 (1.727 m), weight 76.8 kg, SpO2  100%.  Medical Problem List and Plan: 1. Functional deficits secondary to Right AKA 01/30/24 due to critical limb ischemia with gangrene of the R foot             -patient may shower, cover incision             -ELOS/Goals: 18-21, PT/OT min-modA              -grounds pass ordered  Continue CIR  2.  New onset afib/aflutter: continue Eliquis   3. Pain Management: continue Ultram  prn  4. Circadian rhythm dysfunction: melatonin 1.5mg  scheduled HS. LCSW to follow for evaluation and support. Prn seroquel  ordered for agitation due to sundowning  5. Neuropsych/cognition: This patient is capable of making decisions on his own behalf. 6. Skin/Wound Care: Pressure relief measures.  --Will order prevalon boot for  left is boggy. --pain pressure ares on left later foot and toes with betadine .   R- BKA                        Left lateral heel          Left toes.       7. Fluids/Electrolytes/Nutrition: Strict I/O. Continue Vitamin C .              --add protein supplement.   9. R-AKA due to PAD/gangrene/ischemia: Has appt for staple removal 02/12  10.  Hypoalbuminemia: continue protein supplement  11.  T2DM with neuropathy: Monitor BS ac/hs and use SSI for elevated BS             Restart metformin  500mg  daily. Provided list of foods for diabetes  12. CAD s/p CABG: continue Lipitor, metoprolol  and Eliquis (off ASA)  13. Chronic systolic CHF/ICM w/EF: Monitor daily weight and for signs of overload.  --Strict I/O. --Continue Lipitor, Cozaar , Eliquis . Continue to hold jardiance to promote wound healing.  Weight reviewed and is increased. Restart Demadex - this was held previously due to hypotension- will d/c cozaar  to prevent hypotension while restarting Demadex   14.  Interstitial lung disease w/chronic hypoxic RF: Recommendations to resume Cellcept after dc. continue Prednisone  30 mg and Bactrim  DS 3 X /wk             --Dependent of 5 L oxygen per [place] at baseline- continue  15. Leucocytosis: WBC  reviewed and is improving, repeat WBC tomorrow  16. Pre-renal azotemia: repeat creatinine tomorrow  17. Anemia of chronic disease: Has been treating right foot infection since last June             --  Hgb stable at 10.5- 11.4   18. GERD: continue PPI     LOS: 4 days A FACE TO FACE EVALUATION WAS PERFORMED  Shalan Neault P Thatcher Doberstein 02/18/2024, 1:18 PM     "

## 2024-02-18 NOTE — Progress Notes (Incomplete)
 Occupational Therapy Session Note  Patient Details  Name: Reginald [name] MRN: [id number] Date of Birth: [birth year]  Today's Date: 02/19/2024  OT Individual Time: [phone]( session 1)  OT Individual Time Calculation (min): 46 min   OT Individual Time: 1057-1116( session 2)  OT Individual Time Calculation (min): 19 min  11 mins missed d/t impaired level of arousal  OT Individual Time: [phone]( session 3)  OT Individual Time Calculation (min): 40 min  20 mins missed d/t pt eating lunch   Short Term Goals: Week 1:  OT Short Term Goal 1 (Week 1): Pt will perform LB hiking with Mod A (x1) + PRN compensatory techniques. OT Short Term Goal 2 (Week 1): Pt will perform toilet transfer with Mod A (x1) + LRAD. OT Short Term Goal 3 (Week 1): Pt will participate in >10 mins of activity with x1 rest break as a means of improving activity tolerance.  Skilled Therapeutic Interventions/Progress Updates:  Session 1: Pt greeted asleep in supine, pt found with O2 off, Spo2 in the 80s, applied [place] with SpO2 reaching 95% by end of session.   Pt lethargic but was able to state name and location and reason for admission.pt keeping eyes closed most of session. Pt found to be saturated in urine. Provided MAX A +2 to roll in bed for linen/brief change and pericare. Total A for pericare and to don new gown from bed level. Donned shrinker with total A from bed level. No indications of pain noted during session. Session limited d/t impaired level of arousal.                  Ended session with pt supine in bed with all needs within reach and bed  alarm activated.                        Session 2:Pt greeted asleep in w/c, pt agreeable to OT intervention.      Transfers/bed mobility/functional mobility:  Pt complete slide board transfer to R side back to bed with total A for board placement and MAX A +2 for transfer, pt able to verbalize first step of needing to place board but then required max cues to recall  remainder of body mechanics. Once EOB, pt required MAX A for static sitting balance, totalA +2 to return to supine, pt immediately returned to sleeping once back in bed.  Ended session early d/t impaired level of arousal, pt missed 11 mins                Ended session with pt asleep in semi reclined in bed with all needs within reach and bed alarm activated.                Session 3: Pt greeted asleep in supine, wife present. Increased time needed to fully arouse pt with wife assisting as needed. No pain reported during session. Pt had not eaten lunch yet, set pt up for lunch.   While pt ate, Confirmed DC plan with wife to return home with wife who can provide 24/7 support. Wife confirmed ramp, walkin shower with lip for threshold and no stairs. Wife reports RW but no w/c or slide board. Educated wife on current level of function for ADLs/mobility.     Wife asking appropriate questions about goal level. Wife reports pt was continent prior to admission as this therapist reported that she found pt incontinent this AM, discussed potential sundowning lately with wife.  Pt  missed 20 mins of skilled OT d/t pt eating lunch.                   Ended session with pt upright in bed eating lunch with all needs within reach and bed alarm activated.                            Therapy Documentation Precautions:  Precautions Precautions: Fall Precaution/Restrictions Comments: R AKA NWB; 5L O2 [place] (chronic ~1 yr) Restrictions Weight Bearing Restrictions Per Provider Order: Yes RLE Weight Bearing Per Provider Order: Non weight bearing    Therapy/Group: Individual Therapy  Ronal Mallie Needy 02/19/2024, 2:59 PM

## 2024-02-18 NOTE — Progress Notes (Signed)
 Continues to exhibit unsafe behaviors. Telesitter ordered. Jermey Closs A

## 2024-02-18 NOTE — Progress Notes (Signed)
 Occupational Therapy Session Note  Patient Details  Name: Reginald [name] MRN: [id number] Date of Birth: [birth year]  Today's Date: 02/18/2024 OT Individual Time: 1100-1155 OT Individual Time Calculation (min): 55 min    Short Term Goals: Week 1:  OT Short Term Goal 1 (Week 1): Pt will perform LB hiking with Mod A (x1) + PRN compensatory techniques. OT Short Term Goal 2 (Week 1): Pt will perform toilet transfer with Mod A (x1) + LRAD. OT Short Term Goal 3 (Week 1): Pt will participate in >10 mins of activity with x1 rest break as a means of improving activity tolerance.  Skilled Therapeutic Interventions/Progress Updates:    Pt resting in w/c upon arrival. Skilled OT intervention with focus on TB transfers and BUE therex to facilitate pt's independence with BADLs. Pt reports his plan is to d/c home outside of [facility] and his wife to assist with BADLs. Pt also reports he was walking with RW at home PTA. Pt reports his wife has been helping him with BADLs PTA. TB transfers w/c<>EOB with max A and max verbal cues for sequencing. BUE therex: chest presses 3# 3x10 and overhead presses 2# 3x10 with extended rest breaks. Pt remained in w/c with all needs within reach. Belt alarm activated and telesitter present.   Therapy Documentation Precautions:  Precautions Precautions: Fall Precaution/Restrictions Comments: R AKA NWB; 5L O2 [place] (chronic ~1 yr) Restrictions Weight Bearing Restrictions Per Provider Order: Yes RLE Weight Bearing Per Provider Order: Non weight bearing   Pain:  Pt denies pain this morning   Therapy/Group: Individual Therapy  Maritza Debby Mare 02/18/2024, 12:01 PM

## 2024-02-19 ENCOUNTER — Inpatient Hospital Stay (HOSPITAL_COMMUNITY)

## 2024-02-19 DIAGNOSIS — I739 Peripheral vascular disease, unspecified: Secondary | ICD-10-CM

## 2024-02-19 DIAGNOSIS — R7989 Other specified abnormal findings of blood chemistry: Secondary | ICD-10-CM

## 2024-02-19 DIAGNOSIS — I5022 Chronic systolic (congestive) heart failure: Secondary | ICD-10-CM

## 2024-02-19 DIAGNOSIS — R4189 Other symptoms and signs involving cognitive functions and awareness: Secondary | ICD-10-CM

## 2024-02-19 LAB — BASIC METABOLIC PANEL WITH GFR
Anion gap: 8 (ref 5–15)
BUN: 37 mg/dL — ABNORMAL HIGH (ref 8–23)
CO2: 31 mmol/L (ref 22–32)
Calcium: 9.3 mg/dL (ref 8.9–10.3)
Chloride: 96 mmol/L — ABNORMAL LOW (ref 98–111)
Creatinine, Ser: 0.8 mg/dL (ref 0.61–1.24)
GFR, Estimated: >60 mL/min
Glucose, Bld: 125 mg/dL — ABNORMAL HIGH (ref 70–99)
Potassium: 4.2 mmol/L (ref 3.5–5.1)
Sodium: 135 mmol/L (ref 135–145)

## 2024-02-19 LAB — CBC
HCT: 36.3 % — ABNORMAL LOW (ref 39.0–52.0)
Hemoglobin: 10.7 g/dL — ABNORMAL LOW (ref 13.0–17.0)
MCH: 24.7 pg — ABNORMAL LOW (ref 26.0–34.0)
MCHC: 29.5 g/dL — ABNORMAL LOW (ref 30.0–36.0)
MCV: 83.8 fL (ref 80.0–100.0)
Platelets: 254 10*3/uL (ref 150–400)
RBC: 4.33 MIL/uL (ref 4.22–5.81)
RDW: 18 % — ABNORMAL HIGH (ref 11.5–15.5)
WBC: 12.1 10*3/uL — ABNORMAL HIGH (ref 4.0–10.5)
nRBC: 0 % (ref 0.0–0.2)

## 2024-02-19 LAB — GLUCOSE, CAPILLARY
Glucose-Capillary: 123 mg/dL — ABNORMAL HIGH (ref 70–99)
Glucose-Capillary: 129 mg/dL — ABNORMAL HIGH (ref 70–99)
Glucose-Capillary: 140 mg/dL — ABNORMAL HIGH (ref 70–99)

## 2024-02-19 MED ORDER — FUROSEMIDE 10 MG/ML IJ SOLN
40.0000 mg | Freq: Once | INTRAMUSCULAR | Status: AC
  Administered 2024-02-19: 40 mg via INTRAVENOUS
  Filled 2024-02-19: qty 4

## 2024-02-20 ENCOUNTER — Inpatient Hospital Stay (HOSPITAL_COMMUNITY)

## 2024-02-20 DIAGNOSIS — I5022 Chronic systolic (congestive) heart failure: Secondary | ICD-10-CM

## 2024-02-20 LAB — BASIC METABOLIC PANEL WITH GFR
Anion gap: 11 (ref 5–15)
BUN: 35 mg/dL — ABNORMAL HIGH (ref 8–23)
CO2: 31 mmol/L (ref 22–32)
Calcium: 9.5 mg/dL (ref 8.9–10.3)
Chloride: 96 mmol/L — ABNORMAL LOW (ref 98–111)
Creatinine, Ser: 0.79 mg/dL (ref 0.61–1.24)
GFR, Estimated: >60 mL/min
Glucose, Bld: 110 mg/dL — ABNORMAL HIGH (ref 70–99)
Potassium: 3.9 mmol/L (ref 3.5–5.1)
Sodium: 138 mmol/L (ref 135–145)

## 2024-02-20 LAB — CBC
HCT: 40 % (ref 39.0–52.0)
Hemoglobin: 12.1 g/dL — ABNORMAL LOW (ref 13.0–17.0)
MCH: 25.1 pg — ABNORMAL LOW (ref 26.0–34.0)
MCHC: 30.3 g/dL (ref 30.0–36.0)
MCV: 83 fL (ref 80.0–100.0)
Platelets: 270 10*3/uL (ref 150–400)
RBC: 4.82 MIL/uL (ref 4.22–5.81)
RDW: 18.3 % — ABNORMAL HIGH (ref 11.5–15.5)
WBC: 14.1 10*3/uL — ABNORMAL HIGH (ref 4.0–10.5)
nRBC: 0 % (ref 0.0–0.2)

## 2024-02-20 LAB — GLUCOSE, CAPILLARY
Glucose-Capillary: 149 mg/dL — ABNORMAL HIGH (ref 70–99)
Glucose-Capillary: 109 mg/dL — ABNORMAL HIGH (ref 70–99)
Glucose-Capillary: 155 mg/dL — ABNORMAL HIGH (ref 70–99)
Glucose-Capillary: 168 mg/dL — ABNORMAL HIGH (ref 70–99)
Glucose-Capillary: 186 mg/dL — ABNORMAL HIGH (ref 70–99)

## 2024-02-20 LAB — PROCALCITONIN: Procalcitonin: <0.1 ng/mL

## 2024-02-20 LAB — PRO BRAIN NATRIURETIC PEPTIDE: Pro Brain Natriuretic Peptide: 1624 pg/mL — ABNORMAL HIGH

## 2024-02-20 MED ORDER — FUROSEMIDE 40 MG PO TABS
40.0000 mg | ORAL_TABLET | Freq: Every day | ORAL | Status: DC
  Administered 2024-02-20 – 2024-02-23 (×4): 40 mg via ORAL
  Filled 2024-02-20 (×4): qty 1

## 2024-02-20 MED ORDER — LOSARTAN POTASSIUM 25 MG PO TABS
12.5000 mg | ORAL_TABLET | Freq: Every day | ORAL | Status: AC
  Administered 2024-02-20 – 2024-02-23 (×4): 12.5 mg via ORAL
  Filled 2024-02-20 (×4): qty 1

## 2024-02-20 NOTE — Progress Notes (Signed)
"  °  Progress Note  Patient Name: Reginald [name] Date of Encounter: 02/20/2024 [facility] Cardiologist: [facility]  Interval Summary   Doing well, his 89th birthday today His wife is present in the room Asked to re-round in the setting of determining Lasix  need moving forward   Vital Signs Vitals:   02/19/24 2018 02/20/24 0317 02/20/24 0500 02/20/24 1439  BP: 106/89 102/64  (!) 131/95  Pulse: 74 68  62  Resp: 18 18  18   Temp: 98.5 F (36.9 C) (!) 97.5 F (36.4 C)  98.6 F (37 C)  TempSrc:  Oral  Oral  SpO2: 95% 98%  95%  Weight:   76 kg   Height:        Intake/Output Summary (Last 24 hours) at 02/20/2024 1621 Last data filed at 02/20/2024 1254 Gross per 24 hour  Intake 600 ml  Output 1000 ml  Net -400 ml      02/20/2024    5:00 AM 02/19/2024    4:46 AM 02/18/2024    5:11 AM  Last 3 Weights  Weight (lbs) 167 lb 8.8 oz 168 lb 10.4 oz 169 lb 5 oz  Weight (kg) 76 kg 76.5 kg 76.8 kg     Telemetry/ECG  N/A  - Personally Reviewed  Physical Exam  GEN: No acute distress, remains on [place] at 5 L.   Neck: No JVD Cardiac: regular rate with irregular rhythm.  Respiratory: coarse breath sounds bilaterally. GI: Soft, nontender, non-distended  MS: No edema on L, R s/p BKA  Assessment & Plan   Atrial flutter, new onset Developed atrial flutter in the setting of recent BKA  Started on Eliquis  this admission, tolerating well  Prefer to avoid TEE given chronic hypoxic respiratory failure Not presently on telemetry but rates remain controlled Continue Eliquis  5 mg BID  Plan for continuation of uninterrupted DOAC for 3-4 weeks then consider DCCV if he remains in A-flutter Continue Toprol  12.5 mg daily   Acute on chronic HFrEF Severe RV systolic dysfunction  Pulmonary HTN  History of ICM with LVEF 20-25% Started on home medications  Lasix  was held due to hypotension, BP has recovered Previously on Losartan  12.5 mg daily, this was stopped 1/31 to allow for BP room when re-starting  torsemide  10 mg daily Restart PO Lasix  40 mg daily today -- assess renal function and BP response over the next few days Continue Toprol  12.5 mg daily Restart Losartan  12.5 mg daily today -- monitor BP Continue strict I&O's, daily weights, daily BMPs  CAD s/p CABG  Hyperlipidemia  Denies any active chest pain No longer on ASA in the setting of Eliquis  use  Continue Lipitor 40 mg daily Continue fenofibrate  54 mg daily   For questions or updates, please contact [facility] Please consult [email or web address] for contact info under   Signed, Waddell DELENA Donath, PA-C   "

## 2024-02-20 NOTE — Progress Notes (Signed)
 "                                                        PROGRESS NOTE   Subjective/Complaints: As per wife patient appears much more alert today. Discussed CXR results, wife states she was told cardiology would be following today  ROS: denies residual limb pain   Objective:   DG Chest 2 View Result Date: 02/19/2024 CLINICAL DATA:  Short of breath status post above the knee amputation of the right lower extremity EXAM: CHEST - 2 VIEW COMPARISON:  Prior chest x-ray 01/30/2024 FINDINGS: Pulmonary vascular congestion with diffuse interstitial opacities bilaterally consistent with mild interstitial pulmonary edema. Very low inspiratory volumes. Stable cardiomegaly. Atherosclerotic calcifications present throughout the aorta. Patient is status post median sternotomy. No pneumothorax. Suspect small bilateral layering pleural effusions. No acute osseous abnormality. IMPRESSION: 1. Mild-moderate CHF. 2. Suspect small bilateral pleural effusions. Electronically Signed   By: Wilkie Lent M.D.   On: 02/19/2024 16:02   Recent Labs    02/19/24 0441 02/20/24 0549  WBC 12.1* 14.1*  HGB 10.7* 12.1*  HCT 36.3* 40.0  PLT 254 270    Recent Labs    02/19/24 0441 02/20/24 0549  NA 135 138  K 4.2 3.9  CL 96* 96*  CO2 31 31  GLUCOSE 125* 110*  BUN 37* 35*  CREATININE 0.80 0.79  CALCIUM  9.3 9.5     Intake/Output Summary (Last 24 hours) at 02/20/2024 0945 Last data filed at 02/20/2024 0835 Gross per 24 hour  Intake 720 ml  Output 1200 ml  Net -480 ml        Physical Exam: Vital Signs Blood pressure 102/64, pulse 68, temperature (!) 97.5 F (36.4 C), temperature source Oral, resp. rate 18, height 5' 8 (1.727 m), weight 76 kg, SpO2 98%. Constitutional: No distress . Vital signs reviewed. HEENT: NCAT, EOMI, oral membranes moist Neck: supple Cardiovascular: RRR without murmur. No JVD    Respiratory/Chest: CTA Bilaterally without wheezes or rales. Normal effort    GI/Abdomen: BS +,  non-tender, non-distended Ext: no clubbing, cyanosis,   Psych: pleasant, a little flat but cooperative  Skin: R AKA incision c/d/I, some bruising around incision Neuro: Aox2-3 (to year but not date), b/l UE with 4/5 strength, RLE with 4/5 strength, LLE with 4/5 strength except for 2/5 DF/PF, stable 2/3 Musc: Right AKA stump with swelling. Good shape, minimally tender with palpation   Assessment/Plan: 1. Functional deficits which require 3+ hours per day of interdisciplinary therapy in a comprehensive inpatient rehab setting. Physiatrist is providing close team supervision and 24 hour management of active medical problems listed below. Physiatrist and rehab team continue to assess barriers to discharge/monitor patient progress toward functional and medical goals  Care Tool:  Bathing    Body parts bathed by patient: Right arm, Left arm, Chest, Abdomen, Face, Left upper leg, Left lower leg, Front perineal area, Right upper leg   Body parts bathed by helper: Buttocks Body parts n/a: Right lower leg   Bathing assist Assist Level: Minimal Assistance - Patient > 75%     Upper Body Dressing/Undressing Upper body dressing   What is the patient wearing?: Pull over shirt    Upper body assist Assist Level: Minimal Assistance - Patient > 75%    Lower Body Dressing/Undressing Lower body dressing  What is the patient wearing?: Pants     Lower body assist Assist for lower body dressing: Maximal Assistance - Patient 25 - 49%     Toileting Toileting    Toileting assist Assist for toileting: Total Assistance - Patient < 25%     Transfers Chair/bed transfer  Transfers assist     Chair/bed transfer assist level: Moderate Assistance - Patient 50 - 74%     Locomotion Ambulation   Ambulation assist   Ambulation activity did not occur: Safety/medical concerns          Walk 10 feet activity   Assist  Walk 10 feet activity did not occur: Safety/medical concerns         Walk 50 feet activity   Assist Walk 50 feet with 2 turns activity did not occur: Safety/medical concerns         Walk 150 feet activity   Assist Walk 150 feet activity did not occur: Safety/medical concerns         Walk 10 feet on uneven surface  activity   Assist Walk 10 feet on uneven surfaces activity did not occur: Safety/medical concerns         Wheelchair     Assist Is the patient using a wheelchair?: Yes Type of Wheelchair: Manual    Wheelchair assist level: Minimal Assistance - Patient > 75% Max wheelchair distance: 72'    Wheelchair 50 feet with 2 turns activity    Assist        Assist Level: Minimal Assistance - Patient > 75%   Wheelchair 150 feet activity     Assist      Assist Level: Maximal Assistance - Patient 25 - 49%   Blood pressure 102/64, pulse 68, temperature (!) 97.5 F (36.4 C), temperature source Oral, resp. rate 18, height 5' 8 (1.727 m), weight 76 kg, SpO2 98%.  Medical Problem List and Plan: 1. 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  New onset afib/aflutter: continue Eliquis   3. Pain Management: continue Ultram  prn  4. Circadian rhythm dysfunction: melatonin 1.5mg  scheduled HS. LCSW to follow for evaluation and support. Prn seroquel  ordered for agitation due to sundowning  2/2 start sleep chart so that we can follow his patterns 5. Neuropsych/cognition: This patient is capable of making decisions on his own behalf. 6. Skin/Wound Care: Pressure relief measures.  --  prevalon boot for  left is boggy. --pain pressure ares on left later foot and toes with betadine .   R- BKA                        Left lateral heel          Left toes.       7.  Fluids/Electrolytes/Nutrition: Strict I/O. Continue Vitamin C .              --add protein supplement.   9. R-AKA due to PAD/gangrene/ischemia: Has appt for staple removal 02/12  10.  Hypoalbuminemia: continue protein supplement  11.  T2DM with neuropathy: Monitor BS ac/hs and use SSI for elevated  BS             2/2 Restarted metformin  500mg  daily 2/1.     -monitor for pattern today    -a list of foods for diabetes was provided over weekend  CBG (last 3)  Recent Labs    02/19/24 0528 02/19/24 1202 02/19/24 2124  GLUCAP 123* 129* 140*    12. CAD s/p CABG: continue Lipitor, metoprolol  and Eliquis (off ASA)  13. Chronic systolic CHF/ICM w/EF: Monitor daily weight and for signs of overload.  --Strict I/O. --Continue Lipitor, Cozaar , Eliquis . Continue to hold jardiance to promote wound healing.  Cardiology consulted given pleural effusions on CXR, rising BUN with demadex  -weight reviewed today and is stable  14.  Interstitial lung disease w/chronic hypoxic RF: Recommendations to resume Cellcept after dc. continue Prednisone  30 mg and Bactrim  DS 3 X /wk             --Dependent of 5 L oxygen per [place] at baseline- continue  -pt with crackles at bases on exam--encourage IS, OOB  15. Leucocytosis: WBC reviewed and is increased. Repeat ordered for tomorrow, procalcitonin ordered for today  16. Pre-renal azotemia: Cr reviewed and is stable  17. Anemia of chronic disease: Hgb reviewed and is improving  18. GERD: continue PPI     LOS: 6 days A FACE TO FACE EVALUATION WAS PERFORMED  Reginald [name] 02/20/2024, 9:45 AM     "

## 2024-02-20 NOTE — Progress Notes (Signed)
 Orthopedic Tech Progress Note Patient Details:  Reginald [name] 1935-11-28 [id number]  Called in order to [employer] for an AKA SHRINKER with rubber grippers     Patient ID: Reginald [name], male   DOB: 1935-02-16, 89 y.o.   MRN: [id number]

## 2024-02-20 NOTE — Plan of Care (Signed)
" °  Problem: Consults Goal: RH LIMB LOSS PATIENT EDUCATION Description: Description: See Patient Education module for eduction specifics. Outcome: Progressing Goal: Skin Care Protocol Initiated - if Braden Score 18 or less Description: If consults are not indicated, leave blank or document N/A Outcome: Progressing Goal: Nutrition Consult-if indicated Outcome: Progressing Goal: Diabetes Guidelines if Diabetic/Glucose > 140 Description: If diabetic or lab glucose is > 140 mg/dl - Initiate Diabetes/Hyperglycemia Guidelines & Document Interventions  Outcome: Progressing   Problem: RH BOWEL ELIMINATION Goal: RH STG MANAGE BOWEL WITH ASSISTANCE Description: STG Manage Bowel with Assistance. Outcome: Progressing Goal: RH STG MANAGE BOWEL W/MEDICATION W/ASSISTANCE Description: STG Manage Bowel with Medication with Assistance. Outcome: Progressing   Problem: RH BLADDER ELIMINATION Goal: RH STG MANAGE BLADDER WITH ASSISTANCE Description: STG Manage Bladder With Assistance Outcome: Progressing   Problem: RH SKIN INTEGRITY Goal: RH STG MAINTAIN SKIN INTEGRITY WITH ASSISTANCE Description: STG Maintain Skin Integrity With Assistance. Outcome: Progressing Goal: RH STG ABLE TO PERFORM INCISION/WOUND CARE W/ASSISTANCE Description: STG Able To Perform Incision/Wound Care With Assistance. Outcome: Progressing   Problem: RH SAFETY Goal: RH STG ADHERE TO SAFETY PRECAUTIONS W/ASSISTANCE/DEVICE Description: STG Adhere to Safety Precautions With Assistance/Device. Outcome: Progressing Goal: RH STG DECREASED RISK OF FALL WITH ASSISTANCE Description: STG Decreased Risk of Fall With Assistance. Outcome: Progressing   Problem: RH PAIN MANAGEMENT Goal: RH STG PAIN MANAGED AT OR BELOW PT'S PAIN GOAL Outcome: Progressing   Problem: RH KNOWLEDGE DEFICIT LIMB LOSS Goal: RH STG INCREASE KNOWLEDGE OF SELF CARE AFTER LIMB LOSS Outcome: Progressing   "

## 2024-02-20 NOTE — Progress Notes (Signed)
 Occupational Therapy Session Note  Patient Details  Name: Reginald [name] MRN: [id number] Date of Birth: Mar 04, 1935  Today's Date: 02/20/2024 OT Individual Time: 9054-8959 OT Individual Time Calculation (min): 55 min    Short Term Goals: Week 1:  OT Short Term Goal 1 (Week 1): Pt will perform LB hiking with Mod A (x1) + PRN compensatory techniques. OT Short Term Goal 2 (Week 1): Pt will perform toilet transfer with Mod A (x1) + LRAD. OT Short Term Goal 3 (Week 1): Pt will participate in >10 mins of activity with x1 rest break as a means of improving activity tolerance.   Skilled Therapeutic Interventions/Progress Updates:    1:1 Pt received in the w/c with wife at side. Focus on w/c mechanics to move the w/c from next to the bed to the sink to perform oral care and washing face with setup. Continued work with propelling the w/c with mod A especially with turns. Navigated about ~ 60 feet. Taken to the dayroom. Practiced navigating w/c in prep for transfer onto the mat, locking the braces, removing arm rest and leg rest with max A with instructional cues. Pt transferred on to the mat with min A with cues for weight shifting forward. Continued focus on functional mobility and transitional movement. Pt performed EOM push ups using yoga blocks with A to maintain left foot contact on the floor. Pt performed sit to modified standing with hands on armed chair in front of him to promote forward weight shift , laterally squats along the mat with min A for increased bottom clearance. Then performed UB strengthening with 2 lb weighted bar ; performing rowing.   Pt returned to the w/c with min A with continued focus on increasing bottom clearance and weight shift forward. Pt left sitting up in the w/c with call bell in hand with wife at his side.   Therapy Documentation Precautions:  Precautions Precautions: Fall Precaution/Restrictions Comments: R AKA NWB; 5L O2 [place] (chronic ~1 yr) Restrictions Weight Bearing  Restrictions Per Provider Order: Yes RLE Weight Bearing Per Provider Order: Non weight bearing  Pain:  No reports of pain   Therapy/Group: Individual Therapy  Claudene Nest [facility] 02/20/2024, 1:07 PM

## 2024-02-20 NOTE — Progress Notes (Signed)
 Physical Therapy Session Note  Patient Details  Name: Reginald [name] MRN: [id number] Date of Birth: April 04, 1935  Today's Date: 02/20/2024 PT Individual Time: 8651-8540 PT Individual Time Calculation (min): 71 min   Short Term Goals: Week 1:  PT Short Term Goal 1 (Week 1): Pt will perform bed mobility with CGA. PT Short Term Goal 2 (Week 1): Pt will complete sit to stand with modA. PT Short Term Goal 3 (Week 1): Pt will complete bed to chair with modA consistently. PT Short Term Goal 4 (Week 1): Pt will self propel WC x100' wiht BUEs and minA.  Skilled Therapeutic Interventions/Progress Updates: Pt presents supine in bed and reluctantly agreeable to therapy.  Pt rolled side to side w/ CGA and cues to don shorts.  Pt required min A for sup to sit.  Pt required max A to place SB to transfer to L.  Pt required only min A for slide to w/c and then able to lift to clear hips enough for total A to pull out SB.  Pt requires seated rest breaks throughout session 2/2 fatigue/SOB although remains >93% on 5LPM.  Pt wheeled to main gym into // bars.  Pt transfers sit to stand w/ max A for <1 min.  Pt attempted 2nd trial after extended rest break, but unable 2/2 fatigue.  Pt has received new shrinker and pt returned to room for donning.  Pt required cues for lean to place SB w/ total A.  Pt initially transfers w/ min A, but requires mod A to complete.  Min A for sit to supine.  Pt rolled side to side w/ min/CGA to doff shorts and don shrinker.  Pt remained supine w/ all needs in reach, spouse present.     Therapy Documentation Precautions:  Precautions Precautions: Fall Precaution/Restrictions Comments: R AKA NWB; 5L O2 [place] (chronic ~1 yr) Restrictions Weight Bearing Restrictions Per Provider Order: Yes RLE Weight Bearing Per Provider Order: Non weight bearing General:   Vital Signs: Therapy Vitals Temp: 98.6 F (37 C) Temp Source: Oral Pulse Rate: 62 Resp: 18 BP: (!) 131/95 Patient Position (if  appropriate): Lying Oxygen Therapy SpO2: 95 % O2 Device: Room Air Pain:0/10      Therapy/Group: Individual Therapy  Lonn Im P Evian Salguero 02/20/2024, 3:03 PM

## 2024-02-20 NOTE — Progress Notes (Signed)
 Physical Therapy Session Note  Patient Details  Name: Reginald [name] MRN: [id number] Date of Birth: 11-05-1935  Today's Date: 02/20/2024 PT Individual Time: 0800-0844 PT Individual Time Calculation (min): 44 min   Short Term Goals: Week 1:  PT Short Term Goal 1 (Week 1): Pt will perform bed mobility with CGA. PT Short Term Goal 2 (Week 1): Pt will complete sit to stand with modA. PT Short Term Goal 3 (Week 1): Pt will complete bed to chair with modA consistently. PT Short Term Goal 4 (Week 1): Pt will self propel WC x100' wiht BUEs and minA.  Skilled Therapeutic Interventions/Progress Updates:      Pt presents in bed, finishing up his breakfast - LPN at bedside for med pass, who reports patient seems more alert and awake today compared to yesterday. Today is the patient's birthday! He has no c/o pain, on 5L O2. Assist provided for cutting sausage and eggs into smaller, more manageable pieces, as pt requests assistance.   Donned R AKA shrinker with assist - able to roll in bed with supervision both directions while pulling from bed rails. Donned pants in similar manner. Supine<>sitting EOB at supervision level with cues for safety and general setup.   Donned shirt as he sat EOB with supervision for balance. Completed squat pivot transfer with modA to his L side from bed to w/c. Connected to portable O2 tank and then taken to day room gym.   Seated there-ex for BUE strengthening and improving activity tolerance: -2x10 bicep curls with 5# rod -2x10 chest press with 5# rod.  Wheelchair mobility ~75' with minA towards his room, minA for keeping straight path and turns. Pt may benefit from custom lightweight w/c.   Returned to his room, left sitting up with his wife present at the bedside. Safety belt alarm on, call bell in lap.   Therapy Documentation Precautions:  Precautions Precautions: Fall Precaution/Restrictions Comments: R AKA NWB; 5L O2 [place] (chronic ~1 yr) Restrictions Weight  Bearing Restrictions Per Provider Order: Yes RLE Weight Bearing Per Provider Order: Non weight bearing General:      Therapy/Group: Individual Therapy  Sherlean SHAUNNA Perks 02/20/2024, 7:33 AM

## 2024-02-21 LAB — CBC WITH DIFFERENTIAL/PLATELET
Abs Immature Granulocytes: 0.08 10*3/uL — ABNORMAL HIGH (ref 0.00–0.07)
Basophils Absolute: 0 10*3/uL (ref 0.0–0.1)
Basophils Relative: 0 %
Eosinophils Absolute: 0 10*3/uL (ref 0.0–0.5)
Eosinophils Relative: 0 %
HCT: 38.7 % — ABNORMAL LOW (ref 39.0–52.0)
Hemoglobin: 11.6 g/dL — ABNORMAL LOW (ref 13.0–17.0)
Immature Granulocytes: 1 %
Lymphocytes Relative: 6 %
Lymphs Abs: 0.7 10*3/uL (ref 0.7–4.0)
MCH: 25 pg — ABNORMAL LOW (ref 26.0–34.0)
MCHC: 30 g/dL (ref 30.0–36.0)
MCV: 83.4 fL (ref 80.0–100.0)
Monocytes Absolute: 0.5 10*3/uL (ref 0.1–1.0)
Monocytes Relative: 5 %
Neutro Abs: 9.8 10*3/uL — ABNORMAL HIGH (ref 1.7–7.7)
Neutrophils Relative %: 88 %
Platelets: 257 10*3/uL (ref 150–400)
RBC: 4.64 MIL/uL (ref 4.22–5.81)
RDW: 18 % — ABNORMAL HIGH (ref 11.5–15.5)
WBC: 11.2 10*3/uL — ABNORMAL HIGH (ref 4.0–10.5)
nRBC: 0 % (ref 0.0–0.2)

## 2024-02-21 LAB — GLUCOSE, CAPILLARY
Glucose-Capillary: 138 mg/dL — ABNORMAL HIGH (ref 70–99)
Glucose-Capillary: 147 mg/dL — ABNORMAL HIGH (ref 70–99)
Glucose-Capillary: 256 mg/dL — ABNORMAL HIGH (ref 70–99)
Glucose-Capillary: 122 mg/dL — ABNORMAL HIGH (ref 70–99)

## 2024-02-21 LAB — BASIC METABOLIC PANEL WITH GFR
Anion gap: 10 (ref 5–15)
BUN: 42 mg/dL — ABNORMAL HIGH (ref 8–23)
CO2: 31 mmol/L (ref 22–32)
Calcium: 9.4 mg/dL (ref 8.9–10.3)
Chloride: 97 mmol/L — ABNORMAL LOW (ref 98–111)
Creatinine, Ser: 0.63 mg/dL (ref 0.61–1.24)
GFR, Estimated: >60 mL/min
Glucose, Bld: 146 mg/dL — ABNORMAL HIGH (ref 70–99)
Potassium: 4 mmol/L (ref 3.5–5.1)
Sodium: 138 mmol/L (ref 135–145)

## 2024-02-21 MED ORDER — GABAPENTIN 300 MG PO CAPS
300.0000 mg | ORAL_CAPSULE | Freq: Every day | ORAL | Status: AC
  Administered 2024-02-22 – 2024-02-23 (×2): 300 mg via ORAL
  Filled 2024-02-21 (×2): qty 1

## 2024-02-21 MED ORDER — GERHARDT'S BUTT CREAM
1.0000 | TOPICAL_CREAM | Freq: Three times a day (TID) | CUTANEOUS | Status: AC
  Administered 2024-02-21 – 2024-02-23 (×8): 1 via TOPICAL
  Filled 2024-02-21: qty 60

## 2024-02-21 NOTE — Progress Notes (Signed)
 Occupational Therapy Weekly Progress Note  Patient Details  Name: Reginald [name] MRN: [id number] Date of Birth: [birth year]  Beginning of progress report period: February 15, 2024 End of progress report period: February 21, 2024  Today's Date: 02/21/2024 OT Individual Time: 0733 - 0830 OT Treatment Time:  57 min      Patient has met 1 of 3 short term goals.  Patient has had minimal opportunity to practice some of the short term goals at this point.  Patient fairly sedentary prior to this hospitalization.    Patient continues to demonstrate the following deficits: muscle weakness, decreased cardiorespiratoy endurance and decreased oxygen support, and decreased sitting balance, decreased standing balance, decreased postural control, and decreased balance strategies and therefore will continue to benefit from skilled OT intervention to enhance overall performance with BADL and Reduce care partner burden.  Patient progressing toward long term goals..  Continue plan of care.  OT Short Term Goals Week 1:  OT Short Term Goal 1 (Week 1): Pt will perform LB hiking with Mod A (x1) + PRN compensatory techniques. OT Short Term Goal 1 - Progress (Week 1): Progressing toward goal OT Short Term Goal 2 (Week 1): Pt will perform toilet transfer with Mod A (x1) + LRAD. OT Short Term Goal 2 - Progress (Week 1): Not met OT Short Term Goal 3 (Week 1): Pt will participate in >10 mins of activity with x1 rest break as a means of improving activity tolerance. OT Short Term Goal 3 - Progress (Week 1): Met Week 2:  OT Short Term Goal 1 (Week 2): Patient will complete an active transfer to toilet with mod assist or less OT Short Term Goal 2 (Week 2): Patient will complete toileting with mod assist OT Short Term Goal 3 (Week 2): Patient will don elastic wasit pants with min assist  Skilled Therapeutic Interventions/Progress Updates:   Patient received supine in bed sleeping.  Patient slow to wake up fully.  Once awake  assisted patient to roll left and right and 1/2 bridge to don pants, adjust stump shrinker.  Patient transitioned to sitting with min assist, and then needed mod assist to sit at edge of bed.  Patient with backward and rightward lean.  Assisted patient to get foot to floor to stabilize sitting balance.  Patient needed mod assist to don pull over shirt while seated edge of bed.   Patient transferred to wheelchair with mod assist toward left via squat pivot.  Patient sat upright in chair for breakfast, then grooming at sink.   Patient left up in chair at end of session for brief rest prior to PT.  Chair alarm in place and engaged and call bell/ personal items in reach.    Therapy Documentation Precautions:  Precautions Precautions: Fall Precaution/Restrictions Comments: R AKA NWB; 5L O2 [place] (chronic ~1 yr) Restrictions Weight Bearing Restrictions Per Provider Order: Yes RLE Weight Bearing Per Provider Order: Non weight bearing Pain:  Denies pain    Therapy/Group: Individual Therapy  Miraj Truss M 02/21/2024, 12:23 PM

## 2024-02-21 NOTE — Patient Care Conference (Signed)
 Inpatient RehabilitationTeam Conference and Plan of Care Update Date: 02/21/2024   Time: 11:21 AM   Patient was admitted after the interdisciplinary team meeting took place on 02/14/2024; as a result, the first team conference occurred on day 8 of this patient's stay.    Patient Name: Reginald [name]      Medical Record Number: [id number]  Date of Birth: Mar 20, 1935 Sex: Male         Room/Bed: 4W02C/4W02C-01 Payor Info: Payor: MEDICARE / Plan: MEDICARE PART A AND B / Product Type: *No Product type* /    Admit Date/Time:  02/14/2024  4:07 PM  Primary Diagnosis:  Above-knee amputation of right lower extremity [facility])  Hospital Problems: Principal Problem:   Above-knee amputation of right lower extremity (HCC) Active Problems:   Atrial flutter with controlled response (HCC)   Cognitive changes   Chronic HFrEF (heart failure with reduced ejection fraction) [facility])    Expected Discharge Date: Expected Discharge Date: 03/05/24  Team Members Present: Physician leading conference: Dr. Sven Elks Social Worker Present: Waverly Gentry, LCSW-A Nurse Present: Barnie Ronde, RN PT Present: Sherlean Perks, PT OT Present: Monica Peacock, OT SLP Present: Recardo Mole, SLP PPS Coordinator present : Eleanor Colon, SLP     Current Status/Progress Goal Weekly Team Focus  Bowel/Bladder   Pt is continent of B&B with episodes of urgency incontinence LBM 2/2   Pt will remain continent of B&B  with fewer episodes of urgency incontinence   Pt will be assisted with tolieting to remain continent of B&B with fewer episodes of urgency incontinence    Swallow/Nutrition/ Hydration               ADL's   Max/mod assist BADL, Mod assist transfers   Min assist   Improve endurance and participation in BADL/ Functional mobility    Mobility   minA bed mobility, modA squat pivot transfers, minA w/c mobility, mod/maxA sit<>stands in // bars   minA - wheelchair level  bed mobility, functional transfers, activity  tolerance, w.c mobility    Communication                Safety/Cognition/ Behavioral Observations               Pain   Pt denies pain or any discomfort   pt will remain pain free   pt will be assessed for pain Q shift to ensure pain is managed and is less than 3 on the pain scale    Skin   Pt has a surgical incision to R AKA and an abrasion to the Lft lateral heel   Pt skin will remain intact and surgical incision and abrasion to lft lateral heel will remain clean, dry and free of infection  Pt will be turned and reposition Q 2hrs for comfort and skin integrity      Discharge Planning:  Plans to discharge home with spouse back to [place]. Had [facility] in the past. Awaiting therapy follow-up recommendation.   Team Discussion: Patient admitted post right AKA with history of Heart failure on high flow oxygen at baseline. Patient sedentary at baseline and limited by poor endurance.  Patient on target to meet rehab goals: yes,  after a episode of heart failure exacerbation with confusion, sundowning; medications adjusted with noted weight loss; patient is doing better. Currently needs mod - max assist for ADLs and min assist for transfers. Needs min assist for wheelchair mobility and anticipate wheelchair level at discharge. Goals for discharge set for  min assist overall.  *See Care Plan and progress notes for long and short-term goals.   Revisions to Treatment Plan:  Follow up with orthopedic MD in a couple of months re prosthetic for transfers   Teaching Needs: Safety, medications, transfers, toileting, etc.   Current Barriers to Discharge: Decreased caregiver support  Possible Resolutions to Barriers: Family education HH follow up services DME: custom wheelchair     Medical Summary Current Status: HTN, CHF, right AKA, sundowning, type 2 diabetes, pain  Barriers to Discharge: Medical stability;Complicated Wound  Barriers to Discharge Comments: HTN, CHF, right AKA,  sundowning, type 2 diabetes, pain Possible Resolutions to [employer]: continue to monitor BP TID, conitnue to monitor weight, cardiology consulted, continue to monitor incision, prn seroquel  added, shrinker ordered, continue prn insulin , continue gabapentin - decrease to HS   Continued Need for Acute Rehabilitation Level of Care: The patient requires daily medical management by a physician with specialized training in physical medicine and rehabilitation for the following reasons: Direction of a multidisciplinary physical rehabilitation program to maximize functional independence : Yes Medical management of patient stability for increased activity during participation in an intensive rehabilitation regime.: Yes Analysis of laboratory values and/or radiology reports with any subsequent need for medication adjustment and/or medical intervention. : Yes   I attest that I was present, lead the team conference, and concur with the assessment and plan of the team.   [name] 02/21/2024, 1:50 PM

## 2024-02-21 NOTE — Progress Notes (Signed)
 Nutrition Follow Up  DOCUMENTATION CODES:   Not applicable  INTERVENTION:  Encouraged adequate intake of meals and supplements to promote wound healing  Continue Ensure Max BID to help pt meet protein needs; each supplement provides 150 kcal and 30 grams of protein.   Continue Juven BID; each packet provides 95 calories, 2.5 grams of protein (collagen), and 9.8 grams of carbohydrate (3 grams sugar); also contains 7 grams of L-arginine and L-glutamine, 300 mg vitamin C , 15 mg vitamin E, 1.2 mcg vitamin B-12, 9.5 mg zinc , 200 mg calcium , and 1.5 g  Calcium  Beta-hydroxy-Beta-methylbutyrate to support wound healing  Continue Magic cup TID with meals, each supplement provides 290 kcal and 9 grams of protein  NUTRITION DIAGNOSIS:   Increased nutrient needs related to wound healing, post-op healing as evidenced by estimated needs. Remains applicable  GOAL:   Patient will meet greater than or equal to 90% of their needs Progressing  MONITOR:   PO intake, Supplement acceptance  REASON FOR ASSESSMENT:   Consult Wound healing, Diet education  ASSESSMENT:   Pt with hx of diabetes, CAD s/p CABG, PAD, HTN on 5L O2. Recent admission with worsenign R foot infection and cellulitis to mid calf. Family elected for R AKA 1/13 and pt admitted to CIR to work on functional decline.  Pt resting in bedside chair at time of assessment. Pt reports continued good appetite, eating 85-100% of meals. Pt continues to be accepting of supplements (Ensure Max, Juven, and Magic cups). No GI discomforts reported at this time.   Blood sugars still high but appear to be more managed than previously. Recommend continued carb mod diet to control carb intake of meals.   Pt reports therapy has been going well and appreciates staff efforts and support.   Average Meal Completion: 1/29: 100% average intake x 1 recorded meals 1/31-2/4: 85-100% average intake x 8 recorded meals  Nutritionally Relevant  Medications: Vitamin C  500 mg daily Vitamin D3 2000 units daily Lasix   SSI 0-5 at bedtime SSI 0-9 units TID Metformin   MVI w/minerals Juven BID Protonix  Miralax  Prednisone  Senna   Labs reviewed: CBG x 24 h: 138-186mg /dL BUN 42 J8r 6.8  Admit weight: 74.5 kg   Diet Order:   Diet Order             Diet Carb Modified Room service appropriate? Yes with Assist  Diet effective now                   EDUCATION NEEDS:   Education needs have been addressed  Skin:  Skin Assessment: Skin Integrity Issues: Skin Integrity Issues:: Incisions, Other (Comment) Incisions: R AKA Other: ulcer L foot  Last BM:  2/3 type 6  Height:   Ht Readings from Last 1 Encounters:  02/15/24 5' 8 (1.727 m)    Weight:   Wt Readings from Last 1 Encounters:  02/21/24 67 kg    Ideal Body Weight:  63 kg **adjusted for R AKA**  BMI:  Body mass index is 22.46 kg/m.  Estimated Nutritional Needs:   Kcal:  1600-1800  Protein:  110-120g  Fluid:  1.6-1.8L    Josette Glance, MS, RDN, LDN Clinical Dietitian I Please reach out via secure chat

## 2024-02-21 NOTE — Progress Notes (Signed)
 "                                                        PROGRESS NOTE   Subjective/Complaints: Patient's chart reviewed- No issues reported overnight Vitals signs stable  Weight reviewed and is decreased  ROS: denies residual limb pain, breathing has been stable   Objective:   DG Chest 2 View Result Date: 02/20/2024 CLINICAL DATA:  Shortness of breath EXAM: CHEST - 2 VIEW COMPARISON:  Chest radiograph dated 02/19/2024 FINDINGS: Slightly improved but persistently low lung volumes. Slightly decreased patchy left lower lung opacities. Persistent peripheral bilateral reticular opacities. Decreased trace bilateral pleural effusions. No pneumothorax. Similar enlarged cardiomediastinal silhouette. Aortic atherosclerosis. Median sternotomy wires are nondisplaced. Severe degenerative changes of the bilateral shoulders. IMPRESSION: 1. Slightly improved but persistently low lung volumes. 2. Slightly decreased patchy left lower lung opacities, likely atelectasis. 3. Decreased trace bilateral pleural effusions. Electronically Signed   By: Limin  Xu M.D.   On: 02/20/2024 10:46   DG Chest 2 View Result Date: 02/19/2024 CLINICAL DATA:  Short of breath status post above the knee amputation of the right lower extremity EXAM: CHEST - 2 VIEW COMPARISON:  Prior chest x-ray 01/30/2024 FINDINGS: Pulmonary vascular congestion with diffuse interstitial opacities bilaterally consistent with mild interstitial pulmonary edema. Very low inspiratory volumes. Stable cardiomegaly. Atherosclerotic calcifications present throughout the aorta. Patient is status post median sternotomy. No pneumothorax. Suspect small bilateral layering pleural effusions. No acute osseous abnormality. IMPRESSION: 1. Mild-moderate CHF. 2. Suspect small bilateral pleural effusions. Electronically Signed   By: Wilkie Lent M.D.   On: 02/19/2024 16:02   Recent Labs    02/20/24 0549 02/21/24 0426  WBC 14.1* 11.2*  HGB 12.1* 11.6*  HCT 40.0 38.7*   PLT 270 257    Recent Labs    02/20/24 0549 02/21/24 0426  NA 138 138  K 3.9 4.0  CL 96* 97*  CO2 31 31  GLUCOSE 110* 146*  BUN 35* 42*  CREATININE 0.79 0.63  CALCIUM  9.5 9.4     Intake/Output Summary (Last 24 hours) at 02/21/2024 0951 Last data filed at 02/21/2024 0800 Gross per 24 hour  Intake 714 ml  Output 1400 ml  Net -686 ml        Physical Exam: Vital Signs Blood pressure 121/73, pulse 72, temperature (!) 97.5 F (36.4 C), temperature source Oral, resp. rate 18, height 5' 8 (1.727 m), weight 67 kg, SpO2 97%. Constitutional: No distress . Vital signs reviewed. HEENT: NCAT, EOMI, oral membranes moist Neck: supple Cardiovascular: RRR without murmur. No JVD    Respiratory/Chest: CTA Bilaterally without wheezes or rales. Normal effort    GI/Abdomen: BS +, non-tender, non-distended Ext: no clubbing, cyanosis,   Psych: pleasant, a little flat but cooperative  Skin: R AKA incision c/d/I, some bruising around incision Neuro: Aox2-3 (to year but not date), b/l UE with 4/5 strength, RLE with 4/5 strength, LLE with 4/5 strength except for 2/5 DF/PF, stable 2/4 Musc: Right AKA stump with swelling. Good shape, minimally tender with palpation   Assessment/Plan: 1. Functional deficits which require 3+ hours per day of interdisciplinary therapy in a comprehensive inpatient rehab setting. Physiatrist is providing close team supervision and 24 hour management of active medical problems listed below. Physiatrist and rehab team continue to assess barriers to discharge/monitor patient  progress toward functional and medical goals  Care Tool:  Bathing    Body parts bathed by patient: Right arm, Left arm, Chest, Abdomen, Face, Left upper leg, Left lower leg, Front perineal area, Right upper leg   Body parts bathed by helper: Buttocks Body parts n/a: Right lower leg   Bathing assist Assist Level: Minimal Assistance - Patient > 75%     Upper Body Dressing/Undressing Upper  body dressing   What is the patient wearing?: Pull over shirt    Upper body assist Assist Level: Moderate Assistance - Patient 50 - 74%    Lower Body Dressing/Undressing Lower body dressing      What is the patient wearing?: Pants     Lower body assist Assist for lower body dressing: Moderate Assistance - Patient 50 - 74%     Toileting Toileting    Toileting assist Assist for toileting: Total Assistance - Patient < 25%     Transfers Chair/bed transfer  Transfers assist     Chair/bed transfer assist level: Moderate Assistance - Patient 50 - 74%     Locomotion Ambulation   Ambulation assist   Ambulation activity did not occur: Safety/medical concerns          Walk 10 feet activity   Assist  Walk 10 feet activity did not occur: Safety/medical concerns        Walk 50 feet activity   Assist Walk 50 feet with 2 turns activity did not occur: Safety/medical concerns         Walk 150 feet activity   Assist Walk 150 feet activity did not occur: Safety/medical concerns         Walk 10 feet on uneven surface  activity   Assist Walk 10 feet on uneven surfaces activity did not occur: Safety/medical concerns         Wheelchair     Assist Is the patient using a wheelchair?: Yes Type of Wheelchair: Manual    Wheelchair assist level: Minimal Assistance - Patient > 75% Max wheelchair distance: 28'    Wheelchair 50 feet with 2 turns activity    Assist        Assist Level: Minimal Assistance - Patient > 75%   Wheelchair 150 feet activity     Assist      Assist Level: Maximal Assistance - Patient 25 - 49%   Blood pressure 121/73, pulse 72, temperature (!) 97.5 F (36.4 C), temperature source Oral, resp. rate 18, height 5' 8 (1.727 m), weight 67 kg, SpO2 97%.  Medical Problem List and Plan: 1. Functional deficits secondary to Right AKA 01/30/24 due to critical limb ischemia with gangrene of the R foot              -patient may shower, cover incision             -ELOS/Goals: 18-21, PT/OT min-modA              -grounds pass ordered   --Continue CIR therapies including PT, OT   Team conference today  2.  New onset afib/aflutter: continue Eliquis   3. Pain Management: continue Ultram  prn  4. Circadian rhythm dysfunction: melatonin 1.5mg  scheduled HS. LCSW to follow for evaluation and support. Prn seroquel  ordered for agitation due to sundowning. Sleep chart d/ced  5. Neuropsych/cognition: This patient is capable of making decisions on his own behalf. 6. Skin/Wound Care: Pressure relief measures.  --  prevalon boot for  left is boggy. --pain pressure ares on left later  foot and toes with betadine .   R- BKA                        Left lateral heel          Left toes.       7. Fluids/Electrolytes/Nutrition: Strict I/O. Continue Vitamin C .              --add protein supplement.   9. R-AKA due to PAD/gangrene/ischemia: Has appt for staple removal 02/12  10.  Hypoalbuminemia: continue protein supplement  11.  T2DM with neuropathy: Monitor BS ac/hs and use SSI for elevated BS             2/2 Restarted metformin  500mg  daily 2/1.     -monitor for pattern today    -a list of foods for diabetes was provided over weekend  CBG (last 3)  Recent Labs    02/20/24 1649 02/20/24 2049 02/21/24 0537  GLUCAP 168* 186* 138*    12. CAD s/p CABG: continue Lipitor, metoprolol  and Eliquis (off ASA)  13. Chronic systolic CHF/ICM w/EF: Monitor daily weight and for signs of overload.  --Strict I/O. --Continue Lipitor, Cozaar , Eliquis . Continue to hold jardiance to promote wound healing.  Cardiology consulted given pleural effusions on CXR, rising BUN with demadex  -weight reviewed and is decreased  14.  Interstitial lung disease w/chronic hypoxic RF: Recommendations to resume Cellcept after dc. continue Prednisone  30 mg and Bactrim  DS 3 X /wk             --Dependent of 5 L oxygen per [place] at baseline- continue  -pt  with crackles at bases on exam--encourage IS, OOB  15. Leucocytosis: WBC reviewed and is increased. Repeat ordered for tomorrow, procalcitonin ordered for today  16. Pre-renal azotemia: Cr reviewed and is stable  17. Anemia of chronic disease: Hgb reviewed and is improving  18. GERD: continue PPI     LOS: 7 days A FACE TO FACE EVALUATION WAS PERFORMED  Sven SQUIBB Jaslen Adcox 02/21/2024, 9:51 AM     "

## 2024-02-21 NOTE — Progress Notes (Signed)
 Physical Therapy Session Note  Patient Details  Name: Reginald [name] MRN: [id number] Date of Birth: [birth year]  Today's Date: 02/21/2024 PT Individual Time: 9154-9044 + [id number] PT Individual Time Calculation (min): 70 min  + 57 min  Short Term Goals: Week 1:  PT Short Term Goal 1 (Week 1): Pt will perform bed mobility with CGA. PT Short Term Goal 2 (Week 1): Pt will complete sit to stand with modA. PT Short Term Goal 3 (Week 1): Pt will complete bed to chair with modA consistently. PT Short Term Goal 4 (Week 1): Pt will self propel WC x100' wiht BUEs and minA.  Skilled Therapeutic Interventions/Progress Updates:      1st session: Pt presents in wheelchair to start - agreeable to therapy treatment. Reports minimal soreness in his R AKA - likely due to new shrinker. Pt on 5L wall O2 and on portable O2 during session.   Pt propelled himself in manual w/c 1' with minA for keeping straight path and for managing doorways. Poor stroke effiency, push's from front of rims instead of back.   Retrieved and switched him from manual w/c to K0005 lightweight. MaxA for squat pivot transfer with +2 for cushion transfer. Adjusted tension adjustable back and leg rests - unable to lower adjustable arm rests as they make contact with wheels if lowered.   Pt propels this K0005 wheelchair with improved speed and efficiency, education provided on hand positioning on rims for propulsion. Pt still quick to fatigue. Keeps eyes closed for most of session,.   Squat pivot transfer with maxA from w/c to mat table, needed a new sock on his LLE to help prevent LLE from sliding while attempting to stand/squat transfer. Able to sit EOM with supervision, completed push/pull's with dowel rod against PT manual resistance. 3x10 with rest breaks.   Squat pivot with maxA towards his L side from mat table to w/c. Returned to his room and left sitting in w/c with seat belt alarm on and call bell in lap. Personal items within  reach.    2nd session: Pt resting in bed with his wife at the bedside. Pt denies any pain.   Lengthy conversation held re: DC Planning, PT goals, PT POC, DC date, and anticipated DME rec's. Discussed custom wheelchair fitting process with loaner wheelchair. 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 Family also asking if sutures will be removed prior to DC - relayed question to MD via secure chat.   Medical team asking for actual weight. Weight measured while sitting on wheelchair. (W.c weight measured before weighing, 39.4#). Pt assisted to EOB with CGA. Completed modA squat pivot transfer to wheelchair. Pt wheeled onto scale and weight taken (subtracted weight of w/c). Team made aware of weight.   Pt taken to ortho gym at w/c level. Pt positioned at UE ergometer nustep at w/c level. Completed L1 resistance for 4 minutes total, intermittent rest breaks as needed.   Pt returned to his room - requested to stay sitting up in the wheelchair for dinner. Wife at the bedside. All needs met at end, reconnected to wall O2.   Therapy Documentation Precautions:  Precautions Precautions: Fall Precaution/Restrictions Comments: R AKA NWB; 5L O2 [place] (chronic ~1 yr) Restrictions Weight Bearing Restrictions Per Provider Order: Yes RLE Weight Bearing Per Provider Order: Non weight bearing General:     Therapy/Group: Individual Therapy  Sherlean SHAUNNA Perks 02/21/2024,  7:47 AM

## 2024-02-21 NOTE — Progress Notes (Signed)
 "  Rounding Note   Patient Name: Reginald [name] Date of Encounter: 02/21/2024  [facility] Cardiologist: None   Subjective - No acute events overnight - Patient feels well today  Scheduled Meds:  apixaban   5 mg Oral BID   arformoterol   15 mcg Nebulization BID   ascorbic acid   500 mg Oral Q1400   atorvastatin   40 mg Oral Q lunch   cholecalciferol   2,000 Units Oral Q1400   fenofibrate   54 mg Oral Daily   furosemide   40 mg Oral Daily   gabapentin   300 mg Oral BID   insulin  aspart  0-5 Units Subcutaneous QHS   insulin  aspart  0-9 Units Subcutaneous TID WC   ipratropium  2 spray Each Nare TID   losartan   12.5 mg Oral Daily   melatonin  1.5 mg Oral QHS   metFORMIN   500 mg Oral Q breakfast   metoprolol  succinate  12.5 mg Oral Daily   multivitamin with minerals  1 tablet Oral Daily   nutrition supplement (JUVEN)  1 packet Oral BID BM   pantoprazole   40 mg Oral Daily   polyethylene glycol  17 g Oral Daily   povidone-iodine    Topical BID   predniSONE   30 mg Oral Q breakfast   Ensure Max Protein  11 oz Oral BID   revefenacin   175 mcg Nebulization Daily   senna  2 tablet Oral Daily   sulfamethoxazole -trimethoprim   1 tablet Oral Once per day on Monday Wednesday Friday   Continuous Infusions:  PRN Meds: acetaminophen , alum & mag hydroxide-simeth, bisacodyl , diphenhydrAMINE , guaiFENesin -dextromethorphan, LORazepam , mouth rinse, [DISCONTINUED] prochlorperazine  **OR** prochlorperazine  **OR** prochlorperazine , QUEtiapine , sodium phosphate , traMADol    Vital Signs  Vitals:   02/20/24 1439 02/20/24 2042 02/20/24 2152 02/21/24 0415  BP: (!) 131/95 103/66  121/73  Pulse: 62 73  72  Resp: 18 18  18   Temp: 98.6 F (37 C) 97.7 F (36.5 C)  (!) 97.5 F (36.4 C)  TempSrc: Oral Oral  Oral  SpO2: 95% 97% 96% 97%  Weight:    67 kg  Height:        Intake/Output Summary (Last 24 hours) at 02/21/2024 0734 Last data filed at 02/21/2024 0514 Gross per 24 hour  Intake 600 ml  Output 1400  ml  Net -800 ml      02/21/2024    4:15 AM 02/20/2024    5:00 AM 02/19/2024    4:46 AM  Last 3 Weights  Weight (lbs) 147 lb 11.3 oz 167 lb 8.8 oz 168 lb 10.4 oz  Weight (kg) 67 kg 76 kg 76.5 kg      Telemetry No telemetry  ECG  No new ECG  Physical Exam  GEN: No acute distress.   Neck: No JVD Cardiac: Regular rate with irregularly irregular rhythm, no murmurs, rubs, or gallops.  Respiratory: Coarse inspiratory crackles heard throughout lung fields due to ILD, no wheezes or rhonchi  GI: Soft, nontender, non-distended  MS: No edema; R BKA Neuro:  Nonfocal  Psych: Normal affect   Labs High Sensitivity Troponin:  No results for input(s): TROPONINIHS in the last 720 hours. No results for input(s): TRNPT in the last 720 hours.     Chemistry Recent Labs  Lab 02/15/24 0429 02/19/24 0441 02/20/24 0549  NA 135 135 138  K 4.8 4.2 3.9  CL 100 96* 96*  CO2 25 31 31   GLUCOSE 84 125* 110*  BUN 22 37* 35*  CREATININE 0.63 0.80 0.79  CALCIUM  9.0  9.3 9.5  PROT 5.8*  --   --   ALBUMIN  3.4*  --   --   AST 19  --   --   ALT 19  --   --   ALKPHOS 70  --   --   BILITOT 0.5  --   --   GFRNONAA >60 >60 >60  ANIONGAP 10 8 11     Lipids No results for input(s): CHOL, TRIG, HDL, LABVLDL, LDLCALC, CHOLHDL in the last 168 hours.  Hematology Recent Labs  Lab 02/19/24 0441 02/20/24 0549 02/21/24 0426  WBC 12.1* 14.1* 11.2*  RBC 4.33 4.82 4.64  HGB 10.7* 12.1* 11.6*  HCT 36.3* 40.0 38.7*  MCV 83.8 83.0 83.4  MCH 24.7* 25.1* 25.0*  MCHC 29.5* 30.3 30.0  RDW 18.0* 18.3* 18.0*  PLT 254 270 257   Thyroid No results for input(s): TSH, FREET4 in the last 168 hours.  BNP Recent Labs  Lab 02/20/24 0549  PROBNP 1,624.0*    DDimer No results for input(s): DDIMER in the last 168 hours.   Radiology  DG Chest 2 View Result Date: 02/20/2024 CLINICAL DATA:  Shortness of breath EXAM: CHEST - 2 VIEW COMPARISON:  Chest radiograph dated 02/19/2024 FINDINGS: Slightly  improved but persistently low lung volumes. Slightly decreased patchy left lower lung opacities. Persistent peripheral bilateral reticular opacities. Decreased trace bilateral pleural effusions. No pneumothorax. Similar enlarged cardiomediastinal silhouette. Aortic atherosclerosis. Median sternotomy wires are nondisplaced. Severe degenerative changes of the bilateral shoulders. IMPRESSION: 1. Slightly improved but persistently low lung volumes. 2. Slightly decreased patchy left lower lung opacities, likely atelectasis. 3. Decreased trace bilateral pleural effusions. Electronically Signed   By: Limin  Xu M.D.   On: 02/20/2024 10:46   DG Chest 2 View Result Date: 02/19/2024 CLINICAL DATA:  Short of breath status post above the knee amputation of the right lower extremity EXAM: CHEST - 2 VIEW COMPARISON:  Prior chest x-ray 01/30/2024 FINDINGS: Pulmonary vascular congestion with diffuse interstitial opacities bilaterally consistent with mild interstitial pulmonary edema. Very low inspiratory volumes. Stable cardiomegaly. Atherosclerotic calcifications present throughout the aorta. Patient is status post median sternotomy. No pneumothorax. Suspect small bilateral layering pleural effusions. No acute osseous abnormality. IMPRESSION: 1. Mild-moderate CHF. 2. Suspect small bilateral pleural effusions. Electronically Signed   By: Wilkie Lent M.D.   On: 02/19/2024 16:02    Patient Profile   Reginald [name] is a 89 y.o. 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  Assessment & Plan    #Acute on Chronic HFrEF #Severe RV Dysfunction - Patient noted to be volume up since partaking in rehab. - He is not terribly symptomatic and has been diuresing well. - His JVD is now resolved and he has no lower extremity edema. -  His oxygen requirements the same. - I agree that he will need standing diuretic now that he has blood pressure room to tolerate it. - No additional workup required at this time. Continue Lasix  40 mg daily Continue losartan  12.5 mg daily Continue metoprolol  succinate 12.5 mg daily  #New Atrial Flutter w/ RVR - Stable heart rates and tolerating medications well. - Planning for outpatient cardioversion to avoid TEE given his respiratory failure from ILD. Continue Eliquis  5 mg twice daily without interruption for 3 weeks Continue metoprolol     [facility] will sign off.   Medication Recommendations: Continue currently prescribed cardiac medications Other recommendations (labs, testing, etc):  N/A Follow up as an outpatient: Patient will follow-up with [facility] cardiology, but this will have to be arranged by the patient/primary team For questions or updates, please contact [facility] Please consult [email or web address] for contact info under       Signed, Georganna Archer, MD  02/21/2024, 7:34 AM    "

## 2024-02-21 NOTE — Progress Notes (Signed)
 Occupational Therapy Session Note  Patient Details  Name: Reginald [name] MRN: [id number] Date of Birth: 18-Jan-1935  Today's Date: 02/16/2024 OT Individual Time: 9199-9154 OT Treatment Time:  45 min      Short Term Goals: Week 1:  OT Short Term Goal 1 (Week 1): Pt will perform LB hiking with Mod A (x1) + PRN compensatory techniques. OT Short Term Goal 1 - Progress (Week 1): Progressing toward goal OT Short Term Goal 2 (Week 1): Pt will perform toilet transfer with Mod A (x1) + LRAD. OT Short Term Goal 2 - Progress (Week 1): Not met OT Short Term Goal 3 (Week 1): Pt will participate in >10 mins of activity with x1 rest break as a means of improving activity tolerance. OT Short Term Goal 3 - Progress (Week 1): Met  Skilled Therapeutic Interventions/Progress Updates:   Patient received supine in bed, reports no pain.  Patient needing Min assist to roll left and right and to complete 1/2 bridge for LB dressing.  Patient lethargic, but pleasant.  Patient asking MD about having ice cream for breakfast.   Patient needing mod initially then min assistance in sitting to reduce backward loss of balance.  Placing foot on floor helpful to stabilize sitting balance.   Patient able to transfer from bed to wheelchair with mod assist toward left. Patient left up in chair with safety belt in place and engaged and call bell in reach.  Therapy Documentation Precautions:  Precautions Precautions: Fall Precaution/Restrictions Comments: R AKA NWB; 5L O2 [place] (chronic ~1 yr) Restrictions Weight Bearing Restrictions Per Provider Order: Yes RLE Weight Bearing Per Provider Order: Non weight bearing   Pain:  Denies pain     Therapy/Group: Individual Therapy  Delos Klich M 02/21/2024, 10:21 AM

## 2024-02-21 NOTE — Plan of Care (Signed)
" °  Problem: Consults Goal: RH LIMB LOSS PATIENT EDUCATION Description: Description: See Patient Education module for eduction specifics. Outcome: Progressing Goal: Skin Care Protocol Initiated - if Braden Score 18 or less Description: If consults are not indicated, leave blank or document N/A Outcome: Progressing Goal: Nutrition Consult-if indicated Outcome: Progressing Goal: Diabetes Guidelines if Diabetic/Glucose > 140 Description: If diabetic or lab glucose is > 140 mg/dl - Initiate Diabetes/Hyperglycemia Guidelines & Document Interventions  Outcome: Progressing   Problem: RH BOWEL ELIMINATION Goal: RH STG MANAGE BOWEL WITH ASSISTANCE Description: STG Manage Bowel with Assistance. Outcome: Progressing Goal: RH STG MANAGE BOWEL W/MEDICATION W/ASSISTANCE Description: STG Manage Bowel with Medication with Assistance. Outcome: Progressing   Problem: RH BLADDER ELIMINATION Goal: RH STG MANAGE BLADDER WITH ASSISTANCE Description: STG Manage Bladder With Assistance Outcome: Progressing   Problem: RH SKIN INTEGRITY Goal: RH STG MAINTAIN SKIN INTEGRITY WITH ASSISTANCE Description: STG Maintain Skin Integrity With Assistance. Outcome: Progressing Goal: RH STG ABLE TO PERFORM INCISION/WOUND CARE W/ASSISTANCE Description: STG Able To Perform Incision/Wound Care With Assistance. Outcome: Progressing   Problem: RH SAFETY Goal: RH STG ADHERE TO SAFETY PRECAUTIONS W/ASSISTANCE/DEVICE Description: STG Adhere to Safety Precautions With Assistance/Device. Outcome: Progressing Goal: RH STG DECREASED RISK OF FALL WITH ASSISTANCE Description: STG Decreased Risk of Fall With Assistance. Outcome: Progressing   Problem: RH PAIN MANAGEMENT Goal: RH STG PAIN MANAGED AT OR BELOW PT'S PAIN GOAL Outcome: Progressing   Problem: RH KNOWLEDGE DEFICIT LIMB LOSS Goal: RH STG INCREASE KNOWLEDGE OF SELF CARE AFTER LIMB LOSS Outcome: Progressing   "

## 2024-02-21 NOTE — Progress Notes (Signed)
 Occupational Therapy Note  Patient Details  Name: WILIAM CAUTHORN MRN: [id number] Date of Birth: 1935-05-28  Occupational Therapist participated in the interdisciplinary team conference, providing clinical information regarding the patient's current status, treatment goals, and weekly focus, including any barriers that need to be addressed. Please see the Inpatient Rehabilitation Team Conference and Plan of Care Update for further details   Fransico Allean HERO 02/21/2024, 11:26 AM

## 2024-02-22 LAB — GLUCOSE, CAPILLARY
Glucose-Capillary: 130 mg/dL — ABNORMAL HIGH (ref 70–99)
Glucose-Capillary: 127 mg/dL — ABNORMAL HIGH (ref 70–99)
Glucose-Capillary: 205 mg/dL — ABNORMAL HIGH (ref 70–99)
Glucose-Capillary: 178 mg/dL — ABNORMAL HIGH (ref 70–99)

## 2024-02-22 MED ORDER — QUETIAPINE FUMARATE 25 MG PO TABS
25.0000 mg | ORAL_TABLET | Freq: Every day | ORAL | Status: AC
  Administered 2024-02-22 – 2024-02-23 (×2): 25 mg via ORAL
  Filled 2024-02-22 (×2): qty 1

## 2024-02-22 NOTE — Progress Notes (Incomplete)
 Occupational Therapy Session Note  Patient Details  Name: Reginald [name] MRN: [id number] Date of Birth: 14-Dec-1935  Today's Date: 02/22/2024 OT Individual Time: 9099-9064 OT Individual Time Calculation (min): 35 min    Short Term Goals: Week 2:  OT Short Term Goal 1 (Week 2): Patient will complete an active transfer to toilet with mod assist or less OT Short Term Goal 2 (Week 2): Patient will complete toileting with mod assist OT Short Term Goal 3 (Week 2): Patient will don elastic wasit pants with min assist Week 3:     Skilled Therapeutic Interventions/Progress Updates:    1:1 Pt received sitting up in the bed. Participated in bathing and dressing. Pt able to wash periarea and buttocks with setup with rolling with supervision and bed rail in the bed. Clean brief donned. Pt able to come to EOB with min A to sitting EOB to don clothing. Pt with difficulty with orienting shirt and getting arms in his arm holes- requiring max A. Pt able to thread his shorts with min A for dynamic balance on air bed. Lateral leans to pull up pants with mod A. Pt performed squat pivot with max A into newer light weight chair that has a lower floor to seat height. With setup at the sink for oral care and washing face.  Pt left sitting up in the w/c  Therapy Documentation Precautions:  Precautions Precautions: Fall Precaution/Restrictions Comments: R AKA NWB; 5L O2 [place] (chronic ~1 yr) Restrictions Weight Bearing Restrictions Per Provider Order: Yes RLE Weight Bearing Per Provider Order: Non weight bearing  Pain: Pain Assessment Pain Scale: 0-10 Pain Score: 3  Pain Type: Acute pain Pain Location: Leg Pain Orientation: Right when donning shrinker. Relief once donned    Therapy/Group: Individual Therapy  Claudene Nest [facility] 02/22/2024, 10:47 AM

## 2024-02-22 NOTE — Progress Notes (Signed)
 Patient ID: Reginald [name], male   DOB: 06-25-1935, 89 y.o.   MRN: [id number]  Have reviewed team conference with pt and family. Both aware and agreeable with targeted d/c date of 02/17 and goals of Minimal Assistance - Patient > 75%.   Will try re-establishing HH with [facility].   Will order DME as applicable.   May need transportation assistance if unable to transport in the car.

## 2024-02-22 NOTE — Progress Notes (Signed)
 Patient ID: Reginald [name], male   DOB: 1935-06-27, 89 y.o.   MRN: [id number]  Will work to establish [facility] with [facility]

## 2024-02-22 NOTE — Progress Notes (Signed)
 Group Therapy Note  Patient Details  Name: Reginald [name] MRN: [id number] Date of Birth: 07/19/1935 Today's Date: 02/22/2024   Pt initially present for limb loss group session but needed to toilet within first few minutes and returned to room. OT then arrived for scheduled session. Therefore pt missed 60 minutes of group therapy session.   [name] [name] PT, DPT 02/22/2024, 12:27 PM

## 2024-02-22 NOTE — Progress Notes (Signed)
 Occupational Therapy Session Note  Patient Details  Name: Reginald [name] MRN: [id number] Date of Birth: [birth year]  Today's Date: 02/22/2024 OT Individual Time: 1345-1415 OT Individual Time Calculation (min): 30 min    Short Term Goals: Week 2:  OT Short Term Goal 1 (Week 2): Patient will complete an active transfer to toilet with mod assist or less OT Short Term Goal 2 (Week 2): Patient will complete toileting with mod assist OT Short Term Goal 3 (Week 2): Patient will don elastic wasit pants with min assist  Skilled Therapeutic Interventions/Progress Updates:   Patient presents sidelying in bed.  Wife at bedside - indicated he rallied enough to eat lunch.  Patient awake at times during this session.   Wife reports patient with burning / itching on scrotum.  In attempting to check patient's skin, patient indicated need to void.  Patient unable to stay awake long enough to urinate.  Brief soaked - changed into clean and dry brief.  Applied cream to scrotum.  Patient unable to follow instructions well this afternoon.  Messaged medical team regarding wife's concerns relating to Ativan  at night.    Therapy Documentation Precautions:  Precautions Precautions: Fall Precaution/Restrictions Comments: R AKA NWB; 5L O2 [place] (chronic ~1 yr) Restrictions Weight Bearing Restrictions Per Provider Order: Yes RLE Weight Bearing Per Provider Order: Non weight bearing General: General PT Missed Treatment Reason: Other (Comment) (toileting and OT session) Pain:  Denies pain  Therapy/Group: Individual Therapy  Dorr Perrot M 02/22/2024, 2:35 PM

## 2024-02-22 NOTE — Progress Notes (Signed)
 Physical Therapy Session Note  Patient Details  Name: HERSH MINNEY MRN: [id number] Date of Birth: [birth year]  Today's Date: 02/22/2024 PT Missed Time: 75 Minutes Missed Time Reason: Other (Comment) (toileting and OT session)  Short Term Goals: Week 1:  PT Short Term Goal 1 (Week 1): Pt will perform bed mobility with CGA. PT Short Term Goal 2 (Week 1): Pt will complete sit to stand with modA. PT Short Term Goal 3 (Week 1): Pt will complete bed to chair with modA consistently. PT Short Term Goal 4 (Week 1): Pt will self propel WC x100' wiht BUEs and minA.  Skilled Therapeutic Interventions/Progress Updates:      Pt resting in bed, wife at bedside. Pt somewhat restless, eyes closed. Per team, patient having a difficult day with staying awake. Pt not in a state to participate in physical therapy services. Missed 75 minutes of therapy.    Therapy Documentation Precautions:  Precautions Precautions: Fall Precaution/Restrictions Comments: R AKA NWB; 5L O2 [place] (chronic ~1 yr) Restrictions Weight Bearing Restrictions Per Provider Order: Yes RLE Weight Bearing Per Provider Order: Non weight bearing General:     Therapy/Group: Individual Therapy  Sherlean SHAUNNA Perks 02/22/2024, 7:55 AM

## 2024-02-22 NOTE — Progress Notes (Signed)
 Occupational Therapy Session Note  Patient Details  Name: Reginald [name] MRN: [id number] Date of Birth: [birth year]  Today's Date: 02/22/2024 OT Individual Time: 1100-1200 OT Individual Time Calculation (min): 60 min    Short Term Goals: Week 1:  OT Short Term Goal 1 (Week 1): Pt will perform LB hiking with Mod A (x1) + PRN compensatory techniques. OT Short Term Goal 1 - Progress (Week 1): Progressing toward goal OT Short Term Goal 2 (Week 1): Pt will perform toilet transfer with Mod A (x1) + LRAD. OT Short Term Goal 2 - Progress (Week 1): Not met OT Short Term Goal 3 (Week 1): Pt will participate in >10 mins of activity with x1 rest break as a means of improving activity tolerance. OT Short Term Goal 3 - Progress (Week 1): Met  Skilled Therapeutic Interventions/Progress Updates:   Patient received as handoff from nurse and TR, assisting patient back to bed.  Patient needs total assist for placement of slide board.  Patient with slighlty uphill transfer from lower wheelchair. Patient needing simple overt cueing to participate in slide board transfer - this is not a new skill, yet patient minimally participative.  Max assist to transfer back to bed - second person to stabilize transfer board.   [name] here - used this time to discuss home situation.  Wife clarified that bedroom is on main level (patient indicated he slept in recliner because bedroom was upstairs.  Wife shared that they were remodeling a first floor bathroom to allow him wheelchair access.  Wife also indicates that they have long term care insurance and she is anticipating needing trained assistants at home post discharge.   Patient able to roll left and right with mod cueing and mod/max assist to change into a clean brief.  Patient left in bed, supine, with bed alarm engaged and call bell in reach and wife at bedside.    Therapy Documentation Precautions:  Precautions Precautions: Fall Precaution/Restrictions Comments: R  AKA NWB; 5L O2 [place] (chronic ~1 yr) Restrictions Weight Bearing Restrictions Per Provider Order: Yes RLE Weight Bearing Per Provider Order: Non weight bearing  Vital Signs: 02 sat 99% w/ O2 via [place].  Breathing - panting while sleeping supine. Wife indicates this is not unusual for him.   Pain: Denies pain    Therapy/Group: Individual Therapy  Yenesis Even M 02/22/2024, 12:32 PM

## 2024-02-22 NOTE — Progress Notes (Signed)
 "                                                        PROGRESS NOTE   Subjective/Complaints: No new complaints this morning Patient's chart reviewed- was agitated overnight Vitals signs stable   ROS: denies residual limb pain, breathing has been stable, +nighttime agitation as per nursing   Objective:   No results found.  Recent Labs    02/20/24 0549 02/21/24 0426  WBC 14.1* 11.2*  HGB 12.1* 11.6*  HCT 40.0 38.7*  PLT 270 257    Recent Labs    02/20/24 0549 02/21/24 0426  NA 138 138  K 3.9 4.0  CL 96* 97*  CO2 31 31  GLUCOSE 110* 146*  BUN 35* 42*  CREATININE 0.79 0.63  CALCIUM  9.5 9.4     Intake/Output Summary (Last 24 hours) at 02/22/2024 1043 Last data filed at 02/22/2024 0845 Gross per 24 hour  Intake 1426 ml  Output 400 ml  Net 1026 ml        Physical Exam: Vital Signs Blood pressure 126/87, pulse 89, temperature 97.6 F (36.4 C), temperature source Oral, resp. rate 18, height 5' 8 (1.727 m), weight 68 kg, SpO2 95%. Constitutional: No distress . Vital signs reviewed. HEENT: NCAT, EOMI, oral membranes moist Neck: supple Cardiovascular: RRR without murmur. No JVD    Respiratory/Chest: CTA Bilaterally without wheezes or rales. Normal effort    GI/Abdomen: BS +, non-tender, non-distended Ext: no clubbing, cyanosis,   Psych: pleasant, a little flat but cooperative  Skin: R AKA incision c/d/I, some bruising around incision Neuro: Aox2-3 (to year but not date), b/l UE with 4/5 strength, RLE with 4/5 strength, LLE with 4/5 strength except for 2/5 DF/PF, stable 2/5 Musc: Right AKA stump with swelling. Good shape, minimally tender with palpation   Assessment/Plan: 1. Functional deficits which require 3+ hours per day of interdisciplinary therapy in a comprehensive inpatient rehab setting. Physiatrist is providing close team supervision and 24 hour management of active medical problems listed below. Physiatrist and rehab team continue to assess barriers  to discharge/monitor patient progress toward functional and medical goals  Care Tool:  Bathing    Body parts bathed by patient: Right arm, Left arm, Chest, Abdomen, Face, Left upper leg, Left lower leg, Front perineal area, Right upper leg   Body parts bathed by helper: Buttocks Body parts n/a: Right lower leg   Bathing assist Assist Level: Minimal Assistance - Patient > 75%     Upper Body Dressing/Undressing Upper body dressing   What is the patient wearing?: Pull over shirt    Upper body assist Assist Level: Moderate Assistance - Patient 50 - 74%    Lower Body Dressing/Undressing Lower body dressing      What is the patient wearing?: Pants     Lower body assist Assist for lower body dressing: Moderate Assistance - Patient 50 - 74%     Toileting Toileting    Toileting assist Assist for toileting: Total Assistance - Patient < 25%     Transfers Chair/bed transfer  Transfers assist     Chair/bed transfer assist level: Moderate Assistance - Patient 50 - 74%     Locomotion Ambulation   Ambulation assist   Ambulation activity did not occur: Safety/medical concerns  Walk 10 feet activity   Assist  Walk 10 feet activity did not occur: Safety/medical concerns        Walk 50 feet activity   Assist Walk 50 feet with 2 turns activity did not occur: Safety/medical concerns         Walk 150 feet activity   Assist Walk 150 feet activity did not occur: Safety/medical concerns         Walk 10 feet on uneven surface  activity   Assist Walk 10 feet on uneven surfaces activity did not occur: Safety/medical concerns         Wheelchair     Assist Is the patient using a wheelchair?: Yes Type of Wheelchair: Manual    Wheelchair assist level: Minimal Assistance - Patient > 75% Max wheelchair distance: 41'    Wheelchair 50 feet with 2 turns activity    Assist        Assist Level: Minimal Assistance - Patient > 75%    Wheelchair 150 feet activity     Assist      Assist Level: Maximal Assistance - Patient 25 - 49%   Blood pressure 126/87, pulse 89, temperature 97.6 F (36.4 C), temperature source Oral, resp. rate 18, height 5' 8 (1.727 m), weight 68 kg, SpO2 95%.  Medical Problem List and Plan: 1. 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  New onset afib/aflutter: continue Eliquis   3. Pain Management: conitnue Ultram  prn  4. Circadian rhythm dysfunction: melatonin d/ced as seems to be worsening agitation.  LCSW to follow for evaluation and support. 25mg  Seroquel  scheduled HS for agitation due to sundowning. Sleep chart d/ced  5. Neuropsych/cognition: This patient is capable of making decisions on his own behalf. 6. Skin/Wound Care: Pressure relief measures.  --  prevalon boot for  left is boggy. --pain pressure ares on left later foot and toes with betadine .   R- BKA                        Left lateral heel          Left toes.       7. Fluids/Electrolytes/Nutrition: Strict I/O. Continue Vitamin C .              --add protein supplement.   9. R-AKA due to PAD/gangrene/ischemia: Has appt for staple removal 02/12  10.  Hypoalbuminemia: continue protein supplement  11.  T2DM with neuropathy: Monitor BS ac/hs and use SSI for elevated BS             2/2 Restarted metformin  500mg  daily 2/1.     -monitor for pattern today    -a list of foods for diabetes was provided over weekend  CBG (last 3)  Recent Labs    02/21/24 1705 02/21/24 2123 02/22/24 0557  GLUCAP 256* 122* 130*    12. CAD s/p CABG: continue Lipitor, metoprolol  and Eliquis (off ASA)  13. Chronic systolic CHF/ICM w/EF: Monitor daily weight and for signs of  overload.  --Strict I/O. --Continue Lipitor, Cozaar , Eliquis . Continue  to hold jardiance to promote wound healing.  Cardiology consulted given pleural effusions on CXR, rising BUN with demadex  -weight reviewed and is decreased  14.  Interstitial lung disease w/chronic hypoxic RF: Recommendations to resume Cellcept after dc. continue Prednisone  30 mg and Bactrim  DS 3 X /wk             --Dependent of 5 L oxygen per [place] at baseline- continue  -pt with crackles at bases on exam--encourage IS, OOB  15. Leucocytosis: WBC reviewed and is increased. Repeat ordered for tomorrow, procalcitonin ordered for today  16. Pre-renal azotemia: Cr reviewed and is stable  17. Anemia of chronic disease: Hgb reviewed and is improving  18. GERD: continue PPI     LOS: 8 days A FACE TO FACE EVALUATION WAS PERFORMED  Sven SQUIBB Eternity Dexter 02/22/2024, 10:43 AM     "

## 2024-02-22 NOTE — Progress Notes (Signed)
 Pt remained awake and agitated throughout shift despite receiving prn medication. Pt also refused am lab draw. Unable to redirect or reason with pt

## 2024-02-22 NOTE — Plan of Care (Signed)
" °  Problem: Consults Goal: RH LIMB LOSS PATIENT EDUCATION Description: Description: See Patient Education module for eduction specifics. Outcome: Progressing Goal: Skin Care Protocol Initiated - if Braden Score 18 or less Description: If consults are not indicated, leave blank or document N/A Outcome: Progressing Goal: Nutrition Consult-if indicated Outcome: Progressing Goal: Diabetes Guidelines if Diabetic/Glucose > 140 Description: If diabetic or lab glucose is > 140 mg/dl - Initiate Diabetes/Hyperglycemia Guidelines & Document Interventions  Outcome: Progressing   Problem: RH BOWEL ELIMINATION Goal: RH STG MANAGE BOWEL WITH ASSISTANCE Description: STG Manage Bowel with Assistance. Outcome: Progressing Goal: RH STG MANAGE BOWEL W/MEDICATION W/ASSISTANCE Description: STG Manage Bowel with Medication with Assistance. Outcome: Progressing   Problem: RH BLADDER ELIMINATION Goal: RH STG MANAGE BLADDER WITH ASSISTANCE Description: STG Manage Bladder With Assistance Outcome: Progressing   Problem: RH SKIN INTEGRITY Goal: RH STG MAINTAIN SKIN INTEGRITY WITH ASSISTANCE Description: STG Maintain Skin Integrity With Assistance. Outcome: Progressing Goal: RH STG ABLE TO PERFORM INCISION/WOUND CARE W/ASSISTANCE Description: STG Able To Perform Incision/Wound Care With Assistance. Outcome: Progressing   Problem: RH SAFETY Goal: RH STG ADHERE TO SAFETY PRECAUTIONS W/ASSISTANCE/DEVICE Description: STG Adhere to Safety Precautions With Assistance/Device. Outcome: Progressing Goal: RH STG DECREASED RISK OF FALL WITH ASSISTANCE Description: STG Decreased Risk of Fall With Assistance. Outcome: Progressing   Problem: RH PAIN MANAGEMENT Goal: RH STG PAIN MANAGED AT OR BELOW PT'S PAIN GOAL Outcome: Progressing   Problem: RH KNOWLEDGE DEFICIT LIMB LOSS Goal: RH STG INCREASE KNOWLEDGE OF SELF CARE AFTER LIMB LOSS Outcome: Progressing   "

## 2024-02-23 LAB — GLUCOSE, CAPILLARY
Glucose-Capillary: 141 mg/dL — ABNORMAL HIGH (ref 70–99)
Glucose-Capillary: 159 mg/dL — ABNORMAL HIGH (ref 70–99)
Glucose-Capillary: 226 mg/dL — ABNORMAL HIGH (ref 70–99)
Glucose-Capillary: 144 mg/dL — ABNORMAL HIGH (ref 70–99)

## 2024-02-23 MED ORDER — METOPROLOL TARTRATE 12.5 MG HALF TABLET
12.5000 mg | ORAL_TABLET | Freq: Once | ORAL | Status: AC
  Administered 2024-02-23: 12.5 mg via ORAL
  Filled 2024-02-23: qty 1

## 2024-02-23 MED ORDER — PREDNISONE 20 MG PO TABS: 20.0000 mg | ORAL_TABLET | Freq: Every day | ORAL | Status: AC

## 2024-02-23 MED ORDER — DOXYCYCLINE HYCLATE 100 MG PO TABS
100.0000 mg | ORAL_TABLET | Freq: Two times a day (BID) | ORAL | Status: AC
  Administered 2024-02-23: 100 mg via ORAL
  Filled 2024-02-23: qty 1

## 2024-02-23 MED ORDER — PREDNISONE 5 MG PO TABS: 10.0000 mg | ORAL_TABLET | Freq: Every day | ORAL | Status: AC

## 2024-02-23 MED ORDER — FUROSEMIDE 20 MG PO TABS: 20.0000 mg | ORAL_TABLET | Freq: Every day | ORAL | Status: AC

## 2024-02-23 NOTE — Progress Notes (Signed)
 IV team consulted to start new IV. No indication identified at this time. MD Raulker agrees to abstain from IV starts at this time. Consult completed.

## 2024-02-23 NOTE — Progress Notes (Signed)
 "                                                        PROGRESS NOTE   Subjective/Complaints: No new complaints this morning Patient's chart reviewed- No issues reported overnight Vitals signs stable   ROS: denies residual limb pain, breathing has been stable, +nighttime agitation as per nursing   Objective:   No results found.  Recent Labs    02/21/24 0426  WBC 11.2*  HGB 11.6*  HCT 38.7*  PLT 257    Recent Labs    02/21/24 0426  NA 138  K 4.0  CL 97*  CO2 31  GLUCOSE 146*  BUN 42*  CREATININE 0.63  CALCIUM  9.4     Intake/Output Summary (Last 24 hours) at 02/23/2024 1238 Last data filed at 02/23/2024 1229 Gross per 24 hour  Intake 940 ml  Output 775 ml  Net 165 ml        Physical Exam: Vital Signs Blood pressure 121/76, pulse 74, temperature 97.6 F (36.4 C), temperature source Oral, resp. rate 19, height 5' 8 (1.727 m), weight 70 kg, SpO2 99%. Constitutional: No distress . Vital signs reviewed. HEENT: NCAT, EOMI, oral membranes moist Neck: supple Cardiovascular: RRR without murmur. No JVD    Respiratory/Chest: CTA Bilaterally without wheezes or rales. Normal effort    GI/Abdomen: BS +, non-tender, non-distended Ext: no clubbing, cyanosis,   Psych: pleasant, a little flat but cooperative  Skin: R AKA incision c/d/I, some bruising around incision Neuro: Aox2-3 (to year but not date), b/l UE with 4/5 strength, RLE with 4/5 strength, LLE with 4/5 strength except for 2/5 DF/PF, stable 2/6 Musc: Right AKA stump with swelling. Good shape, minimally tender with palpation   Assessment/Plan: 1. Functional deficits which require 3+ hours per day of interdisciplinary therapy in a comprehensive inpatient rehab setting. Physiatrist is providing close team supervision and 24 hour management of active medical problems listed below. Physiatrist and rehab team continue to assess barriers to discharge/monitor patient progress toward functional and medical  goals  Care Tool:  Bathing    Body parts bathed by patient: Right arm, Left arm, Chest, Abdomen, Face, Left upper leg, Left lower leg, Front perineal area, Right upper leg   Body parts bathed by helper: Buttocks Body parts n/a: Right lower leg   Bathing assist Assist Level: Minimal Assistance - Patient > 75%     Upper Body Dressing/Undressing Upper body dressing   What is the patient wearing?: Pull over shirt    Upper body assist Assist Level: Moderate Assistance - Patient 50 - 74%    Lower Body Dressing/Undressing Lower body dressing      What is the patient wearing?: Pants     Lower body assist Assist for lower body dressing: Moderate Assistance - Patient 50 - 74%     Toileting Toileting    Toileting assist Assist for toileting: Total Assistance - Patient < 25%     Transfers Chair/bed transfer  Transfers assist     Chair/bed transfer assist level: Moderate Assistance - Patient 50 - 74%     Locomotion Ambulation   Ambulation assist   Ambulation activity did not occur: Safety/medical concerns          Walk 10 feet activity   Assist  Walk 10 feet activity did  not occur: Safety/medical concerns        Walk 50 feet activity   Assist Walk 50 feet with 2 turns activity did not occur: Safety/medical concerns         Walk 150 feet activity   Assist Walk 150 feet activity did not occur: Safety/medical concerns         Walk 10 feet on uneven surface  activity   Assist Walk 10 feet on uneven surfaces activity did not occur: Safety/medical concerns         Wheelchair     Assist Is the patient using a wheelchair?: Yes Type of Wheelchair: Manual    Wheelchair assist level: Minimal Assistance - Patient > 75% Max wheelchair distance: 45'    Wheelchair 50 feet with 2 turns activity    Assist        Assist Level: Minimal Assistance - Patient > 75%   Wheelchair 150 feet activity     Assist      Assist Level:  Maximal Assistance - Patient 25 - 49%   Blood pressure 121/76, pulse 74, temperature 97.6 F (36.4 C), temperature source Oral, resp. rate 19, height 5' 8 (1.727 m), weight 70 kg, SpO2 99%.  Medical Problem List and Plan: 1. 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  New onset afib/aflutter: continue Eliquis   3. Pain Management: conitnue Ultram  prn  4. Circadian rhythm dysfunction: melatonin d/ced as seems to be worsening agitation.  LCSW to follow for evaluation and support. 25mg  Seroquel  scheduled HS for agitation due to sundowning. Sleep chart d/ced  5. Neuropsych/cognition: This patient is capable of making decisions on his own behalf. 6. Skin/Wound Care: Pressure relief measures.  --  prevalon boot for  left is boggy. --pain pressure ares on left later foot and toes with betadine .   R- BKA                        Left lateral heel          Left toes.       7. Fluids/Electrolytes/Nutrition: Strict I/O. Continue Vitamin C .              --add protein supplement.   9. R-AKA due to PAD/gangrene/ischemia: Has appt for staple removal 02/12  10.  Hypoalbuminemia: continue protein supplement  11.  T2DM with neuropathy: Monitor BS ac/hs and use SSI for elevated BS             2/2 Restarted metformin  500mg  daily 2/1.     -monitor for pattern today    -a list of foods for diabetes was provided over weekend  CBG (last 3)  Recent Labs    02/22/24 2108 02/23/24 0528 02/23/24 1202  GLUCAP 178* 141* 159*    12. CAD s/p CABG: continue Lipitor, metoprolol  and Eliquis (off ASA)  13. Chronic systolic CHF/ICM w/EF: Monitor daily weight and for signs of overload.  --Strict I/O. --Continue Lipitor, Cozaar , Eliquis .  Continue to hold jardiance to promote wound healing.  Cardiology consulted given pleural effusions  on CXR, rising BUN with demadex  -weight reviewed and is decreased  14.  Interstitial lung disease w/chronic hypoxic RF: Recommendations to resume Cellcept after dc. continue Prednisone  30 mg and Bactrim  DS 3 X /wk             --Dependent of 5 L oxygen per [place] at baseline- continue  -pt with crackles at bases on exam--encourage IS, OOB  15. Leucocytosis: WBC reviewed and decreased, dicussed with wife  16. Pre-renal azotemia: Cr reviewed and is stable  17. Anemia of chronic disease: Hgb reviewed and is improving  18. GERD: continue PPI     LOS: 9 days A FACE TO FACE EVALUATION WAS PERFORMED  Reginald [name] 02/23/2024, 12:38 PM     "

## 2024-02-23 NOTE — Progress Notes (Signed)
 Occupational Therapy Session Note  Patient Details  Name: Reginald [name] MRN: [id number] Date of Birth: 1935-11-21  Today's Date: 02/23/2024 OT Individual Time: 1345-1415 OT Individual Time Calculation (min): 30 min    Short Term Goals: Week 2:  OT Short Term Goal 1 (Week 2): Patient will complete an active transfer to toilet with mod assist or less OT Short Term Goal 2 (Week 2): Patient will complete toileting with mod assist OT Short Term Goal 3 (Week 2): Patient will don elastic wasit pants with min assist Week 3:     Skilled Therapeutic Interventions/Progress Updates:    1:1 Pt recievedin the bed getting off the bed pan with nursing with no result. Offered pt to get on the [facility] and he reported never trying that before but when offered- pt declined reporting fatigue and that he is 89 years old and is tired today; he also declided bed exercises and out of bed activity due to the fatigue. Did discuss him wearing the shrinker and pt reports that as long as it is no doubled he is okay- no current discomfort..  Pt able to thread his pants on his right Le and pull them up by rolling in the bed. Pt assisted with pulling himself up with min A in the bed. Pillow placed behind him for more upright sitting. Pt sat up in the bed and shave himself with min A with encouragement.  Pt left in the bed to rest in prep for next session in a hr.   Therapy Documentation Precautions:  Precautions Precautions: Fall Precaution/Restrictions Comments: R AKA NWB; 5L O2 [place] (chronic ~1 yr) Restrictions Weight Bearing Restrictions Per Provider Order: Yes RLE Weight Bearing Per Provider Order: Non weight bearing  Pain: No reports of pain in session - just fatigue.   Therapy/Group: Individual Therapy  Claudene Nest [facility] 02/23/2024, 3:02 PM

## 2024-02-23 NOTE — Progress Notes (Signed)
 Occupational Therapy Session Note  Patient Details  Name: Reginald [name] MRN: [id number] Date of Birth: Jul 09, 1935  Today's Date: 02/23/2024 OT Individual Time: 1002-1046 OT Individual Time Calculation (min): 44 min    Short Term Goals: Week 2:  OT Short Term Goal 1 (Week 2): Patient will complete an active transfer to toilet with mod assist or less OT Short Term Goal 2 (Week 2): Patient will complete toileting with mod assist OT Short Term Goal 3 (Week 2): Patient will don elastic wasit pants with min assist  Skilled Therapeutic Interventions/Progress Updates:   Patient received supine in bed, awake and alert, and reports sleeping well last night.   [name] at bedside.   Patient agreeable to get dressed.  Worked on LB dressing.  Patient able to don pants over legs while seated at edge of bed.  Patient used lateral leaning to attempt to pull pants over hips.  Patient needing physical assistance to pull pants up over hips.   Worked on downhill transfer to wheelchair - mod assist toward left.  Patient unable to use slide board to transfer uphill from lower lightweight chair.  While patient is working on more independence with transfers - he may benefit from a higher chair.  Spoke with PT and switched out chair to higher chair for increase safety and independence with all transfers.   Therapy Documentation Precautions:  Precautions Precautions: Fall Precaution/Restrictions Comments: R AKA NWB; 5L O2  (chronic ~1 yr) Restrictions Weight Bearing Restrictions Per Provider Order: Yes RLE Weight Bearing Per Provider Order: Non weight bearing   Pain: Denies pain- wife indicates he reported pain from shrinker.  Today shrinker applied and not doubled onto itself.    Therapy/Group: Individual Therapy  Althea Backs M 02/23/2024, 1:17 PM

## 2024-02-23 NOTE — Evaluation (Signed)
 Recreational Therapy Assessment and Plan  Patient Details  Name: Reginald [name] MRN: [id number] Date of Birth: 1935/07/05 Today's Date: 02/23/2024 Late entry for 02/22/24  Rehab Potential:  Good ELOS:   d/c 2/17  Assessment Hospital Problem: Principal Problem:   Above-knee amputation of right lower extremity [facility])     Past Medical History:      Past Medical History:  Diagnosis Date   Arthritis     Complication of anesthesia      2014 BP DROPPED S/P BACK SURGERY REC. IV FLUIDS   Diabetes mellitus without complication (HCC)     Disc displacement, lumbar     Full dentures     GERD (gastroesophageal reflux disease)     Hearing deficit      does not wear H/A   History of kidney stones     Hypertension     Kidney stone     Neuromuscular disorder (HCC)     Pneumonia     Umbilical hernia     Wears glasses          Past Surgical History:       Past Surgical History:  Procedure Laterality Date   AMPUTATION Right 01/30/2024    Procedure: Right above the Knee AMPUTATION.;  Surgeon: Pearline Norman RAMAN, MD;  Location: [facility] OR;  Service: Vascular;  Laterality: Right;   BACK SURGERY       CATARACT EXTRACTION W/ INTRAOCULAR LENS  IMPLANT, BILATERAL       COLONOSCOPY W/ BIOPSIES AND POLYPECTOMY       LUMBAR LAMINECTOMY/DECOMPRESSION MICRODISCECTOMY Right 08/30/2016    Procedure: Right lumbar three-four microdiscectomy;  Surgeon: Mavis Purchase, MD;  Location: [facility] OR;  Service: Neurosurgery;  Laterality: Right;   MULTIPLE TOOTH EXTRACTIONS       NOSE SURGERY        deviated septum, and blockage   rottor cuff repair         left           Assessment & Plan Clinical Impression: Patient is a 89 year old male with history of T2DM, CAD s/p CABG, ICM LVEF -25%, severe PAD, pulmonary HTN- 5 L oxygen dependent, interstitial lung disease on Cellcept,  right foot infection treated with antibiotics but continued to worsen with gangrenous changes and was transferred from OSH to [facility] for management. He  was evaluated by Dr. Pearline and found to have severe tissue loss of entire forefoot with wet and dry gangrene with cellulitis up to mid calf. Comfort care v/s AKA discussed and family elected on surgical intervention. He underwent R-AKA on 01/13 and transferred to ICU due to high oxygen needs.  He completed 24 hours antibiotics post op and diuresed for acute on chronic systolic CHF.    Cardiology consulted for input on  A flutter with RVR and recommended rate control as well as Eliquis  for stroke prevention. 2 D echo done revealing Ef 20-25% with severe LVD, severe reduction in RVF with severe enlargement of RV, severe RA. He was started on IV heparin  and transitioned to Eliquis  and ASA d/c. He developed respiratory distress with tachypnea on 01/17 and started on IV lasix  bid to 40 mg qid for volume overload and medications adjusted. Respiratory status improved but did develop hypotension requiring IVF therefore diuretics d/c as now euvolemic. Leucocytosis being monitored and ABLA stable. Posterior ecchymosis right thigh being monitored. Cellcept on hold with recommendations to resume on outpatient basis.  Has altered sensation in left foot due to neuropathy.  Chronic double vision, has had prior workup. Has prism  glasses that help. PT/OT has been working with patient who requires max assist +2 for standing in [place] with strong right lean and difficulty extending left/knee trunk   Patient transferred to CIR on 02/14/2024 .    Pt presents with decreased activity tolerance, decreased functional mobility, decreased balance, feelings of stress Limiting pt's independence with leisure/community pursuits.  Per team referral for coping/stress management, met with pt briefly to discuss TR services on 02/22/24 & completed brief leisure assessment. Pt was also scheduled for 60 minute amputee support/education group but was unable to attend due to toileting needs.     Plan  Min 1 session >20 minutes during  LOS  Recommendations for other services: None   Discharge Criteria: Patient will be discharged from TR if patient refuses treatment 3 consecutive times without medical reason.  If treatment goals not met, if there is a change in medical status, if patient makes no progress towards goals or if patient is discharged from hospital.  The above assessment, treatment plan, treatment alternatives and goals were discussed and mutually agreed upon: by patient  Katrina Daddona 02/23/2024, 8:58 AM

## 2024-02-23 NOTE — Progress Notes (Signed)
 Patient with increase fatigue today. HR went up to 150 with PT and came down to 120 after back to room with recheck. Patient asymptomatic EKG done showing A flutter with HR of 98. Note that patient has loss 10 lbs in the past 3 days with lasix  on board and BUN trending up. Discussed patient with [place] PA with cardiology who recommended monitoring as its known and rate controlled w/plans for DCCV after dc. He is getting dehydrated--will decrease lasix  to 20 mg daily for now--no  hydration. Will recheck labs in am.

## 2024-02-23 NOTE — Progress Notes (Signed)
 Physical Therapy Weekly Progress Note  Patient Details  Name: Reginald [name] MRN: [id number] Date of Birth: Sep 14, 1935  Beginning of progress report period: February 15, 2024 End of progress report period: February 23, 2024  Today's Date: 02/23/2024 PT Individual Time: 8952-8854 + [id number] PT Individual Time Calculation (min): 58 min  (missed 17 minutes) + 56 min  Patient has met 1 of 3 short term goals.  Pt is making slow progress towards LTG of minA. He currently is CGA for bed mobility with hospital bed features, maxA for squat pivot transfers into wheelchair, and minA for wheelchair mobility. He is primarily limited by global weakness and deconditioning, cognitive impairments with sundowning and agitation, poor activity tolerance.   Patient continues to demonstrate the following deficits muscle weakness and muscle joint tightness, decreased cardiorespiratoy endurance, unbalanced muscle activation and motor apraxia, decreased initiation, decreased attention, decreased awareness, decreased problem solving, decreased safety awareness, decreased memory, and delayed processing, and decreased sitting balance, decreased standing balance, decreased postural control, and decreased balance strategies and therefore will continue to benefit from skilled PT intervention to increase functional independence with mobility.  Patient progressing toward long term goals..  Continue plan of care.  PT Short Term Goals Week 1:  PT Short Term Goal 1 (Week 1): Pt will perform bed mobility with CGA. PT Short Term Goal 1 - Progress (Week 1): Met PT Short Term Goal 2 (Week 1): Pt will complete sit to stand with modA. PT Short Term Goal 2 - Progress (Week 1): Not met PT Short Term Goal 3 (Week 1): Pt will complete bed to chair with modA consistently. PT Short Term Goal 3 - Progress (Week 1): Not met PT Short Term Goal 4 (Week 1): Pt will self propel WC x100' wiht BUEs and minA. PT Short Term Goal 4 - Progress (Week 1):  Progressing toward goal Week 2:  PT Short Term Goal 1 (Week 2): Pt will complete bed mobility with supervision PT Short Term Goal 2 (Week 2): Pt will complete bed<>chair transfers with modA and LRAD PT Short Term Goal 3 (Week 2): Pt will propel wheelchair at least 140ft with supervision  Skilled Therapeutic Interventions/Progress Updates:      1st session: Direct handoff of care from OT to start. Pt denies any pain but does endorse fatigue. On 4L portable O2.   Pt positioned in // bars. Worked on sit<>stands in // bars but unable to fully stand from height of wc despite maxA, only able to partially stand. Completed several partial stands with his L knee blocked. During this activity, patient reporting urgent need to void so returned to room. Due to urgency, used urinal with assist at w/c level, pt unable to void despite + time.   Pt practiced w/c mobility 75' at [facility] level to keep straight path, veering L more than R.   Pt participated in dance group at individual level, seated in w/c. Max cueing for participation and for following directions of leader. Assist for stretching and positioning throughout. Pt needing frequent rest breaks as he fatigues quickly.   Pt requesting to return back to bed to rest given his fatigue levels. Returned to room and completed maxA SB transfer from w/c to bed. MaxA for positioning into supine. Pt made comfortable, returned to wall O2 5L. He missed the last 15 minutes of session.    2nd session: Pt resting in bed on 5L wall O2 - spouse at bedside. Pt has no pain, still very fatigued but pt agreeable  to treatment.   Supine<>sitting EOB with CGA with bed features. Wife asking if bed height at home should be certain height - explained that this needs to be close to being level with height of wheelchair, around ~20-21. SB transfer completed with modA from deflated air bed to wheelchair. Transported to day room gym for time management. Practiced block practice SB  transfers from w/c to mat table with maxA to start, progressing to minA. Barrier being setup for board placement, get's caught on his shorts/pants and pt with limited lift off during transfer, requires several small & inefficient scoots to complete transfer. W/c mobility 2x30' with supervision - pt with improved ability to maintain straight path but continues to fatigue very quickly. Pt requesting to return to bed at end of session. HR noted to be elevated in 150's during treatment, stayed elevated up to 153bpm and did not settle with rest breaks. MD/LPN made aware via secure chat - pt asymptomatic. Ended treatment in bed with all needs met, bed alarm on and wife present.    Therapy Documentation Precautions:  Precautions Precautions: Fall Precaution/Restrictions Comments: R AKA NWB; 5L O2 [place] (chronic ~1 yr) Restrictions Weight Bearing Restrictions Per Provider Order: Yes RLE Weight Bearing Per Provider Order: Non weight bearing General:      Therapy/Group: Individual Therapy  Reginald [name] 02/23/2024, 7:50 AM

## 2024-02-29 ENCOUNTER — Ambulatory Visit
